# Patient Record
Sex: Female | Born: 1960 | Race: White | Hispanic: No | Smoking: Current every day smoker
Health system: Southern US, Community
[De-identification: ages and names within clinical notes are randomized; demographics above are authoritative.]

## PROBLEM LIST (undated history)

## (undated) DIAGNOSIS — M791 Myalgia, unspecified site: Secondary | ICD-10-CM

## (undated) DIAGNOSIS — S71131A Puncture wound without foreign body, right thigh, initial encounter: Secondary | ICD-10-CM

## (undated) DIAGNOSIS — F17200 Nicotine dependence, unspecified, uncomplicated: Secondary | ICD-10-CM

## (undated) DIAGNOSIS — R319 Hematuria, unspecified: Secondary | ICD-10-CM

## (undated) DIAGNOSIS — B019 Varicella without complication: Secondary | ICD-10-CM

## (undated) DIAGNOSIS — F329 Major depressive disorder, single episode, unspecified: Secondary | ICD-10-CM

## (undated) DIAGNOSIS — J019 Acute sinusitis, unspecified: Secondary | ICD-10-CM

## (undated) DIAGNOSIS — R946 Abnormal results of thyroid function studies: Secondary | ICD-10-CM

## (undated) DIAGNOSIS — M25551 Pain in right hip: Secondary | ICD-10-CM

## (undated) DIAGNOSIS — T7840XA Allergy, unspecified, initial encounter: Secondary | ICD-10-CM

## (undated) DIAGNOSIS — Z0000 Encounter for general adult medical examination without abnormal findings: Secondary | ICD-10-CM

## (undated) DIAGNOSIS — Z86010 Personal history of colonic polyps: Secondary | ICD-10-CM

## (undated) DIAGNOSIS — R51 Headache: Secondary | ICD-10-CM

## (undated) DIAGNOSIS — E785 Hyperlipidemia, unspecified: Secondary | ICD-10-CM

## (undated) DIAGNOSIS — F419 Anxiety disorder, unspecified: Secondary | ICD-10-CM

## (undated) DIAGNOSIS — C801 Malignant (primary) neoplasm, unspecified: Secondary | ICD-10-CM

## (undated) DIAGNOSIS — M542 Cervicalgia: Secondary | ICD-10-CM

## (undated) DIAGNOSIS — T148XXA Other injury of unspecified body region, initial encounter: Secondary | ICD-10-CM

## (undated) DIAGNOSIS — N200 Calculus of kidney: Secondary | ICD-10-CM

## (undated) DIAGNOSIS — I7300 Raynaud's syndrome without gangrene: Secondary | ICD-10-CM

## (undated) DIAGNOSIS — F32A Depression, unspecified: Secondary | ICD-10-CM

## (undated) DIAGNOSIS — L089 Local infection of the skin and subcutaneous tissue, unspecified: Secondary | ICD-10-CM

## (undated) DIAGNOSIS — F418 Other specified anxiety disorders: Secondary | ICD-10-CM

## (undated) HISTORY — DX: Headache: R51

## (undated) HISTORY — DX: Hematuria, unspecified: R31.9

## (undated) HISTORY — DX: Pain in right hip: M25.551

## (undated) HISTORY — DX: Personal history of colonic polyps: Z86.010

## (undated) HISTORY — DX: Depression, unspecified: F32.A

## (undated) HISTORY — DX: Encounter for general adult medical examination without abnormal findings: Z00.00

## (undated) HISTORY — DX: Puncture wound without foreign body, right thigh, initial encounter: S71.131A

## (undated) HISTORY — DX: Calculus of kidney: N20.0

## (undated) HISTORY — DX: Raynaud's syndrome without gangrene: I73.00

## (undated) HISTORY — DX: Major depressive disorder, single episode, unspecified: F32.9

## (undated) HISTORY — DX: Allergy, unspecified, initial encounter: T78.40XA

## (undated) HISTORY — DX: Anxiety disorder, unspecified: F41.9

## (undated) HISTORY — DX: Varicella without complication: B01.9

## (undated) HISTORY — DX: Other specified anxiety disorders: F41.8

## (undated) HISTORY — PX: POLYPECTOMY: SHX149

## (undated) HISTORY — PX: CARPAL TUNNEL RELEASE: SHX101

## (undated) HISTORY — DX: Acute sinusitis, unspecified: J01.90

## (undated) HISTORY — DX: Hyperlipidemia, unspecified: E78.5

## (undated) HISTORY — DX: Malignant (primary) neoplasm, unspecified: C80.1

## (undated) HISTORY — DX: Nicotine dependence, unspecified, uncomplicated: F17.200

## (undated) HISTORY — DX: Myalgia, unspecified site: M79.10

## (undated) HISTORY — PX: LITHOTRIPSY: SUR834

## (undated) HISTORY — DX: Cervicalgia: M54.2

## (undated) HISTORY — DX: Abnormal results of thyroid function studies: R94.6

## (undated) HISTORY — DX: Local infection of the skin and subcutaneous tissue, unspecified: L08.9

## (undated) HISTORY — DX: Other injury of unspecified body region, initial encounter: T14.8XXA

---

## 2000-08-10 ENCOUNTER — Encounter: Payer: Self-pay | Admitting: Urology

## 2000-08-12 ENCOUNTER — Encounter: Payer: Self-pay | Admitting: Urology

## 2000-08-12 ENCOUNTER — Ambulatory Visit (HOSPITAL_COMMUNITY): Admission: RE | Admit: 2000-08-12 | Discharge: 2000-08-12 | Payer: Self-pay | Admitting: Urology

## 2003-02-02 ENCOUNTER — Encounter: Payer: Self-pay | Admitting: Family Medicine

## 2003-02-02 ENCOUNTER — Encounter: Admission: RE | Admit: 2003-02-02 | Discharge: 2003-02-02 | Payer: Self-pay | Admitting: Family Medicine

## 2003-02-19 ENCOUNTER — Encounter: Admission: RE | Admit: 2003-02-19 | Discharge: 2003-02-19 | Payer: Self-pay | Admitting: Family Medicine

## 2003-02-19 ENCOUNTER — Encounter: Payer: Self-pay | Admitting: Family Medicine

## 2004-08-01 ENCOUNTER — Encounter: Admission: RE | Admit: 2004-08-01 | Discharge: 2004-08-01 | Payer: Self-pay | Admitting: Obstetrics and Gynecology

## 2004-08-01 ENCOUNTER — Other Ambulatory Visit: Admission: RE | Admit: 2004-08-01 | Discharge: 2004-08-01 | Payer: Self-pay | Admitting: Obstetrics and Gynecology

## 2004-12-11 ENCOUNTER — Ambulatory Visit (HOSPITAL_COMMUNITY): Admission: RE | Admit: 2004-12-11 | Discharge: 2004-12-11 | Payer: Self-pay | Admitting: Gastroenterology

## 2004-12-11 ENCOUNTER — Encounter (INDEPENDENT_AMBULATORY_CARE_PROVIDER_SITE_OTHER): Payer: Self-pay | Admitting: Specialist

## 2005-01-01 LAB — HM COLONOSCOPY

## 2005-10-02 ENCOUNTER — Encounter: Admission: RE | Admit: 2005-10-02 | Discharge: 2005-10-02 | Payer: Self-pay | Admitting: Obstetrics and Gynecology

## 2005-10-30 ENCOUNTER — Other Ambulatory Visit: Admission: RE | Admit: 2005-10-30 | Discharge: 2005-10-30 | Payer: Self-pay | Admitting: Obstetrics and Gynecology

## 2007-09-07 ENCOUNTER — Encounter: Admission: RE | Admit: 2007-09-07 | Discharge: 2007-09-07 | Payer: Self-pay | Admitting: Obstetrics and Gynecology

## 2007-10-04 ENCOUNTER — Other Ambulatory Visit: Admission: RE | Admit: 2007-10-04 | Discharge: 2007-10-04 | Payer: Self-pay | Admitting: Obstetrics & Gynecology

## 2008-06-06 ENCOUNTER — Encounter: Admission: RE | Admit: 2008-06-06 | Discharge: 2008-06-06 | Payer: Self-pay | Admitting: Otolaryngology

## 2008-10-31 ENCOUNTER — Encounter: Admission: RE | Admit: 2008-10-31 | Discharge: 2008-10-31 | Payer: Self-pay | Admitting: Obstetrics and Gynecology

## 2008-10-31 ENCOUNTER — Other Ambulatory Visit: Admission: RE | Admit: 2008-10-31 | Discharge: 2008-10-31 | Payer: Self-pay | Admitting: Obstetrics and Gynecology

## 2009-12-09 ENCOUNTER — Encounter: Admission: RE | Admit: 2009-12-09 | Discharge: 2009-12-09 | Payer: Self-pay | Admitting: Obstetrics and Gynecology

## 2009-12-26 ENCOUNTER — Ambulatory Visit (HOSPITAL_BASED_OUTPATIENT_CLINIC_OR_DEPARTMENT_OTHER): Admission: RE | Admit: 2009-12-26 | Discharge: 2009-12-26 | Payer: Self-pay | Admitting: Orthopedic Surgery

## 2010-02-18 ENCOUNTER — Encounter (INDEPENDENT_AMBULATORY_CARE_PROVIDER_SITE_OTHER): Payer: Self-pay | Admitting: *Deleted

## 2010-06-19 ENCOUNTER — Ambulatory Visit (HOSPITAL_BASED_OUTPATIENT_CLINIC_OR_DEPARTMENT_OTHER): Admission: RE | Admit: 2010-06-19 | Discharge: 2010-06-19 | Payer: Self-pay | Admitting: Orthopedic Surgery

## 2010-09-02 NOTE — Letter (Signed)
Summary: Previsit letter  [facility]  [address] [place], [place] [address]   Phone: [phone]  Fax: [phone]       02/18/2010 MRN: [id number]  Autumn Myers [address] [place], [place]  [address]  Dear [name],  Welcome to the Gastroenterology Division at [facility].    You are scheduled to see a nurse for your pre-procedure visit on 04/11/2010 at 4:00pm on the 3rd floor at [facility], [address]  We ask that you try to arrive at our office 15 minutes prior to your appointment time to allow for check-in.  Your nurse visit will consist of discussing your medical and surgical history, your immediate family medical history, and your medications.    Please bring a complete list of all your medications or, if you prefer, bring the medication bottles and we will list them.  We will need to be aware of both prescribed and over the counter drugs.  We will need to know exact dosage information as well.  If you are on blood thinners (Coumadin, Plavix, Aggrenox, Ticlid, etc.) please call our office today/prior to your appointment, as we need to consult with your physician about holding your medication.   Please be prepared to read and sign documents such as consent forms, a financial agreement, and acknowledgement forms.  If necessary, and with your consent, a friend or relative is welcome to sit-in on the nurse visit with you.  Please bring your insurance card so that we may make a copy of it.  If your insurance requires a referral to see a specialist, please bring your referral form from your primary care physician.  No co-pay is required for this nurse visit.     If you cannot keep your appointment, please call [phone] to cancel or reschedule prior to your appointment date.  This allows [place] the opportunity to schedule an appointment for another patient in need of care.    Thank you for choosing [facility] for your medical  needs.  We appreciate the opportunity to care for you.  Please visit [place] at our website  to learn more about our practice.                     Sincerely.                                                                                                                   The Gastroenterology Division

## 2010-10-20 LAB — POCT HEMOGLOBIN-HEMACUE: Hemoglobin: 14.2 g/dL (ref 12.0–15.0)

## 2010-12-19 NOTE — Op Note (Signed)
NAME:  Autumn Myers, Autumn Myers NO.:  [id number]   MEDICAL RECORD NO.:  [id number]          PATIENT TYPE:  AMB   LOCATION:  ENDO                         FACILITY:  [facility]   PHYSICIAN:  Graylin Shiver, M.D.   DATE OF BIRTH:  04/12/1961   DATE OF PROCEDURE:  12/11/2004  DATE OF DISCHARGE:                                 OPERATIVE REPORT   PROCEDURE:  Esophagogastroduodenoscopy with biopsy.   ENDOSCOPIST:  Graylin Shiver, M.D.   INDICATIONS:  Epigastric pain.  Multiple tests have been done which have  been unrevealing. Endoscopy is being done to further evaluate.   INFORMED CONSENT:  Obtained after explanation of the risks of bleeding,  infection, and perforation.   PREMEDICATION:  Fentanyl 75 mcg intravenously, Versed 7.5 mg intravenously.   PROCEDURE IN DETAIL:  With the patient in the left lateral decubitus  position, the Olympus gastroscope was inserted into the oropharynx and  passed into the esophagus. It was advanced down the esophagus, then into the  stomach and into the duodenum. The second portion and bulb of the duodenum  looked normal. The stomach showed a diffuse, mild erythematous appearance to  the mucosa compatible with gastritis. No ulcers or erosions were seen.  Biopsies were obtained from the stomach for histology. No lesions were seen  in the fundus or cardia on retroflexion. The esophagus looked normal. The  esophagogastric junction was at 37 cm. She tolerated the procedure well  without complications.   IMPRESSION:  Gastritis.   PLAN:  The biopsies will be checked. If Helicobacter pylori is present, it  will be treated.      SFG/MEDQ  D:  12/11/2004  T:  12/11/2004  Job:  [id number]

## 2010-12-26 ENCOUNTER — Other Ambulatory Visit: Payer: Self-pay | Admitting: Obstetrics and Gynecology

## 2010-12-26 DIAGNOSIS — Z1231 Encounter for screening mammogram for malignant neoplasm of breast: Secondary | ICD-10-CM

## 2011-01-02 LAB — HM MAMMOGRAPHY

## 2011-01-19 ENCOUNTER — Other Ambulatory Visit: Payer: Self-pay | Admitting: Dermatology

## 2011-01-26 ENCOUNTER — Ambulatory Visit
Admission: RE | Admit: 2011-01-26 | Discharge: 2011-01-26 | Disposition: A | Discharge status: Home / Self Care | Payer: BC Managed Care – PPO | Source: Ambulatory Visit | Attending: Obstetrics and Gynecology | Admitting: Obstetrics and Gynecology

## 2011-01-26 DIAGNOSIS — Z1231 Encounter for screening mammogram for malignant neoplasm of breast: Secondary | ICD-10-CM

## 2011-05-15 ENCOUNTER — Inpatient Hospital Stay (INDEPENDENT_AMBULATORY_CARE_PROVIDER_SITE_OTHER)
Admission: RE | Admit: 2011-05-15 | Discharge: 2011-05-15 | Disposition: A | Discharge status: Home / Self Care | Payer: Self-pay | Source: Ambulatory Visit | Attending: Family Medicine | Admitting: Family Medicine

## 2011-05-15 ENCOUNTER — Encounter: Payer: Self-pay | Admitting: Family Medicine

## 2011-05-15 DIAGNOSIS — Z202 Contact with and (suspected) exposure to infections with a predominantly sexual mode of transmission: Secondary | ICD-10-CM

## 2011-05-15 DIAGNOSIS — S60229A Contusion of unspecified hand, initial encounter: Secondary | ICD-10-CM

## 2011-07-06 NOTE — Progress Notes (Signed)
Summary: POSS TRICHOMONAS...WSE (room 4)   Vital Signs:  Patient Profile:   50 Years Old Female CC:      test for Trichomoniasis Height:     61 inches Weight:      106 pounds O2 Sat:      100 % O2 treatment:    Room Air Temp:     98.1 degrees F oral Pulse rate:   78 / minute Resp:     16 per minute BP sitting:   117 / 78  (left arm)  Pt. in pain?   no  Vitals Entered By: Lavell Islam RN (May 15, 2011 5:16 PM)                   Updated Prior Medication List: * CITALOPRAM HYDROBROMIDE 20 MG TABS (CITALOPRAM HYDROBROMIDE)  VIIBRYD 10 MG TABS (VILAZODONE HCL)  CYCLOBENZAPRINE HCL 10 MG TABS (CYCLOBENZAPRINE HCL) 2-3 times per day * GABAPENTIN 100 MG/5ML SOLN (GABAPENTIN) 1-2 3-4 times per day * VITAMIN D daily  Current Allergies: ! PCN ! * SHELLFISH, ! * NEOSPHistory of Present Illness Chief Complaint: test for Trichomoniasis History of Present Illness:  Subjective:  Patient presents with two problems:   1)  Her new sexual partner has just been diagnosed with trichomonas.  Other STD tests were negative.  She has no symptoms whatsoever, but would like to be treated. 2)  She bumped the dorsum of her right hand 2 days ago and has persistent mild swelling.  She has also had mild tingling in her right 4th and 5th fingers.  REVIEW OF SYSTEMS Constitutional Symptoms      Denies fever, chills, night sweats, weight loss, weight gain, and fatigue.  Eyes       Denies change in vision, eye pain, eye discharge, glasses, contact lenses, and eye surgery. Ear/Nose/Throat/Mouth       Denies hearing loss/aids, change in hearing, ear pain, ear discharge, dizziness, frequent runny nose, frequent nose bleeds, sinus problems, sore throat, hoarseness, and tooth pain or bleeding.  Respiratory       Denies dry cough, productive cough, wheezing, shortness of breath, asthma, bronchitis, and emphysema/COPD.  Cardiovascular       Denies murmurs, chest pain, and tires easily with exhertion.     Gastrointestinal       Denies stomach pain, nausea/vomiting, diarrhea, constipation, blood in bowel movements, and indigestion. Genitourniary       Denies painful urination, kidney stones, and loss of urinary control. Neurological       Denies paralysis, seizures, and fainting/blackouts. Musculoskeletal       Denies muscle pain, joint pain, joint stiffness, decreased range of motion, redness, swelling, muscle weakness, and gout.  Skin       Denies bruising, unusual mles/lumps or sores, and hair/skin or nail changes.  Psych       Denies mood changes, temper/anger issues, anxiety/stress, speech problems, depression, and sleep problems. Other Comments: asymptomatic; desires test for Trich   Past History:  Past Medical History: back pain  Past Surgical History: carpal tunnel both wrist  Family History: unremarkable  Social History: Current Smoker Alcohol use-no Drug use-no Smoking Status:  current Drug Use:  no   Objective:  Appearance:  Patient appears healthy, stated age, and in no acute distress  Right wrist and hand:  full range of motion all joints.  The dorsum of hand has mild swelling and resolving ecchymosis.  Minimal tenderness.  No deformity.  All fingers have full range of motion.  Distal neurovascular intact  Assessment New Problems: CONTUSION, RIGHT HAND (ICD-923.20) SEXUALLY TRANSMITTED DISEASE, EXPOSURE TO (ICD-V01.6)   Plan New Medications/Changes: METRONIDAZOLE 500 MG TABS (METRONIDAZOLE) One by mouth bid  #14 x 0, 05/15/2011, Donna Christen MD  New Orders: New Patient Level III [phone] Ace  Bandage < 3in. [J8119] Planning Comments:   Begin Flagyl for one week. Ace wrap applied to right hand and wear for several days until swelling resolved.  Continue applying ice pack several times daily.  Begin hand range of motion exercises when improved.  If pain in hand persists one week, return for x-ray.   The patient and/or caregiver has been counseled  thoroughly with regard to medications prescribed including dosage, schedule, interactions, rationale for use, and possible side effects and they verbalize understanding.  Diagnoses and expected course of recovery discussed and will return if not improved as expected or if the condition worsens. Patient and/or caregiver verbalized understanding.  Prescriptions: METRONIDAZOLE 500 MG TABS (METRONIDAZOLE) One by mouth bid  #14 x 0   Entered and Authorized by:   Donna Christen MD   Signed by:   Donna Christen MD on 05/15/2011   Method used:   Print then Give to Patient   RxID:   [id number]   Orders Added: 1)  New Patient Level III [84696] 2)  Ace  Bandage < 3in. [E9528]

## 2011-10-13 ENCOUNTER — Encounter: Payer: Self-pay | Admitting: Family Medicine

## 2011-10-13 ENCOUNTER — Ambulatory Visit (INDEPENDENT_AMBULATORY_CARE_PROVIDER_SITE_OTHER): Payer: BC Managed Care – PPO | Admitting: Family Medicine

## 2011-10-13 VITALS — BP 98/63 | HR 68 | Temp 98.8°F | Ht 61.0 in | Wt 115.4 lb

## 2011-10-13 DIAGNOSIS — E559 Vitamin D deficiency, unspecified: Secondary | ICD-10-CM | POA: Insufficient documentation

## 2011-10-13 DIAGNOSIS — F418 Other specified anxiety disorders: Secondary | ICD-10-CM

## 2011-10-13 DIAGNOSIS — F341 Dysthymic disorder: Secondary | ICD-10-CM

## 2011-10-13 DIAGNOSIS — Z0000 Encounter for general adult medical examination without abnormal findings: Secondary | ICD-10-CM | POA: Insufficient documentation

## 2011-10-13 DIAGNOSIS — F17200 Nicotine dependence, unspecified, uncomplicated: Secondary | ICD-10-CM | POA: Insufficient documentation

## 2011-10-13 DIAGNOSIS — T7840XA Allergy, unspecified, initial encounter: Secondary | ICD-10-CM

## 2011-10-13 DIAGNOSIS — F419 Anxiety disorder, unspecified: Secondary | ICD-10-CM | POA: Insufficient documentation

## 2011-10-13 HISTORY — DX: Encounter for general adult medical examination without abnormal findings: Z00.00

## 2011-10-13 MED ORDER — LORATADINE 10 MG PO TABS: 10.0000 mg | ORAL_TABLET | Freq: Every day | ORAL | Status: DC

## 2011-10-13 NOTE — Assessment & Plan Note (Signed)
Request old records. Will repeat labs as needed follows with an ob/gyn but can switch care here if she chooses

## 2011-10-13 NOTE — Assessment & Plan Note (Signed)
Encouraged complete cessation, patient willing to start trying, has used ecig in past with some success

## 2011-10-13 NOTE — Patient Instructions (Signed)
Preventive Care for Adults, Female A healthy lifestyle and preventive care can promote health and wellness. Preventive health guidelines for women include the following key practices.  A routine yearly physical is a good way to check with your caregiver about your health and preventive screening. It is a chance to share any concerns and updates on your health, and to receive a thorough exam.   Visit your dentist for a routine exam and preventive care every 6 months. Brush your teeth twice a day and floss once a day. Good oral hygiene prevents tooth decay and gum disease.   The frequency of eye exams is based on your age, health, family medical history, use of contact lenses, and other factors. Follow your caregiver's recommendations for frequency of eye exams.   Eat a healthy diet. Foods like vegetables, fruits, whole grains, low-fat dairy products, and lean protein foods contain the nutrients you need without too many calories. Decrease your intake of foods high in solid fats, added sugars, and salt. Eat the right amount of calories for you.Get information about a proper diet from your caregiver, if necessary.   Regular physical exercise is one of the most important things you can do for your health. Most adults should get at least 150 minutes of moderate-intensity exercise (any activity that increases your heart rate and causes you to sweat) each week. In addition, most adults need muscle-strengthening exercises on 2 or more days a week.   Maintain a healthy weight. The body mass index (BMI) is a screening tool to identify possible weight problems. It provides an estimate of body fat based on height and weight. Your caregiver can help determine your BMI, and can help you achieve or maintain a healthy weight.For adults 20 years and older:   A BMI below 18.5 is considered underweight.   A BMI of 18.5 to 24.9 is normal.   A BMI of 25 to 29.9 is considered overweight.   A BMI of 30 and above is  considered obese.   Maintain normal blood lipids and cholesterol levels by exercising and minimizing your intake of saturated fat. Eat a balanced diet with plenty of fruit and vegetables. Blood tests for lipids and cholesterol should begin at age 20 and be repeated every 5 years. If your lipid or cholesterol levels are high, you are over 50, or you are at high risk for heart disease, you may need your cholesterol levels checked more frequently.Ongoing high lipid and cholesterol levels should be treated with medicines if diet and exercise are not effective.   If you smoke, find out from your caregiver how to quit. If you do not use tobacco, do not start.   If you are pregnant, do not drink alcohol. If you are breastfeeding, be very cautious about drinking alcohol. If you are not pregnant and choose to drink alcohol, do not exceed 1 drink per day. One drink is considered to be 12 ounces (355 mL) of beer, 5 ounces (148 mL) of wine, or 1.5 ounces (44 mL) of liquor.   Avoid use of street drugs. Do not share needles with anyone. Ask for help if you need support or instructions about stopping the use of drugs.   High blood pressure causes heart disease and increases the risk of stroke. Your blood pressure should be checked at least every 1 to 2 years. Ongoing high blood pressure should be treated with medicines if weight loss and exercise are not effective.   If you are 55 to 50   years old, ask your caregiver if you should take aspirin to prevent strokes.   Diabetes screening involves taking a blood sample to check your fasting blood sugar level. This should be done once every 3 years, after age 45, if you are within normal weight and without risk factors for diabetes. Testing should be considered at a younger age or be carried out more frequently if you are overweight and have at least 1 risk factor for diabetes.   Breast cancer screening is essential preventive care for women. You should practice "breast  self-awareness." This means understanding the normal appearance and feel of your breasts and may include breast self-examination. Any changes detected, no matter how small, should be reported to a caregiver. Women in their 20s and 30s should have a clinical breast exam (CBE) by a caregiver as part of a regular health exam every 1 to 3 years. After age 40, women should have a CBE every year. Starting at age 40, women should consider having a mammography (breast X-ray test) every year. Women who have a family history of breast cancer should talk to their caregiver about genetic screening. Women at a high risk of breast cancer should talk to their caregivers about having magnetic resonance imaging (MRI) and a mammography every year.   The Pap test is a screening test for cervical cancer. A Pap test can show cell changes on the cervix that might become cervical cancer if left untreated. A Pap test is a procedure in which cells are obtained and examined from the lower end of the uterus (cervix).   Women should have a Pap test starting at age 21.   Between ages 21 and 29, Pap tests should be repeated every 2 years.   Beginning at age 30, you should have a Pap test every 3 years as long as the past 3 Pap tests have been normal.   Some women have medical problems that increase the chance of getting cervical cancer. Talk to your caregiver about these problems. It is especially important to talk to your caregiver if a new problem develops soon after your last Pap test. In these cases, your caregiver may recommend more frequent screening and Pap tests.   The above recommendations are the same for women who have or have not gotten the vaccine for human papillomavirus (HPV).   If you had a hysterectomy for a problem that was not cancer or a condition that could lead to cancer, then you no longer need Pap tests. Even if you no longer need a Pap test, a regular exam is a good idea to make sure no other problems are  starting.   If you are between ages 65 and 70, and you have had normal Pap tests going back 10 years, you no longer need Pap tests. Even if you no longer need a Pap test, a regular exam is a good idea to make sure no other problems are starting.   If you have had past treatment for cervical cancer or a condition that could lead to cancer, you need Pap tests and screening for cancer for at least 20 years after your treatment.   If Pap tests have been discontinued, risk factors (such as a new sexual partner) need to be reassessed to determine if screening should be resumed.   The HPV test is an additional test that may be used for cervical cancer screening. The HPV test looks for the virus that can cause the cell changes on the cervix.   The cells collected during the Pap test can be tested for HPV. The HPV test could be used to screen women aged 30 years and older, and should be used in women of any age who have unclear Pap test results. After the age of 30, women should have HPV testing at the same frequency as a Pap test.   Colorectal cancer can be detected and often prevented. Most routine colorectal cancer screening begins at the age of 50 and continues through age 75. However, your caregiver may recommend screening at an earlier age if you have risk factors for colon cancer. On a yearly basis, your caregiver may provide home test kits to check for hidden blood in the stool. Use of a small camera at the end of a tube, to directly examine the colon (sigmoidoscopy or colonoscopy), can detect the earliest forms of colorectal cancer. Talk to your caregiver about this at age 50, when routine screening begins. Direct examination of the colon should be repeated every 5 to 10 years through age 75, unless early forms of pre-cancerous polyps or small growths are found.   Hepatitis C blood testing is recommended for all people born from 1945 through 1965 and any individual with known risks for hepatitis C.    Practice safe sex. Use condoms and avoid high-risk sexual practices to reduce the spread of sexually transmitted infections (STIs). STIs include gonorrhea, chlamydia, syphilis, trichomonas, herpes, HPV, and human immunodeficiency virus (HIV). Herpes, HIV, and HPV are viral illnesses that have no cure. They can result in disability, cancer, and death. Sexually active women aged 25 and younger should be checked for chlamydia. Older women with new or multiple partners should also be tested for chlamydia. Testing for other STIs is recommended if you are sexually active and at increased risk.   Osteoporosis is a disease in which the bones lose minerals and strength with aging. This can result in serious bone fractures. The risk of osteoporosis can be identified using a bone density scan. Women ages 65 and over and women at risk for fractures or osteoporosis should discuss screening with their caregivers. Ask your caregiver whether you should take a calcium supplement or vitamin D to reduce the rate of osteoporosis.   Menopause can be associated with physical symptoms and risks. Hormone replacement therapy is available to decrease symptoms and risks. You should talk to your caregiver about whether hormone replacement therapy is right for you.   Use sunscreen with sun protection factor (SPF) of 30 or more. Apply sunscreen liberally and repeatedly throughout the day. You should seek shade when your shadow is shorter than you. Protect yourself by wearing long sleeves, pants, a wide-brimmed hat, and sunglasses year round, whenever you are outdoors.   Once a month, do a whole body skin exam, using a mirror to look at the skin on your back. Notify your caregiver of new moles, moles that have irregular borders, moles that are larger than a pencil eraser, or moles that have changed in shape or color.   Stay current with required immunizations.   Influenza. You need a dose every fall (or winter). The composition of  the flu vaccine changes each year, so being vaccinated once is not enough.   Pneumococcal polysaccharide. You need 1 to 2 doses if you smoke cigarettes or if you have certain chronic medical conditions. You need 1 dose at age 65 (or older) if you have never been vaccinated.   Tetanus, diphtheria, pertussis (Tdap, Td). Get 1 dose of   Tdap vaccine if you are younger than age 65, are over 65 and have contact with an infant, are a healthcare worker, are pregnant, or simply want to be protected from whooping cough. After that, you need a Td booster dose every 10 years. Consult your caregiver if you have not had at least 3 tetanus and diphtheria-containing shots sometime in your life or have a deep or dirty wound.   HPV. You need this vaccine if you are a woman age 26 or younger. The vaccine is given in 3 doses over 6 months.   Measles, mumps, rubella (MMR). You need at least 1 dose of MMR if you were born in 1957 or later. You may also need a second dose.   Meningococcal. If you are age 19 to 21 and a first-year college student living in a residence hall, or have one of several medical conditions, you need to get vaccinated against meningococcal disease. You may also need additional booster doses.   Zoster (shingles). If you are age 60 or older, you should get this vaccine.   Varicella (chickenpox). If you have never had chickenpox or you were vaccinated but received only 1 dose, talk to your caregiver to find out if you need this vaccine.   Hepatitis A. You need this vaccine if you have a specific risk factor for hepatitis A virus infection or you simply wish to be protected from this disease. The vaccine is usually given as 2 doses, 6 to 18 months apart.   Hepatitis B. You need this vaccine if you have a specific risk factor for hepatitis B virus infection or you simply wish to be protected from this disease. The vaccine is given in 3 doses, usually over 6 months.  Preventive Services /  Frequency Ages 19 to 39  Blood pressure check.** / Every 1 to 2 years.   Lipid and cholesterol check.** / Every 5 years beginning at age 20.   Clinical breast exam.** / Every 3 years for women in their 20s and 30s.   Pap test.** / Every 2 years from ages 21 through 29. Every 3 years starting at age 30 through age 65 or 70 with a history of 3 consecutive normal Pap tests.   HPV screening.** / Every 3 years from ages 30 through ages 65 to 70 with a history of 3 consecutive normal Pap tests.   Hepatitis C blood test.** / For any individual with known risks for hepatitis C.   Skin self-exam. / Monthly.   Influenza immunization.** / Every year.   Pneumococcal polysaccharide immunization.** / 1 to 2 doses if you smoke cigarettes or if you have certain chronic medical conditions.   Tetanus, diphtheria, pertussis (Tdap, Td) immunization. / A one-time dose of Tdap vaccine. After that, you need a Td booster dose every 10 years.   HPV immunization. / 3 doses over 6 months, if you are 26 and younger.   Measles, mumps, rubella (MMR) immunization. / You need at least 1 dose of MMR if you were born in 1957 or later. You may also need a second dose.   Meningococcal immunization. / 1 dose if you are age 19 to 21 and a first-year college student living in a residence hall, or have one of several medical conditions, you need to get vaccinated against meningococcal disease. You may also need additional booster doses.   Varicella immunization.** / Consult your caregiver.   Hepatitis A immunization.** / Consult your caregiver. 2 doses, 6 to 18 months   apart.   Hepatitis B immunization.** / Consult your caregiver. 3 doses usually over 6 months.  Ages 40 to 64  Blood pressure check.** / Every 1 to 2 years.   Lipid and cholesterol check.** / Every 5 years beginning at age 20.   Clinical breast exam.** / Every year after age 40.   Mammogram.** / Every year beginning at age 40 and continuing for as  long as you are in good health. Consult with your caregiver.   Pap test.** / Every 3 years starting at age 30 through age 65 or 70 with a history of 3 consecutive normal Pap tests.   HPV screening.** / Every 3 years from ages 30 through ages 65 to 70 with a history of 3 consecutive normal Pap tests.   Fecal occult blood test (FOBT) of stool. / Every year beginning at age 50 and continuing until age 75. You may not need to do this test if you get a colonoscopy every 10 years.   Flexible sigmoidoscopy or colonoscopy.** / Every 5 years for a flexible sigmoidoscopy or every 10 years for a colonoscopy beginning at age 50 and continuing until age 75.   Hepatitis C blood test.** / For all people born from 1945 through 1965 and any individual with known risks for hepatitis C.   Skin self-exam. / Monthly.   Influenza immunization.** / Every year.   Pneumococcal polysaccharide immunization.** / 1 to 2 doses if you smoke cigarettes or if you have certain chronic medical conditions.   Tetanus, diphtheria, pertussis (Tdap, Td) immunization.** / A one-time dose of Tdap vaccine. After that, you need a Td booster dose every 10 years.   Measles, mumps, rubella (MMR) immunization. / You need at least 1 dose of MMR if you were born in 1957 or later. You may also need a second dose.   Varicella immunization.** / Consult your caregiver.   Meningococcal immunization.** / Consult your caregiver.   Hepatitis A immunization.** / Consult your caregiver. 2 doses, 6 to 18 months apart.   Hepatitis B immunization.** / Consult your caregiver. 3 doses, usually over 6 months.  Ages 65 and over  Blood pressure check.** / Every 1 to 2 years.   Lipid and cholesterol check.** / Every 5 years beginning at age 20.   Clinical breast exam.** / Every year after age 40.   Mammogram.** / Every year beginning at age 40 and continuing for as long as you are in good health. Consult with your caregiver.   Pap test.** /  Every 3 years starting at age 30 through age 65 or 70 with a 3 consecutive normal Pap tests. Testing can be stopped between 65 and 70 with 3 consecutive normal Pap tests and no abnormal Pap or HPV tests in the past 10 years.   HPV screening.** / Every 3 years from ages 30 through ages 65 or 70 with a history of 3 consecutive normal Pap tests. Testing can be stopped between 65 and 70 with 3 consecutive normal Pap tests and no abnormal Pap or HPV tests in the past 10 years.   Fecal occult blood test (FOBT) of stool. / Every year beginning at age 50 and continuing until age 75. You may not need to do this test if you get a colonoscopy every 10 years.   Flexible sigmoidoscopy or colonoscopy.** / Every 5 years for a flexible sigmoidoscopy or every 10 years for a colonoscopy beginning at age 50 and continuing until age 75.   Hepatitis   C blood test.** / For all people born from 1945 through 1965 and any individual with known risks for hepatitis C.   Osteoporosis screening.** / A one-time screening for women ages 65 and over and women at risk for fractures or osteoporosis.   Skin self-exam. / Monthly.   Influenza immunization.** / Every year.   Pneumococcal polysaccharide immunization.** / 1 dose at age 65 (or older) if you have never been vaccinated.   Tetanus, diphtheria, pertussis (Tdap, Td) immunization. / A one-time dose of Tdap vaccine if you are over 65 and have contact with an infant, are a healthcare worker, or simply want to be protected from whooping cough. After that, you need a Td booster dose every 10 years.   Varicella immunization.** / Consult your caregiver.   Meningococcal immunization.** / Consult your caregiver.   Hepatitis A immunization.** / Consult your caregiver. 2 doses, 6 to 18 months apart.   Hepatitis B immunization.** / Check with your caregiver. 3 doses, usually over 6 months.  ** Family history and personal history of risk and conditions may change your caregiver's  recommendations. Document Released: 09/15/2001 Document Revised: 07/09/2011 Document Reviewed: 12/15/2010 [employer] Patient Information 2012 [employer]. 

## 2011-10-13 NOTE — Assessment & Plan Note (Signed)
Request old records and continue current dosing of meds for now

## 2011-10-13 NOTE — Assessment & Plan Note (Signed)
Uses Gabapentin prn for anxiety attacks with good effect roughly once daily. Is happy with Viibryd except it causes diarrhea within an hour of taking it, try Imodium and fiber prn and consider changing back to Celexa which she has tolerated in the past if no improvement

## 2011-10-13 NOTE — Progress Notes (Signed)
Patient ID: Autumn Myers, female   DOB: 05-31-1961, 51 y.o.   MRN: [id number] LIDIA CLAVIJO [id number] [birth year] 10/13/2011      Progress Note New Patient  Subjective  Chief Complaint  Chief Complaint  Patient presents with  . Establish Care    new patient    HPI  Is a 51 year old Caucasian female who is in today for new patient appointment. She is febrile here with a divorce, weight loss and a lot of stressors. She has been seeing a psychiatrist Dr. Clovis Riley who has helped her to get her rings and depression under control but now she is leaving her practice and she needs a primary care doctor. Otherwise the patient reports she's generally doing all right. She had a bad root canal in her left jaw and she continues to have pain in her jaw as a result. She had a lot of vertigo and frequent meclizine and Zofran after the procedure but reports that is all improved now.  Past Medical History  Diagnosis Date  . Allergy   . Anxiety   . Depression   . Chicken pox as a child  . Vitamin d deficiency   . Smoker   . Depression with anxiety   . Preventative health care 10/13/2011    Past Surgical History  Procedure Date  . Carpal tunnel release     on right and left  . Cesarean section 1994    Family History  Problem Relation Age of Onset  . Hypertension Mother   . Stroke Father   . Leukemia Father   . Hypertension Father   . Cancer Father     leukemia  . Depression Sister   . Anxiety disorder Sister   . Heart disease Maternal Grandfather     History   Social History  . Marital Status: Legally Separated    Spouse Name: N/A    Number of Children: N/A  . Years of Education: N/A   Occupational History  . Not on file.   Social History Main Topics  . Smoking status: Current Everyday Smoker -- 1.0 packs/day for 15 years    Types: Cigarettes  . Smokeless tobacco: Never Used  . Alcohol Use: No  . Drug Use: No  . Sexually Active: Not Currently   Other Topics  Concern  . Not on file   Social History Narrative  . No narrative on file    No current outpatient prescriptions on file prior to visit.    Allergies  Allergen Reactions  . Neosporin (Triple Antibiotic)     Inflamed, itchy   . Penicillins     Review of Systems  Review of Systems  Constitutional: Negative for fever and malaise/fatigue.  HENT: Negative for congestion.   Eyes: Negative for discharge.  Respiratory: Negative for shortness of breath.   Cardiovascular: Negative for chest pain, palpitations and leg swelling.  Gastrointestinal: Negative for nausea, abdominal pain and diarrhea.  Genitourinary: Negative for dysuria.  Musculoskeletal: Negative for falls.  Skin: Negative for rash.  Neurological: Negative for loss of consciousness and headaches.  Endo/Heme/Allergies: Negative for polydipsia.  Psychiatric/Behavioral: Positive for depression. Negative for suicidal ideas. The patient is nervous/anxious. The patient does not have insomnia.     Objective  BP 98/63  Pulse 68  Temp(Src) 98.8 F (37.1 C) (Temporal)  Ht 5\' 1"  (1.549 m)  Wt 115 lb 6.4 oz (52.345 kg)  BMI 21.80 kg/m2  SpO2 99%  Physical Exam  Physical Exam  Constitutional: She  is oriented to person, place, and time and well-developed, well-nourished, and in no distress. No distress.  HENT:  Head: Normocephalic and atraumatic.  Right Ear: External ear normal.  Left Ear: External ear normal.  Nose: Nose normal.  Mouth/Throat: Oropharynx is clear and moist. No oropharyngeal exudate.  Eyes: Conjunctivae are normal. Pupils are equal, round, and reactive to light. Right eye exhibits no discharge. Left eye exhibits no discharge. No scleral icterus.  Neck: Normal range of motion. Neck supple. No thyromegaly present.  Cardiovascular: Normal rate, regular rhythm, normal heart sounds and intact distal pulses.   No murmur heard. Pulmonary/Chest: Effort normal and breath sounds normal. No respiratory distress.  She has no wheezes. She has no rales.  Abdominal: Soft. Bowel sounds are normal. She exhibits no distension and no mass. There is no tenderness.  Musculoskeletal: Normal range of motion. She exhibits no edema and no tenderness.  Lymphadenopathy:    She has no cervical adenopathy.  Neurological: She is alert and oriented to person, place, and time. She has normal reflexes. No cranial nerve deficit. Coordination normal.  Skin: Skin is warm and dry. No rash noted. She is not diaphoretic.  Psychiatric: Mood, memory and affect normal.       Assessment & Plan  Vitamin d deficiency Request old records and continue current dosing of meds for now  Depression with anxiety Uses Gabapentin prn for anxiety attacks with good effect roughly once daily. Is happy with Viibryd except it causes diarrhea within an hour of taking it, try Imodium and fiber prn and consider changing back to Celexa which she has tolerated in the past if no improvement  Smoker Encouraged complete cessation, patient willing to start trying, has used ecig in past with some success  Preventative health care Request old records. Will repeat labs as needed follows with an ob/gyn but can switch care here if she chooses

## 2011-11-17 ENCOUNTER — Ambulatory Visit: Payer: BC Managed Care – PPO | Admitting: Family Medicine

## 2011-11-20 ENCOUNTER — Encounter: Payer: Self-pay | Admitting: Family Medicine

## 2011-11-20 ENCOUNTER — Ambulatory Visit (INDEPENDENT_AMBULATORY_CARE_PROVIDER_SITE_OTHER): Payer: BC Managed Care – PPO | Admitting: Family Medicine

## 2011-11-20 DIAGNOSIS — F418 Other specified anxiety disorders: Secondary | ICD-10-CM

## 2011-11-20 DIAGNOSIS — M791 Myalgia, unspecified site: Secondary | ICD-10-CM

## 2011-11-20 DIAGNOSIS — E559 Vitamin D deficiency, unspecified: Secondary | ICD-10-CM

## 2011-11-20 DIAGNOSIS — IMO0001 Reserved for inherently not codable concepts without codable children: Secondary | ICD-10-CM

## 2011-11-20 DIAGNOSIS — R51 Headache: Secondary | ICD-10-CM

## 2011-11-20 DIAGNOSIS — T7840XA Allergy, unspecified, initial encounter: Secondary | ICD-10-CM

## 2011-11-20 DIAGNOSIS — F17200 Nicotine dependence, unspecified, uncomplicated: Secondary | ICD-10-CM

## 2011-11-20 DIAGNOSIS — F341 Dysthymic disorder: Secondary | ICD-10-CM

## 2011-11-20 DIAGNOSIS — Z0000 Encounter for general adult medical examination without abnormal findings: Secondary | ICD-10-CM

## 2011-11-20 DIAGNOSIS — Z79899 Other long term (current) drug therapy: Secondary | ICD-10-CM

## 2011-11-20 HISTORY — DX: Allergy, unspecified, initial encounter: T78.40XA

## 2011-11-20 HISTORY — DX: Headache: R51

## 2011-11-20 HISTORY — DX: Myalgia, unspecified site: M79.10

## 2011-11-20 LAB — RENAL FUNCTION PANEL
BUN: 9 mg/dL (ref 6–23)
Calcium: 9.2 mg/dL (ref 8.4–10.5)
Chloride: 104 mEq/L (ref 96–112)
Creatinine, Ser: 0.9 mg/dL (ref 0.4–1.2)
Glucose, Bld: 94 mg/dL (ref 70–99)
Phosphorus: 3.5 mg/dL (ref 2.3–4.6)
Potassium: 3.7 mEq/L (ref 3.5–5.1)
Sodium: 141 mEq/L (ref 135–145)

## 2011-11-20 LAB — HEPATIC FUNCTION PANEL
ALT: 14 U/L (ref 0–35)
Albumin: 4.4 g/dL (ref 3.5–5.2)
Bilirubin, Direct: 0 mg/dL (ref 0.0–0.3)
Total Protein: 6.7 g/dL (ref 6.0–8.3)

## 2011-11-20 LAB — CBC
HCT: 39.8 % (ref 36.0–46.0)
Hemoglobin: 13.2 g/dL (ref 12.0–15.0)
MCHC: 33.2 g/dL (ref 30.0–36.0)
MCV: 93.8 fl (ref 78.0–100.0)
RBC: 4.24 Mil/uL (ref 3.87–5.11)
RDW: 12.9 % (ref 11.5–14.6)

## 2011-11-20 LAB — TSH: TSH: 2.18 u[IU]/mL (ref 0.35–5.50)

## 2011-11-20 LAB — LIPID PANEL
LDL Cholesterol: 92 mg/dL (ref 0–99)
Total CHOL/HDL Ratio: 3

## 2011-11-20 NOTE — Assessment & Plan Note (Signed)
Doing well on Viibryd and is now working full time and she feels that helps tremendously, no changes, call as needed and return in 6 months

## 2011-11-20 NOTE — Assessment & Plan Note (Signed)
Skipped aclaritin one day last weekend and describes a migraine with photophobia, phonophobia, severe pain. As well as nausea. She took the Claritin-D Motrin, went to sleep and she woke up and it was gone. She is educated regarding migraine prophylaxis, needs good sleep good regular diet. Dehydration. She may try Excedrin Migraine as needed. If this becomes more frequent she needs to let us know.

## 2011-11-20 NOTE — Assessment & Plan Note (Signed)
Is now down to 1/2 ppd, is encouraged to attempt complete cessation and is given some tricks for trying to achieve this, she commits to trying

## 2011-11-20 NOTE — Assessment & Plan Note (Signed)
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

## 2011-11-20 NOTE — Assessment & Plan Note (Signed)
Check fasting labs today. 

## 2011-11-20 NOTE — Assessment & Plan Note (Signed)
Taking Claritin D 1 daily recently for congestion, works when she takes it

## 2011-11-20 NOTE — Patient Instructions (Signed)
Nicotine Addiction Nicotine can act as both a stimulant (excites/activates) and a sedative (calms/quiets). Immediately after exposure to nicotine, there is a "kick" caused in part by the drug's stimulation of the adrenal glands and resulting discharge of adrenaline (epinephrine). The rush of adrenaline stimulates the body and causes a sudden release of sugar. This means that smokers are always slightly hyperglycemic. Hyperglycemic means that the blood sugar is high, just like in diabetics. Nicotine also decreases the amount of insulin which helps control sugar levels in the body. There is an increase in blood pressure, breathing, and the rate of heart beats.  In addition, nicotine indirectly causes a release of dopamine in the brain that controls pleasure and motivation. A similar reaction is seen with other drugs of abuse, such as cocaine and heroin. This dopamine release is thought to cause the pleasurable sensations when smoking. In some different cases, nicotine can also create a calming effect, depending on sensitivity of the smoker's nervous system and the dose of nicotine taken. WHAT HAPPENS WHEN NICOTINE IS TAKEN FOR LONG PERIODS OF TIME?  Long-term use of nicotine results in addiction. It is difficult to stop.   Repeated use of nicotine creates tolerance. Higher doses of nicotine are needed to get the "kick."  When nicotine use is stopped, withdrawal may last a month or more. Withdrawal may begin within a few hours after the last cigarette. Symptoms peak within the first few days and may lessen within a few weeks. For some people, however, symptoms may last for months or longer. Withdrawal symptoms include:   Irritability.   Craving.   Learning and attention deficits.   Sleep disturbances.   Increased appetite.  Craving for tobacco may last for 6 months or longer. Many behaviors done while using nicotine can also play a part in the severity of withdrawal symptoms. For some people, the  feel, smell, and sight of a cigarette and the ritual of obtaining, handling, lighting, and smoking the cigarette are closely linked with the pleasure of smoking. When stopped, they also miss the related behaviors which make the withdrawal or craving worse. While nicotine gum and patches may lessen the drug aspects of withdrawal, cravings often persist. WHAT ARE THE MEDICAL CONSEQUENCES OF NICOTINE USE?  Nicotine addiction accounts for one-third of all cancers. The top cancer caused by tobacco is lung cancer. Lung cancer is the number one cancer killer of both men and women.   Smoking is also associated with cancers of the:   Mouth.   Pharynx.   Larynx.   Esophagus.   Stomach.   Pancreas.   Cervix.   Kidney.   Ureter.   Bladder.   Smoking also causes lung diseases such as lasting (chronic) bronchitis and emphysema.   It worsens asthma in adults and children.   Smoking increases the risk of heart disease, including:   Stroke.   Heart attack.   Vascular disease.   Aneurysm.   Passive or secondary smoke can also increase medical risks including:   Asthma in children.   Sudden Infant Death Syndrome (SIDS).   Additionally, dropped cigarettes are the leading cause of residential fire fatalities.   Nicotine poisoning has been reported from accidental ingestion of tobacco products by children and pets. Death usually results in a few minutes from respiratory failure (when a person stops breathing) caused by paralysis.  TREATMENT   Medication. Nicotine replacement medicines such as nicotine gum and the patch are used to stop smoking. These medicines gradually lower the dosage   of nicotine in the body. These medicines do not contain the carbon monoxide and other toxins found in tobacco smoke.   Hypnotherapy.   Relaxation therapy.   Nicotine Anonymous (a 12-step support program). Find times and locations in your local yellow pages.  Document Released: 03/25/2004 Document  Revised: 07/09/2011 Document Reviewed: 08/17/2007 [facility] Patient Information 2012 [place], [place].   Try MegaRed krill oil caps 1 daily  Try Aleve twice a day

## 2011-11-20 NOTE — Progress Notes (Signed)
Patient ID: Autumn Myers, female   DOB: [birth year], 51 y.o.   MRN: [id number] Autumn Myers [id number] 10-May-1961 11/20/2011      Progress Note-Follow Up  Subjective  Chief Complaint  Chief Complaint  Patient presents with  . Follow-up    1 month    HPI  This is a 51 year old Caucasian female who is in today for followup. Overall she feels much better than she did her first visit. She reports her job, but they've offered her a full-time position. She enjoys her work at [employer] and does feel that occurred over the grief of her failed marriage. She feels she is much more positive outlook and is happy with her progress. She's cut back to about half-pack per day of cigarettes. Allergies have flared recently she is having more congestion. She takes Claritin-D daily and I generally manages it but last week she skipped a day and had a really bad headache with photophobia, phonophobia, nausea. She denies having had migraines before. She took a Claritin-D and Motrin went to bed and when it was gone when she woke up. No other recent illness, fevers, chills, chest pain, palpitations or other complaints noted. She does complain of some aching legs usually after a long work shift. She takes vitamin D occasionally she thinks he gets particularly bad and she doesn't believe that helps somewhat.  Past Medical History  Diagnosis Date  . Allergy   . Anxiety   . Depression   . Chicken pox as a child  . Vitamin d deficiency   . Smoker   . Depression with anxiety   . Preventative health care 10/13/2011  . Allergic state 11/20/2011  . Headache 11/20/2011  . Myalgia 11/20/2011    Past Surgical History  Procedure Date  . Carpal tunnel release     on right and left  . Cesarean section 1994    Family History  Problem Relation Age of Onset  . Hypertension Mother   . Stroke Father   . Leukemia Father   . Hypertension Father   . Cancer Father     leukemia  . Depression Sister   .  Anxiety disorder Sister   . Heart disease Maternal Grandfather     History   Social History  . Marital Status: Legally Separated    Spouse Name: N/A    Number of Children: N/A  . Years of Education: N/A   Occupational History  . Not on file.   Social History Main Topics  . Smoking status: Current Everyday Smoker -- 1.0 packs/day for 15 years    Types: Cigarettes  . Smokeless tobacco: Never Used  . Alcohol Use: No  . Drug Use: No  . Sexually Active: Not Currently   Other Topics Concern  . Not on file   Social History Narrative  . No narrative on file    Current Outpatient Prescriptions on File Prior to Visit  Medication Sig Dispense Refill  . ergocalciferol (VITAMIN D2) 50000 UNITS capsule Take 50,000 Units by mouth once a week.      . folic acid (FOLVITE) 1 MG tablet Take 2 mg by mouth daily.      Marland Kitchen gabapentin (NEURONTIN) 100 MG capsule Take 100 mg by mouth. 1-2 capsules po 3-4 times a day for anxiety      . loratadine (CLARITIN) 10 MG tablet Take 1 tablet (10 mg total) by mouth daily.  30 tablet  5  . minocycline (MINOCIN,DYNACIN) 100 MG capsule  Take 100 mg by mouth daily.      . Vilazodone HCl (VIIBRYD) 40 MG TABS Take 40 mg by mouth daily.        Allergies  Allergen Reactions  . Codeine   . Neosporin (Triple Antibiotic)     Inflamed, itchy   . Penicillins     Review of Systems  Review of Systems  Constitutional: Negative for fever and malaise/fatigue.  HENT: Positive for congestion.   Eyes: Negative for discharge.  Respiratory: Negative for shortness of breath.   Cardiovascular: Negative for chest pain, palpitations and leg swelling.  Gastrointestinal: Negative for nausea, abdominal pain and diarrhea.  Genitourinary: Negative for dysuria.  Musculoskeletal: Positive for myalgias. Negative for falls.  Skin: Negative for rash.  Neurological: Positive for headaches. Negative for loss of consciousness.  Endo/Heme/Allergies: Negative for polydipsia.    Psychiatric/Behavioral: Negative for depression and suicidal ideas. The patient is not nervous/anxious and does not have insomnia.     Objective  BP 104/69  Pulse 83  Temp(Src) 98.5 F (36.9 C) (Temporal)  Ht 5\' 1"  (1.549 m)  Wt 111 lb 1.9 oz (50.404 kg)  BMI 21.00 kg/m2  SpO2 100%  Physical Exam  Physical Exam  Constitutional: She is oriented to person, place, and time and well-developed, well-nourished, and in no distress. No distress.  HENT:  Head: Normocephalic and atraumatic.  Eyes: Conjunctivae are normal.  Neck: Neck supple. No thyromegaly present.  Cardiovascular: Normal rate, regular rhythm and normal heart sounds.   No murmur heard. Pulmonary/Chest: Effort normal and breath sounds normal. She has no wheezes.  Abdominal: She exhibits no distension and no mass.  Musculoskeletal: She exhibits no edema.  Lymphadenopathy:    She has no cervical adenopathy.  Neurological: She is alert and oriented to person, place, and time.  Skin: Skin is warm and dry. No rash noted. She is not diaphoretic.  Psychiatric: Memory, affect and judgment normal.    No results found for this basename: TSH   Lab Results  Component Value Date   HGB 13.6 06/19/2010     Assessment & Plan  Depression with anxiety Doing well on Viibryd and is now working full time and she feels that helps tremendously, no changes, call as needed and return in 6 months  Vitamin d deficiency Patient presently 1 50000IU Vitamin D tab monthly, she tends to do this when her aches and pain are worse and she thinks it helps, will check levels today  Smoker Is now down to 1/2 ppd, is encouraged to attempt complete cessation and is given some tricks for trying to achieve this, she commits to trying  Preventative health care Check fasting labs today  Allergic state Taking Claritin D 1 daily recently for congestion, works when she takes it  Headache Skipped aclaritin one day last weekend and describes a  migraine with photophobia, phonophobia, severe pain. As well as nausea. She took the Claritin-D Motrin, went to sleep and she woke up and it was gone. She is educated regarding migraine prophylaxis, needs good sleep good regular diet. Dehydration. She may try Excedrin Migraine as needed. If this becomes more frequent she needs to let us know.  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

## 2011-11-20 NOTE — Assessment & Plan Note (Signed)
Patient presently 1 50000IU Vitamin D tab monthly, she tends to do this when her aches and pain are worse and she thinks it helps, will check levels today

## 2011-11-23 LAB — VITAMIN D 1,25 DIHYDROXY
Vitamin D2 1, 25 (OH)2: 43 pg/mL
Vitamin D3 1, 25 (OH)2: 10 pg/mL

## 2011-11-27 ENCOUNTER — Other Ambulatory Visit: Payer: Self-pay

## 2011-11-27 MED ORDER — MELOXICAM 15 MG PO TABS: 15.0000 mg | ORAL_TABLET | Freq: Every day | ORAL | Status: DC

## 2011-11-27 MED ORDER — VILAZODONE HCL 40 MG PO TABS: 40.0000 mg | ORAL_TABLET | Freq: Every day | ORAL | Status: DC

## 2011-11-27 NOTE — Telephone Encounter (Signed)
Pt states the Aleve samples that you gave her for her hip and back pain are not helping? Patient would like to know if you could give her any other advise?

## 2011-11-27 NOTE — Telephone Encounter (Signed)
Try Meloxicam 15 mg 1 tab po qd with food prn pain, disp # 30 1 rf

## 2012-01-29 ENCOUNTER — Encounter: Payer: Self-pay | Admitting: Family Medicine

## 2012-01-29 ENCOUNTER — Ambulatory Visit (INDEPENDENT_AMBULATORY_CARE_PROVIDER_SITE_OTHER): Payer: BC Managed Care – PPO | Admitting: Family Medicine

## 2012-01-29 VITALS — BP 125/80 | HR 61 | Temp 96.8°F | Ht 61.0 in | Wt 113.8 lb

## 2012-01-29 DIAGNOSIS — M25551 Pain in right hip: Secondary | ICD-10-CM

## 2012-01-29 DIAGNOSIS — T7840XA Allergy, unspecified, initial encounter: Secondary | ICD-10-CM

## 2012-01-29 DIAGNOSIS — M25559 Pain in unspecified hip: Secondary | ICD-10-CM

## 2012-01-29 DIAGNOSIS — N200 Calculus of kidney: Secondary | ICD-10-CM

## 2012-01-29 DIAGNOSIS — M5410 Radiculopathy, site unspecified: Secondary | ICD-10-CM

## 2012-01-29 DIAGNOSIS — J019 Acute sinusitis, unspecified: Secondary | ICD-10-CM

## 2012-01-29 DIAGNOSIS — R319 Hematuria, unspecified: Secondary | ICD-10-CM

## 2012-01-29 DIAGNOSIS — J329 Chronic sinusitis, unspecified: Secondary | ICD-10-CM

## 2012-01-29 DIAGNOSIS — R350 Frequency of micturition: Secondary | ICD-10-CM

## 2012-01-29 DIAGNOSIS — IMO0002 Reserved for concepts with insufficient information to code with codable children: Secondary | ICD-10-CM

## 2012-01-29 HISTORY — DX: Hematuria, unspecified: R31.9

## 2012-01-29 HISTORY — DX: Pain in right hip: M25.551

## 2012-01-29 HISTORY — DX: Calculus of kidney: N20.0

## 2012-01-29 HISTORY — DX: Acute sinusitis, unspecified: J01.90

## 2012-01-29 LAB — POCT URINALYSIS DIPSTICK
Bilirubin, UA: NEGATIVE
Glucose, UA: NEGATIVE
Leukocytes, UA: NEGATIVE
Protein, UA: NEGATIVE
Spec Grav, UA: <=1.005
Urobilinogen, UA: 0.2
pH, UA: 6

## 2012-01-29 MED ORDER — CIPROFLOXACIN HCL 500 MG PO TABS: 500.0000 mg | ORAL_TABLET | Freq: Two times a day (BID) | ORAL | Status: AC

## 2012-01-29 MED ORDER — GUAIFENESIN ER 600 MG PO TB12: 600.0000 mg | ORAL_TABLET | Freq: Two times a day (BID) | ORAL | Status: DC

## 2012-01-29 MED ORDER — FLUTICASONE PROPIONATE 50 MCG/ACT NA SUSP: 2.0000 | Freq: Every day | NASAL | Status: DC

## 2012-01-29 NOTE — Progress Notes (Signed)
Patient ID: Autumn Myers, female   DOB: [birth year], 51 y.o.   MRN: [id number] Autumn Myers [id number] 1960-11-06 01/29/2012      Progress Note-Follow Up  Subjective  Chief Complaint  Chief Complaint  Patient presents with  . Dizziness    X 2 days    HPI  Is a 51 year old Caucasian female who is in today complaining of a 2 to three-day history of worsening fatigue, chills, congestion, malaise and lightheadedness. She reports stopping Claritin-D about a week and a half ago secondary to a rash on her face. The rash resolved but now taking plain Claritin she is increased congestion and postnasal drip. Over the last 2 days she's been sleeping excessively dealing with fatigue and malaise. Feels lightheaded when she rises quickly. Had one episode of loose stool this morning but has not had recurrent diarrhea. No nausea vomiting or anorexia. No obvious fevers. His nasal congestion but no significant rhinorrhea. Has a mild cough but no shortness of breath, chest pain, palpitations. She also complaining of persistent right hip pain. She's been taking them a lot and we gave her at her last visit and says it helped slightly but she has persistent crepitus and pain in that knee. 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 This is slowly progressing over a long period of time.  Past Medical History  Diagnosis Date  . Allergy   . Anxiety   . Depression   . Chicken pox as a child  . Vitamin d deficiency   . Smoker   . Depression with anxiety   . Preventative health care 10/13/2011  . Allergic state 11/20/2011  . Headache 11/20/2011  . Myalgia 11/20/2011  . Sinusitis acute 01/29/2012  . Hematuria 01/29/2012  . Renal lithiasis 01/29/2012    Past Surgical History  Procedure Date  . Carpal tunnel release     on right and left  . Cesarean section 1994     Family History  Problem Relation Age of Onset  . Hypertension Mother   . Stroke Father   . Leukemia Father   . Hypertension Father   . Cancer Father     leukemia  . Depression Sister   . Anxiety disorder Sister   . Heart disease Maternal Grandfather     History   Social History  . Marital Status: Legally Separated    Spouse Name: N/A    Number of Children: N/A  . Years of Education: N/A   Occupational History  . Not on file.   Social History Main Topics  . Smoking status: Current Everyday Smoker -- 1.0 packs/day for 15 years    Types: Cigarettes  . Smokeless tobacco: Never Used  . Alcohol Use: No  . Drug Use: No  . Sexually Active: Not Currently   Other Topics Concern  . Not on file   Social History Narrative  . No narrative on file    Current Outpatient Prescriptions on File Prior to Visit  Medication Sig Dispense Refill  . ergocalciferol (VITAMIN D2) 50000 UNITS capsule Take 50,000 Units by mouth once a week.      . folic acid (FOLVITE) 1 MG tablet Take 2 mg by mouth daily.      Marland Kitchen gabapentin (NEURONTIN) 100 MG capsule Take 100 mg by mouth. 1-2 capsules po 3-4 times a day for anxiety      .  loratadine (CLARITIN) 10 MG tablet Take 1 tablet (10 mg total) by mouth daily.  30 tablet  5  . meloxicam (MOBIC) 15 MG tablet Take 1 tablet (15 mg total) by mouth daily. With food  30 tablet  1  . minocycline (MINOCIN,DYNACIN) 100 MG capsule Take 100 mg by mouth daily.      . Vilazodone HCl (VIIBRYD) 40 MG TABS Take 1 tablet (40 mg total) by mouth daily.  30 tablet  2  . fluticasone (FLONASE) 50 MCG/ACT nasal spray Place 2 sprays into the nose daily.  16 g  6    Allergies  Allergen Reactions  . Codeine   . Neosporin (Neomycin-Bacitracin Zn-Polymyx)     Inflamed, itchy   . Penicillins     Review of Systems  Review of Systems  Constitutional: Positive for chills and malaise/fatigue. Negative for fever.  HENT: Positive for congestion.        PND with throat  irritation  Eyes: Negative for discharge.  Respiratory: Positive for sputum production. Negative for shortness of breath.   Cardiovascular: Negative for chest pain, palpitations and leg swelling.  Gastrointestinal: Negative for nausea, abdominal pain and diarrhea.  Genitourinary: Negative for dysuria.  Musculoskeletal: Negative for falls.  Skin: Negative for rash.  Neurological: Negative for loss of consciousness and headaches.  Endo/Heme/Allergies: Negative for polydipsia.  Psychiatric/Behavioral: Negative for depression and suicidal ideas. The patient is not nervous/anxious and does not have insomnia.     Objective  BP 125/80  Pulse 61  Temp 96.8 F (36 C) (Temporal)  Ht 5\' 1"  (1.549 m)  Wt 113 lb 12.8 oz (51.619 kg)  BMI 21.50 kg/m2  SpO2 100%  Physical Exam  Physical Exam  Constitutional: She is oriented to person, place, and time and well-developed, well-nourished, and in no distress. No distress.  HENT:  Head: Normocephalic and atraumatic.       Nasal mucosa boggy and erythematous, TM dull with air bubbles behind on right, clear on left.  Eyes: Conjunctivae are normal.  Neck: Neck supple. No thyromegaly present.  Cardiovascular: Normal rate, regular rhythm and normal heart sounds.   No murmur heard. Pulmonary/Chest: Effort normal and breath sounds normal. She has no wheezes.  Abdominal: She exhibits no distension and no mass.  Musculoskeletal: She exhibits no edema.  Lymphadenopathy:    She has no cervical adenopathy.  Neurological: She is alert and oriented to person, place, and time.  Skin: Skin is warm and dry. No rash noted. She is not diaphoretic.  Psychiatric: Memory, affect and judgment normal.    Lab Results  Component Value Date   TSH 2.18 11/20/2011   Lab Results  Component Value Date   WBC 7.8 11/20/2011   HGB 13.2 11/20/2011   HCT 39.8 11/20/2011   MCV 93.8 11/20/2011   PLT 250.0 11/20/2011   Lab Results  Component Value Date   CREATININE 0.9  11/20/2011   BUN 9 11/20/2011   NA 141 11/20/2011   K 3.7 11/20/2011   CL 104 11/20/2011   CO2 27 11/20/2011   Lab Results  Component Value Date   ALT 14 11/20/2011   AST 23 11/20/2011   ALKPHOS 77 11/20/2011   BILITOT 0.4 11/20/2011   Lab Results  Component Value Date   CHOL 158 11/20/2011   Lab Results  Component Value Date   HDL 55.50 11/20/2011   Lab Results  Component Value Date   LDLCALC 92 11/20/2011   Lab Results  Component Value Date  TRIG 55.0 11/20/2011   Lab Results  Component Value Date   CHOLHDL 3 11/20/2011     Assessment & Plan  Hematuria Trace, will send for culture and reevaluate at next visit. Increase hydration  Sinusitis acute Ciprofloxacin and Mucinex bid, increase hydration. Increase rest  Renal lithiasis Patient reports distant history requiring lithotripsy in the past. Encouraged increased hydration and report any concerning symptoms  Allergic state Was taking claritin d and she stopped it due to a facial rash, she is now only taking plain Claritin and her her rash is better but her allergies are worse. Add qhs Benadryl and Flonase reasess at next visit.  Hip pain, right Continue right knee and refer to physiatry for further evaluation and PT as indicated, continue Meloxicam and try topical pain patches while driving

## 2012-01-29 NOTE — Addendum Note (Signed)
Addended by: Baldemar Lenis R on: 01/29/2012 01:56 PM   Modules accepted: Orders

## 2012-01-29 NOTE — Assessment & Plan Note (Signed)
Was taking claritin d and she stopped it due to a facial rash, she is now only taking plain Claritin and her her rash is better but her allergies are worse. Add qhs Benadryl and Flonase reasess at next visit.

## 2012-01-29 NOTE — Patient Instructions (Addendum)

## 2012-01-29 NOTE — Assessment & Plan Note (Signed)
Patient reports distant history requiring lithotripsy in the past. Encouraged increased hydration and report any concerning symptoms

## 2012-01-29 NOTE — Assessment & Plan Note (Signed)
Trace, will send for culture and reevaluate at next visit. Increase hydration

## 2012-01-29 NOTE — Assessment & Plan Note (Signed)
Continue right knee and refer to physiatry for further evaluation and PT as indicated, continue Meloxicam and try topical pain patches while driving

## 2012-01-29 NOTE — Assessment & Plan Note (Signed)
Ciprofloxacin and Mucinex bid, increase hydration. Increase rest

## 2012-02-03 ENCOUNTER — Encounter: Payer: Self-pay | Admitting: Family Medicine

## 2012-02-03 ENCOUNTER — Ambulatory Visit (INDEPENDENT_AMBULATORY_CARE_PROVIDER_SITE_OTHER): Payer: BC Managed Care – PPO | Admitting: Family Medicine

## 2012-02-03 VITALS — BP 119/74 | HR 76 | Temp 97.0°F | Ht 61.0 in | Wt 111.0 lb

## 2012-02-03 DIAGNOSIS — S71009A Unspecified open wound, unspecified hip, initial encounter: Secondary | ICD-10-CM

## 2012-02-03 DIAGNOSIS — F419 Anxiety disorder, unspecified: Secondary | ICD-10-CM

## 2012-02-03 DIAGNOSIS — S71131A Puncture wound without foreign body, right thigh, initial encounter: Secondary | ICD-10-CM

## 2012-02-03 DIAGNOSIS — F341 Dysthymic disorder: Secondary | ICD-10-CM

## 2012-02-03 DIAGNOSIS — J019 Acute sinusitis, unspecified: Secondary | ICD-10-CM

## 2012-02-03 DIAGNOSIS — F418 Other specified anxiety disorders: Secondary | ICD-10-CM

## 2012-02-03 DIAGNOSIS — R52 Pain, unspecified: Secondary | ICD-10-CM

## 2012-02-03 HISTORY — DX: Puncture wound without foreign body, right thigh, initial encounter: S71.131A

## 2012-02-03 MED ORDER — GABAPENTIN 300 MG PO CAPS: 300.0000 mg | ORAL_CAPSULE | Freq: Two times a day (BID) | ORAL | Status: DC

## 2012-02-03 NOTE — Progress Notes (Signed)
Patient ID: Autumn Myers, female   DOB: [birth year], 51 y.o.   MRN: [id number] CHRISTABEL CAMIRE [id number] 1961/02/14 02/03/2012      Progress Note-Follow Up  Subjective  Chief Complaint  Chief Complaint  Patient presents with  . Advice Only    wants to talk to MD    HPI  This 51 year old Caucasian female who is in today for worsening depression. Has long history of depression anxiety and has been working with a psychiatrist for the last couple of years secondary to dissolution of her 49 year marriage. She is working full-time as a Advertising copywriter in her physical health continues to decline with many aches and pains including back pain hand pain and myalgias. She was in here last week with the sinusitis and started on ciprofloxacin. She reports that is improving but unfortunately this past Monday her fracture showed up. He dropped the keys to her storage facility and told her that he bought a house. She says that since her into a downward spiral secondary to her dream always according to home. She says she's been crying for days has been unable to work. She's been unable to think clearly. She reports that it. On Monday she does not remember well. She technologist she stepped off in her right leg a couple times and remembers doing it secondary to her steroid. She's never done this before says reports she does not feel inclined to do it again. Her neighbor came over and cleaned the wound and she's had no fevers, chills, malaise, discharge or pain. Her psychiatrist is left town and she tried to call the office but no one has called her back so she is here today to discuss her symptoms. She increased her gabapentin to 300 mg and does feel that helps her anxiety somewhat  Past Medical History  Diagnosis Date  . Allergy   . Anxiety   . Depression   . Chicken pox as a child  . Vitamin d deficiency   . Smoker   . Depression with anxiety   . Preventative health care 10/13/2011  . Allergic state 11/20/2011    . Headache 11/20/2011  . Myalgia 11/20/2011  . Sinusitis acute 01/29/2012  . Hematuria 01/29/2012  . Renal lithiasis 01/29/2012  . Hip pain, right 01/29/2012  . Puncture wound of thigh, right 02/03/2012    Past Surgical History  Procedure Date  . Carpal tunnel release     on right and left  . Cesarean section 1994    Family History  Problem Relation Age of Onset  . Hypertension Mother   . Stroke Father   . Leukemia Father   . Hypertension Father   . Cancer Father     leukemia  . Depression Sister   . Anxiety disorder Sister   . Heart disease Maternal Grandfather     History   Social History  . Marital Status: Legally Separated    Spouse Name: N/A    Number of Children: N/A  . Years of Education: N/A   Occupational History  . Not on file.   Social History Main Topics  . Smoking status: Current Everyday Smoker -- 1.0 packs/day for 15 years    Types: Cigarettes  . Smokeless tobacco: Never Used  . Alcohol Use: No  . Drug Use: No  . Sexually Active: Not Currently   Other Topics Concern  . Not on file   Social History Narrative  . No narrative on file    Current Outpatient Prescriptions  on File Prior to Visit  Medication Sig Dispense Refill  . ciprofloxacin (CIPRO) 500 MG tablet Take 1 tablet (500 mg total) by mouth 2 (two) times daily.  20 tablet  0  . ergocalciferol (VITAMIN D2) 50000 UNITS capsule Take 50,000 Units by mouth once a week.      . fluticasone (FLONASE) 50 MCG/ACT nasal spray Place 2 sprays into the nose daily.  16 g  6  . folic acid (FOLVITE) 1 MG tablet Take 2 mg by mouth daily.      Marland Kitchen guaiFENesin (MUCINEX) 600 MG 12 hr tablet Take 1 tablet (600 mg total) by mouth 2 (two) times daily. X 10 days  20 tablet  0  . loratadine (CLARITIN) 10 MG tablet Take 1 tablet (10 mg total) by mouth daily.  30 tablet  5  . meloxicam (MOBIC) 15 MG tablet Take 1 tablet (15 mg total) by mouth daily. With food  30 tablet  1  . Vilazodone HCl (VIIBRYD) 40 MG TABS Take 1  tablet (40 mg total) by mouth daily.  30 tablet  2  . DISCONTD: gabapentin (NEURONTIN) 100 MG capsule Take 100 mg by mouth. 1-2 capsules po 3-4 times a day for anxiety      . minocycline (MINOCIN,DYNACIN) 100 MG capsule Take 100 mg by mouth daily.        Allergies  Allergen Reactions  . Codeine   . Neosporin (Neomycin-Bacitracin Zn-Polymyx)     Inflamed, itchy   . Penicillins     Review of Systems  Review of Systems  Constitutional: Negative for fever and malaise/fatigue.  HENT: Negative for congestion.   Eyes: Negative for discharge.  Respiratory: Negative for shortness of breath.   Cardiovascular: Negative for chest pain, palpitations and leg swelling.  Gastrointestinal: Negative for nausea, abdominal pain and diarrhea.  Genitourinary: Negative for dysuria.  Musculoskeletal: Positive for myalgias, back pain and joint pain. Negative for falls.       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 C/o hand pain, back pain, myalgias etc today  Skin: Negative for rash.  Neurological: Negative for loss of consciousness and headaches.  Endo/Heme/Allergies: Negative for polydipsia.  Psychiatric/Behavioral: Positive for depression. Negative for suicidal ideas. The patient is nervous/anxious. The patient does not have insomnia.     Objective  BP 119/74  Pulse 76  Temp 97 F (36.1 C) (Temporal)  Ht 5\' 1"  (1.549 m)  Wt 111 lb (50.349 kg)  BMI 20.97 kg/m2  SpO2 100%  Physical Exam  Physical Exam  Constitutional: She is oriented to person, place, and time and well-developed, well-nourished, and in no distress. No distress.  HENT:  Head: Normocephalic and atraumatic.  Eyes: Conjunctivae are normal.  Neck: Neck supple. No thyromegaly present.  Cardiovascular: Normal rate, regular rhythm and normal heart sounds.   No murmur heard. Pulmonary/Chest: Effort normal and breath sounds normal. She has no wheezes.  Abdominal: She exhibits no distension and no mass.    Musculoskeletal: She exhibits no edema.       Right leg, 2 scabs < 1 cm in diameter healing well, without fluctuance. 1cm scabbed lesion covered with steristrips and dried blood. No surrounding fluctuance erythema or discharge  Lymphadenopathy:    She has no cervical adenopathy.  Neurological: She is alert and oriented to person, place, and time.  Skin: Skin is warm and dry. No rash noted. She is not diaphoretic.  Psychiatric: Memory and judgment normal.       Labile,  cries frequently    Lab Results  Component Value Date   TSH 2.18 11/20/2011   Lab Results  Component Value Date   WBC 7.8 11/20/2011   HGB 13.2 11/20/2011   HCT 39.8 11/20/2011   MCV 93.8 11/20/2011   PLT 250.0 11/20/2011   Lab Results  Component Value Date   CREATININE 0.9 11/20/2011   BUN 9 11/20/2011   NA 141 11/20/2011   K 3.7 11/20/2011   CL 104 11/20/2011   CO2 27 11/20/2011   Lab Results  Component Value Date   ALT 14 11/20/2011   AST 23 11/20/2011   ALKPHOS 77 11/20/2011   BILITOT 0.4 11/20/2011   Lab Results  Component Value Date   CHOL 158 11/20/2011   Lab Results  Component Value Date   HDL 55.50 11/20/2011   Lab Results  Component Value Date   LDLCALC 92 11/20/2011   Lab Results  Component Value Date   TRIG 55.0 11/20/2011   Lab Results  Component Value Date   CHOLHDL 3 11/20/2011     Assessment & Plan  Depression with anxiety Patient in today struggling more with her depression again. She had a bad set back this week when her ex showed up with her storage key and told her he had just bought a house. She says she has been crying for days and unable to work. He felt she was doing well. Unfortunately her psychiatrist has left the area and she tried to call the office and got no response. She is in today because during that day she says she was so depressed that she actually did some self-harm behaviors were she hurt her right leg. She's not hurt herself since and she says she never has before and  promises never again. Her neighbor came over and cleaned the wound and put some Steri-Strips on it. She's had no fevers or concerns since. She agrees to seek care if she gets bad again. Acknowledges she's doing better and is not at risk to harm herself. She is referred to our behavioral health for further counseling and her gabapentin is increased from 300 mg once a day to twice a day if that does seem to help her anxiety. We'll continue Viibryd at 40 mg.  Sinusitis acute Improving slowly on ciprofloxacin but has been out of work since last week. Given work note.  Puncture wound of thigh, right 2 small superficial wounds next to a 3rd wound covered with steristrips, none are swollen, inflamed or discharging. She is encouraged to clean it daily with warm soap and water and allow the steristrips to fall off on their own. Call for antibiotics if any fevers, dishcarge or pain develops

## 2012-02-03 NOTE — Assessment & Plan Note (Signed)
2 small superficial wounds next to a 3rd wound covered with steristrips, none are swollen, inflamed or discharging. She is encouraged to clean it daily with warm soap and water and allow the steristrips to fall off on their own. Call for antibiotics if any fevers, dishcarge or pain develops

## 2012-02-03 NOTE — Patient Instructions (Addendum)

## 2012-02-03 NOTE — Assessment & Plan Note (Addendum)
Improving slowly on ciprofloxacin but has been out of work since last week. Given work note.

## 2012-02-03 NOTE — Assessment & Plan Note (Signed)
Patient in today struggling more with her depression again. She had a bad set back this week when her ex showed up with her storage key and told her he had just bought a house. She says she has been crying for days and unable to work. He felt she was doing well. Unfortunately her psychiatrist has left the area and she tried to call the office and got no response. She is in today because during that day she says she was so depressed that she actually did some self-harm behaviors were she hurt her right leg. She's not hurt herself since and she says she never has before and promises never again. Her neighbor came over and cleaned the wound and put some Steri-Strips on it. She's had no fevers or concerns since. She agrees to seek care if she gets bad again. Acknowledges she's doing better and is not at risk to harm herself. She is referred to our behavioral health for further counseling and her gabapentin is increased from 300 mg once a day to twice a day if that does seem to help her anxiety. We'll continue Viibryd at 40 mg.

## 2012-02-18 ENCOUNTER — Encounter: Payer: Self-pay | Admitting: Family Medicine

## 2012-02-18 ENCOUNTER — Ambulatory Visit (INDEPENDENT_AMBULATORY_CARE_PROVIDER_SITE_OTHER): Payer: BC Managed Care – PPO | Admitting: Family Medicine

## 2012-02-18 VITALS — BP 111/72 | HR 79 | Temp 96.9°F | Ht 61.0 in | Wt 112.0 lb

## 2012-02-18 DIAGNOSIS — R51 Headache: Secondary | ICD-10-CM

## 2012-02-18 DIAGNOSIS — J069 Acute upper respiratory infection, unspecified: Secondary | ICD-10-CM

## 2012-02-18 MED ORDER — BUDESONIDE 32 MCG/ACT NA SUSP: NASAL | Status: DC

## 2012-02-18 MED ORDER — FOLIC ACID 1 MG PO TABS: 2.0000 mg | ORAL_TABLET | Freq: Every day | ORAL | Status: DC

## 2012-02-18 MED ORDER — CETIRIZINE HCL 10 MG PO TABS: 10.0000 mg | ORAL_TABLET | Freq: Every day | ORAL | Status: DC

## 2012-02-18 NOTE — Progress Notes (Signed)
OFFICE VISIT  02/20/2012   CC:  Chief Complaint  Patient presents with  . Sinus Problem    cough, congestion, ear ache     HPI:    Patient is a 51 y.o. Caucasian female who presents for "sinus" symptoms. She has been treated relatively recently with cipro for acute sinusitis sx's. Pt presents complaining of respiratory symptoms for 3-4 days.  Primary symptoms are: facial pressure, ear pressure, some mucous from nose today, coughing some up as well  Cough is not prominent.  Worst symptoms seems to be the headache and pressure in upper facial area and forehead.  No nausea.  Lately the symptoms seemed to be worsening but feels a bit better today.  No temp checked but had chills once. Pertinent negatives: No fevers, no signif wheezing, and no SOB.  ST mild at most.   Symptoms made worse by nothing.  Symptoms improved by nothing.  Says flonase has been making her nose burn lately so she has d/c'd this med.  Sounds like she takes zyrtec only occasionally. Smoker? yes Recent sick contact? 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 Yes, she says this is chronic. Stress level has been very high, currently fighting through/being treated for major depression. Additional ROS: no n/v/d or abdominal pain.  No rash.  No neck stiffness.   +Mild fatigue.  +Mild appetite loss.   Past Medical History  Diagnosis Date  . Allergy   . Anxiety   . Depression   . Chicken pox as a child  . Vitamin d deficiency   . Smoker   . Depression with anxiety   . Preventative health care 10/13/2011  . Allergic state 11/20/2011  . Headache 11/20/2011  . Myalgia 11/20/2011  . Sinusitis acute 01/29/2012  . Hematuria 01/29/2012  . Renal lithiasis 01/29/2012  . Hip pain, right 01/29/2012  . Puncture wound of thigh, right 02/03/2012    Past Surgical History  Procedure Date  . Carpal tunnel release     on right and left  . Cesarean section 1994    Outpatient Prescriptions Prior to Visit    Medication Sig Dispense Refill  . ergocalciferol (VITAMIN D2) 50000 UNITS capsule Take 50,000 Units by mouth once a week.      . gabapentin (NEURONTIN) 300 MG capsule Take 1 capsule (300 mg total) by mouth 2 (two) times daily.  60 capsule  2  . guaiFENesin (MUCINEX) 600 MG 12 hr tablet Take 1 tablet (600 mg total) by mouth 2 (two) times daily. X 10 days  20 tablet  0  . loratadine (CLARITIN) 10 MG tablet Take 1 tablet (10 mg total) by mouth daily.  30 tablet  5  . meloxicam (MOBIC) 15 MG tablet Take 1 tablet (15 mg total) by mouth daily. With food  30 tablet  1  . minocycline (MINOCIN,DYNACIN) 100 MG capsule Take 100 mg by mouth daily.      . Vilazodone HCl (VIIBRYD) 40 MG TABS Take 1 tablet (40 mg total) by mouth daily.  30 tablet  2  . fluticasone (FLONASE) 50 MCG/ACT nasal spray Place 2 sprays into the nose daily.  16 g  6  . folic acid (FOLVITE) 1 MG tablet Take 2 mg by mouth daily.        Allergies  Allergen Reactions  . Neosporin (Neomycin-Bacitracin Zn-Polymyx)     Inflamed, itchy   . Penicillins   . Codeine     GI upset    ROS As per  HPI  PE: Blood pressure 111/72, pulse 79, temperature 96.9 F (36.1 C), temperature source Temporal, height 5\' 1"  (1.549 m), weight 112 lb (50.803 kg). VS: noted--normal. Gen: alert, NAD, NONTOXIC APPEARING. HEENT: eyes without injection, drainage, or swelling.  Ears: EACs clear, TMs with normal light reflex and landmarks.  Nose: Clear rhinorrhea, with some dried, crusty exudate adherent to mildly injected mucosa.  No purulent d/c.  No paranasal sinus TTP.  No facial swelling.  Throat and mouth without focal lesion.  No pharyngial swelling, erythema, or exudate.   Neck: supple, no LAD.   LUNGS: CTA bilat, nonlabored resps.   CV: RRR, no m/r/g. EXT: no c/c/e SKIN: no rash  LABS:  None today  IMPRESSION AND PLAN:  Recurrent URI (upper respiratory infection) Suspect viral uri/rhinitis. Allergic rhinitis less likely.  I have low suspicion  of acute bacterial sinusitis. With recurrence of sx's lately and the inherent ambiguity involved in clinical dx of sinusitis, will check plain film of sinuses and I do not think antibiotic is indicated at this time.   Will change flonase to rhinocort to see if burning effect assoc with flonase goes away.  Recommended saline nasal spray bid. Change claritin to zyrtec 10mg  qd.    FOLLOW UP: Return if symptoms worsen or fail to improve.

## 2012-02-19 ENCOUNTER — Ambulatory Visit (HOSPITAL_BASED_OUTPATIENT_CLINIC_OR_DEPARTMENT_OTHER)
Admission: RE | Admit: 2012-02-19 | Discharge: 2012-02-19 | Disposition: A | Discharge status: Home / Self Care | Payer: BC Managed Care – PPO | Source: Ambulatory Visit | Attending: Family Medicine | Admitting: Family Medicine

## 2012-02-19 DIAGNOSIS — J3489 Other specified disorders of nose and nasal sinuses: Secondary | ICD-10-CM | POA: Insufficient documentation

## 2012-02-19 DIAGNOSIS — R519 Headache, unspecified: Secondary | ICD-10-CM

## 2012-02-19 DIAGNOSIS — R51 Headache: Secondary | ICD-10-CM

## 2012-02-19 DIAGNOSIS — J069 Acute upper respiratory infection, unspecified: Secondary | ICD-10-CM

## 2012-02-20 DIAGNOSIS — J069 Acute upper respiratory infection, unspecified: Secondary | ICD-10-CM | POA: Insufficient documentation

## 2012-02-20 NOTE — Assessment & Plan Note (Signed)
Suspect viral uri/rhinitis. Allergic rhinitis less likely.  I have low suspicion of acute bacterial sinusitis. With recurrence of sx's lately and the inherent ambiguity involved in clinical dx of sinusitis, will check plain film of sinuses and I do not think antibiotic is indicated at this time.   Will change flonase to rhinocort to see if burning effect assoc with flonase goes away.  Recommended saline nasal spray bid. Change claritin to zyrtec 10mg  qd.

## 2012-02-26 ENCOUNTER — Ambulatory Visit: Payer: BC Managed Care – PPO | Admitting: Family Medicine

## 2012-03-16 ENCOUNTER — Other Ambulatory Visit: Payer: Self-pay

## 2012-03-16 MED ORDER — VILAZODONE HCL 40 MG PO TABS: 40.0000 mg | ORAL_TABLET | Freq: Every day | ORAL | Status: DC

## 2012-03-24 ENCOUNTER — Ambulatory Visit (INDEPENDENT_AMBULATORY_CARE_PROVIDER_SITE_OTHER): Payer: BC Managed Care – PPO | Admitting: Family Medicine

## 2012-03-24 ENCOUNTER — Encounter: Payer: Self-pay | Admitting: Family Medicine

## 2012-03-24 VITALS — BP 105/69 | HR 71 | Temp 97.9°F | Ht 61.0 in | Wt 115.8 lb

## 2012-03-24 DIAGNOSIS — F341 Dysthymic disorder: Secondary | ICD-10-CM

## 2012-03-24 DIAGNOSIS — M25559 Pain in unspecified hip: Secondary | ICD-10-CM

## 2012-03-24 DIAGNOSIS — M25551 Pain in right hip: Secondary | ICD-10-CM

## 2012-03-24 DIAGNOSIS — M545 Low back pain: Secondary | ICD-10-CM

## 2012-03-24 DIAGNOSIS — IMO0002 Reserved for concepts with insufficient information to code with codable children: Secondary | ICD-10-CM

## 2012-03-24 DIAGNOSIS — M5410 Radiculopathy, site unspecified: Secondary | ICD-10-CM

## 2012-03-24 DIAGNOSIS — F418 Other specified anxiety disorders: Secondary | ICD-10-CM

## 2012-03-24 MED ORDER — CYCLOBENZAPRINE HCL 10 MG PO TABS: 10.0000 mg | ORAL_TABLET | Freq: Two times a day (BID) | ORAL | Status: DC | PRN

## 2012-03-24 NOTE — Patient Instructions (Addendum)
Back Pain, Adult Low back pain is very common. About 1 in 5 people have back pain.The cause of low back pain is rarely dangerous. The pain often gets better over time.About half of people with a sudden onset of back pain feel better in just 2 weeks. About 8 in 10 people feel better by 6 weeks.  CAUSES Some common causes of back pain include:  Strain of the muscles or ligaments supporting the spine.   Wear and tear (degeneration) of the spinal discs.   Arthritis.   Direct injury to the back.  DIAGNOSIS Most of the time, the direct cause of low back pain is not known.However, back pain can be treated effectively even when the exact cause of the pain is unknown.Answering your caregiver's questions about your overall health and symptoms is one of the most accurate ways to make sure the cause of your pain is not dangerous. If your caregiver needs more information, he or she may order lab work or imaging tests (X-rays or MRIs).However, even if imaging tests show changes in your back, this usually does not require surgery. HOME CARE INSTRUCTIONS For many people, back pain returns.Since low back pain is rarely dangerous, it is often a condition that people can learn to manageon their own.   Remain active. It is stressful on the back to sit or stand in one place. Do not sit, drive, or stand in one place for more than 30 minutes at a time. Take short walks on level surfaces as soon as pain allows.Try to increase the length of time you walk each day.   Do not stay in bed.Resting more than 1 or 2 days can delay your recovery.   Do not avoid exercise or work.Your body is made to move.It is not dangerous to be active, even though your back may hurt.Your back will likely heal faster if you return to being active before your pain is gone.   Pay attention to your body when you bend and lift. Many people have less discomfortwhen lifting if they bend their knees, keep the load close to their  bodies,and avoid twisting. Often, the most comfortable positions are those that put less stress on your recovering back.   Find a comfortable position to sleep. Use a firm mattress and lie on your side with your knees slightly bent. If you lie on your back, put a pillow under your knees.   Only take over-the-counter or prescription medicines as directed by your caregiver. Over-the-counter medicines to reduce pain and inflammation are often the most helpful.Your caregiver may prescribe muscle relaxant drugs.These medicines help dull your pain so you can more quickly return to your normal activities and healthy exercise.   Put ice on the injured area.   Put ice in a plastic bag.   Place a towel between your skin and the bag.   Leave the ice on for 15 to 20 minutes, 3 to 4 times a day for the first 2 to 3 days. After that, ice and heat may be alternated to reduce pain and spasms.   Ask your caregiver about trying back exercises and gentle massage. This may be of some benefit.   Avoid feeling anxious or stressed.Stress increases muscle tension and can worsen back pain.It is important to recognize when you are anxious or stressed and learn ways to manage it.Exercise is a great option.  SEEK MEDICAL CARE IF:  You have pain that is not relieved with rest or medicine.   You have   pain that does not improve in 1 week.   You have new symptoms.   You are generally not feeling well.  SEEK IMMEDIATE MEDICAL CARE IF:   You have pain that radiates from your back into your legs.   You develop new bowel or bladder control problems.   You have unusual weakness or numbness in your arms or legs.   You develop nausea or vomiting.   You develop abdominal pain.   You feel faint.  Document Released: 07/20/2005 Document Revised: 07/09/2011 Document Reviewed: 12/08/2010 [employer] Patient Information 2012 [employer] 

## 2012-03-25 ENCOUNTER — Ambulatory Visit: Payer: BC Managed Care – PPO | Admitting: Family Medicine

## 2012-03-27 NOTE — Assessment & Plan Note (Addendum)
Pain debilitating, is ready for referral to orthopaedics

## 2012-03-27 NOTE — Progress Notes (Signed)
Patient ID: Autumn Myers, female   DOB: March 22, 1961, 51 y.o.   MRN: [id number] Autumn Myers [id number] Dec 08, 1960 03/27/2012      Progress Note-Follow Up  Subjective  Chief Complaint  Chief Complaint  Patient presents with  . Follow-up    hip popping- did physical therapy- didn't help    HPI   patient is a 51 year old Caucasian female who continues to struggle with significant right hip pain. She tried some physical therapy and was. Her pain is constant but worse with use. She is constant clicking and popping in the hip. Has trouble sleeping at night secondary to the discomfort. No recent falls, injury, fevers, chest pain, palpitations, shortness of breath, GI or GU complaints otherwise.  Past Medical History  Diagnosis Date  . Allergy   . Anxiety   . Depression   . Chicken pox as a child  . Vitamin d deficiency   . Smoker   . Depression with anxiety   . Preventative health care 10/13/2011  . Allergic state 11/20/2011  . Headache 11/20/2011  . Myalgia 11/20/2011  . Sinusitis acute 01/29/2012  . Hematuria 01/29/2012  . Renal lithiasis 01/29/2012  . Hip pain, right 01/29/2012  . Puncture wound of thigh, right 02/03/2012    Past Surgical History  Procedure Date  . Carpal tunnel release     on right and left  . Cesarean section 1994    Family History  Problem Relation Age of Onset  . Hypertension Mother   . Stroke Father   . Leukemia Father   . Hypertension Father   . Cancer Father     leukemia  . Depression Sister   . Anxiety disorder Sister   . Heart disease Maternal Grandfather     History   Social History  . Marital Status: Legally Separated    Spouse Name: N/A    Number of Children: N/A  . Years of Education: N/A   Occupational History  . Not on file.   Social History Main Topics  . Smoking status: Current Everyday Smoker -- 1.0 packs/day for 15 years    Types: Cigarettes  . Smokeless tobacco: Never Used  . Alcohol Use: No  . Drug Use: No  . Sexually  Active: Not Currently   Other Topics Concern  . Not on file   Social History Narrative  . No narrative on file    Current Outpatient Prescriptions on File Prior to Visit  Medication Sig Dispense Refill  . ergocalciferol (VITAMIN D2) 50000 UNITS capsule Take 50,000 Units by mouth once a week.      . folic acid (FOLVITE) 1 MG tablet Take 2 tablets (2 mg total) by mouth daily.  60 tablet  6  . gabapentin (NEURONTIN) 300 MG capsule Take 1 capsule (300 mg total) by mouth 2 (two) times daily.  60 capsule  2  . loratadine (CLARITIN) 10 MG tablet Take 1 tablet (10 mg total) by mouth daily.  30 tablet  5  . minocycline (MINOCIN,DYNACIN) 100 MG capsule Take 100 mg by mouth daily.      . Vilazodone HCl (VIIBRYD) 40 MG TABS Take 1 tablet (40 mg total) by mouth daily.  30 tablet  2  . budesonide (RHINOCORT AQUA) 32 MCG/ACT nasal spray 2 sprays each nostril once every morning  1 Bottle  3    Allergies  Allergen Reactions  . Neosporin (Neomycin-Bacitracin Zn-Polymyx)     Inflamed, itchy   . Penicillins   . Codeine  GI upset    Review of Systems  Review of Systems  Constitutional: Negative for fever and malaise/fatigue.  HENT: Negative for congestion.   Eyes: Negative for discharge.  Respiratory: Negative for shortness of breath.   Cardiovascular: Negative for chest pain, palpitations and leg swelling.  Gastrointestinal: Negative for nausea, abdominal pain and diarrhea.  Genitourinary: Negative for dysuria.  Musculoskeletal: Positive for joint pain. Negative for falls.       Right hip pain persistent  Skin: Negative for rash.  Neurological: Negative for loss of consciousness and headaches.  Endo/Heme/Allergies: Negative for polydipsia.  Psychiatric/Behavioral: Negative for depression and suicidal ideas. The patient is not nervous/anxious and does not have insomnia.     Objective  BP 105/69  Pulse 71  Temp 97.9 F (36.6 C) (Temporal)  Ht 5\' 1"  (1.549 m)  Wt 115 lb 12.8 oz  (52.527 kg)  BMI 21.88 kg/m2  SpO2 99%  LMP 11/16/2008  Physical Exam  Physical Exam  Constitutional: She is oriented to person, place, and time and well-developed, well-nourished, and in no distress. No distress.  HENT:  Head: Normocephalic and atraumatic.  Eyes: Conjunctivae are normal.  Neck: Neck supple. No thyromegaly present.  Cardiovascular: Normal rate, regular rhythm and normal heart sounds.   No murmur heard. Pulmonary/Chest: Effort normal and breath sounds normal. She has no wheezes.  Abdominal: She exhibits no distension and no mass.  Musculoskeletal: She exhibits no edema.  Lymphadenopathy:    She has no cervical adenopathy.  Neurological: She is alert and oriented to person, place, and time.  Skin: Skin is warm and dry. No rash noted. She is not diaphoretic.  Psychiatric: Memory, affect and judgment normal.    Lab Results  Component Value Date   TSH 2.18 11/20/2011   Lab Results  Component Value Date   WBC 7.8 11/20/2011   HGB 13.2 11/20/2011   HCT 39.8 11/20/2011   MCV 93.8 11/20/2011   PLT 250.0 11/20/2011   Lab Results  Component Value Date   CREATININE 0.9 11/20/2011   BUN 9 11/20/2011   NA 141 11/20/2011   K 3.7 11/20/2011   CL 104 11/20/2011   CO2 27 11/20/2011   Lab Results  Component Value Date   ALT 14 11/20/2011   AST 23 11/20/2011   ALKPHOS 77 11/20/2011   BILITOT 0.4 11/20/2011   Lab Results  Component Value Date   CHOL 158 11/20/2011   Lab Results  Component Value Date   HDL 55.50 11/20/2011   Lab Results  Component Value Date   LDLCALC 92 11/20/2011   Lab Results  Component Value Date   TRIG 55.0 11/20/2011   Lab Results  Component Value Date   CHOLHDL 3 11/20/2011     Assessment & Plan  Hip pain, right Pain debilitating, is ready for referral to orthopaedics   Depression with anxiety Notes only partial response to Viibryd.may need further medicaitons in the future

## 2012-03-27 NOTE — Assessment & Plan Note (Signed)
Notes only partial response to Viibryd.may need further medicaitons in the future

## 2012-04-25 ENCOUNTER — Other Ambulatory Visit: Payer: Self-pay | Admitting: Orthopedic Surgery

## 2012-04-25 ENCOUNTER — Ambulatory Visit (INDEPENDENT_AMBULATORY_CARE_PROVIDER_SITE_OTHER): Payer: BC Managed Care – PPO

## 2012-04-25 DIAGNOSIS — M545 Low back pain: Secondary | ICD-10-CM

## 2012-05-26 ENCOUNTER — Other Ambulatory Visit: Payer: Self-pay | Admitting: Obstetrics and Gynecology

## 2012-05-26 DIAGNOSIS — Z1231 Encounter for screening mammogram for malignant neoplasm of breast: Secondary | ICD-10-CM

## 2012-06-09 ENCOUNTER — Telehealth: Payer: Self-pay

## 2012-06-09 DIAGNOSIS — F419 Anxiety disorder, unspecified: Secondary | ICD-10-CM

## 2012-06-09 DIAGNOSIS — M545 Low back pain: Secondary | ICD-10-CM

## 2012-06-09 DIAGNOSIS — R52 Pain, unspecified: Secondary | ICD-10-CM

## 2012-06-09 MED ORDER — VILAZODONE HCL 40 MG PO TABS: 40.0000 mg | ORAL_TABLET | Freq: Every day | ORAL | Status: DC

## 2012-06-09 MED ORDER — CYCLOBENZAPRINE HCL 10 MG PO TABS: 10.0000 mg | ORAL_TABLET | Freq: Two times a day (BID) | ORAL | Status: DC | PRN

## 2012-06-09 MED ORDER — GABAPENTIN 300 MG PO CAPS: 300.0000 mg | ORAL_CAPSULE | Freq: Two times a day (BID) | ORAL | Status: DC

## 2012-06-09 MED ORDER — FOLIC ACID 1 MG PO TABS: 2.0000 mg | ORAL_TABLET | Freq: Every day | ORAL | Status: DC

## 2012-06-09 NOTE — Telephone Encounter (Signed)
We got a request from [facility] requesting 90 day supply for Viibryd and Cyclobenzaprine? Please advise refill.  Fax number [phone]

## 2012-06-09 NOTE — Telephone Encounter (Signed)
OK to give 90 day supply with 1 rf on viibryd and 90 with no refill on Cyclobenzaprine

## 2012-06-10 ENCOUNTER — Other Ambulatory Visit: Payer: Self-pay

## 2012-06-20 ENCOUNTER — Encounter: Payer: Self-pay | Admitting: Family Medicine

## 2012-06-20 ENCOUNTER — Ambulatory Visit (INDEPENDENT_AMBULATORY_CARE_PROVIDER_SITE_OTHER): Payer: BC Managed Care – PPO | Admitting: Family Medicine

## 2012-06-20 VITALS — BP 113/80 | HR 75 | Temp 99.1°F | Ht 61.0 in | Wt 115.4 lb

## 2012-06-20 DIAGNOSIS — M25551 Pain in right hip: Secondary | ICD-10-CM

## 2012-06-20 DIAGNOSIS — Z23 Encounter for immunization: Secondary | ICD-10-CM

## 2012-06-20 DIAGNOSIS — E559 Vitamin D deficiency, unspecified: Secondary | ICD-10-CM

## 2012-06-20 DIAGNOSIS — F17200 Nicotine dependence, unspecified, uncomplicated: Secondary | ICD-10-CM

## 2012-06-20 DIAGNOSIS — L089 Local infection of the skin and subcutaneous tissue, unspecified: Secondary | ICD-10-CM

## 2012-06-20 DIAGNOSIS — T148XXA Other injury of unspecified body region, initial encounter: Secondary | ICD-10-CM

## 2012-06-20 DIAGNOSIS — M25559 Pain in unspecified hip: Secondary | ICD-10-CM

## 2012-06-20 HISTORY — DX: Local infection of the skin and subcutaneous tissue, unspecified: L08.9

## 2012-06-20 MED ORDER — DICLOFENAC SODIUM 3 % TD GEL: TRANSDERMAL | Status: DC

## 2012-06-20 NOTE — Progress Notes (Signed)
Patient ID: Autumn Myers, female   DOB: 1960/10/08, 51 y.o.   MRN: [id number] MEIKO IVES [id number] 18-Nov-1960 06/20/2012      Progress Note-Follow Up  Subjective  Chief Complaint  Chief Complaint  Patient presents with  . Follow-up    3 month    HPI  Patient is a 51 year old Caucasian female who is in today for followup. She continues to struggle with daily right hip pain but MRI did not show any significant pathology only confirmed degenerative disc disease. She was offered a referral to pain management and declined. Is able to continue working but does struggle with daily pain. Did recently have a biopsy of the skin over her right posterior hip. The results were benign but unfortunately didn't get infected. She is now on mupirocin ointment a couple times a day and it is healing well. She denies fevers chills. Denies headaches, chest pain palpitations, shortness of breath, GI or GU complaints. Continues to struggle with some low-grade anxiety depression but it is slowly improving on current medications. Her right hip pain does radiate down her right leg as far as the knee at times.  Past Medical History  Diagnosis Date  . Allergy   . Anxiety   . Depression   . Chicken pox as a child  . Vitamin D deficiency   . Smoker   . Depression with anxiety   . Preventative health care 10/13/2011  . Allergic state 11/20/2011  . Headache 11/20/2011  . Myalgia 11/20/2011  . Sinusitis acute 01/29/2012  . Hematuria 01/29/2012  . Renal lithiasis 01/29/2012  . Hip pain, right 01/29/2012  . Puncture wound of thigh, right 02/03/2012  . Wound infection 06/20/2012    Past Surgical History  Procedure Date  . Carpal tunnel release     on right and left  . Cesarean section 1994    Family History  Problem Relation Age of Onset  . Hypertension Mother   . Stroke Father   . Leukemia Father   . Hypertension Father   . Cancer Father     leukemia  . Depression Sister   . Anxiety disorder Sister     . Heart disease Maternal Grandfather     History   Social History  . Marital Status: Legally Separated    Spouse Name: N/A    Number of Children: N/A  . Years of Education: N/A   Occupational History  . Not on file.   Social History Main Topics  . Smoking status: Current Every Day Smoker -- 1.0 packs/day for 15 years    Types: Cigarettes  . Smokeless tobacco: Never Used  . Alcohol Use: No  . Drug Use: No  . Sexually Active: Not Currently   Other Topics Concern  . Not on file   Social History Narrative  . No narrative on file    Current Outpatient Prescriptions on File Prior to Visit  Medication Sig Dispense Refill  . cyclobenzaprine (FLEXERIL) 10 MG tablet Take 1 tablet (10 mg total) by mouth 2 (two) times daily as needed for muscle spasms.  90 tablet  0  . ergocalciferol (VITAMIN D2) 50000 UNITS capsule Take 50,000 Units by mouth once a week.      . folic acid (FOLVITE) 1 MG tablet Take 2 tablets (2 mg total) by mouth daily.  180 tablet  1  . gabapentin (NEURONTIN) 300 MG capsule Take 1 capsule (300 mg total) by mouth 2 (two) times daily.  180 capsule  1  .  loratadine (CLARITIN) 10 MG tablet Take 1 tablet (10 mg total) by mouth daily.  30 tablet  5  . minocycline (MINOCIN,DYNACIN) 100 MG capsule Take 100 mg by mouth daily.      . Vilazodone HCl (VIIBRYD) 40 MG TABS Take 1 tablet (40 mg total) by mouth daily.  90 tablet  1    Allergies  Allergen Reactions  . Neosporin (Neomycin-Bacitracin Zn-Polymyx)     Inflamed, itchy   . Penicillins   . Codeine     GI upset    Review of Systems  Review of Systems  Constitutional: Negative for fever and malaise/fatigue.  HENT: Negative for congestion.   Eyes: Negative for discharge.  Respiratory: Negative for shortness of breath.   Cardiovascular: Negative for chest pain, palpitations and leg swelling.  Gastrointestinal: Negative for nausea, abdominal pain and diarrhea.  Genitourinary: Negative for dysuria.   Musculoskeletal: Positive for joint pain. Negative for falls.       Right hip pain with radicular symptoms down leg  Skin: Negative for rash.  Neurological: Negative for loss of consciousness and headaches.  Endo/Heme/Allergies: Negative for polydipsia.  Psychiatric/Behavioral: Positive for depression. Negative for suicidal ideas. The patient is not nervous/anxious and does not have insomnia.     Objective  BP 113/80  Pulse 75  Temp 99.1 F (37.3 C) (Temporal)  Ht 5\' 1"  (1.549 m)  Wt 115 lb 6.4 oz (52.345 kg)  BMI 21.80 kg/m2  SpO2 99%  LMP 11/16/2008  Physical Exam  Physical Exam  Constitutional: She is oriented to person, place, and time and well-developed, well-nourished, and in no distress. No distress.  HENT:  Head: Normocephalic and atraumatic.  Eyes: Conjunctivae normal are normal.  Neck: Neck supple. No thyromegaly present.  Cardiovascular: Normal rate, regular rhythm and normal heart sounds.   No murmur heard. Pulmonary/Chest: Effort normal and breath sounds normal. She has no wheezes.  Abdominal: She exhibits no distension and no mass.  Musculoskeletal: She exhibits no edema.  Lymphadenopathy:    She has no cervical adenopathy.  Neurological: She is alert and oriented to person, place, and time.  Skin: Skin is warm and dry. No rash noted. She is not diaphoretic.  Psychiatric: Memory, affect and judgment normal.    Lab Results  Component Value Date   TSH 2.18 11/20/2011   Lab Results  Component Value Date   WBC 7.8 11/20/2011   HGB 13.2 11/20/2011   HCT 39.8 11/20/2011   MCV 93.8 11/20/2011   PLT 250.0 11/20/2011   Lab Results  Component Value Date   CREATININE 0.9 11/20/2011   BUN 9 11/20/2011   NA 141 11/20/2011   K 3.7 11/20/2011   CL 104 11/20/2011   CO2 27 11/20/2011   Lab Results  Component Value Date   ALT 14 11/20/2011   AST 23 11/20/2011   ALKPHOS 77 11/20/2011   BILITOT 0.4 11/20/2011   Lab Results  Component Value Date   CHOL 158 11/20/2011    Lab Results  Component Value Date   HDL 55.50 11/20/2011   Lab Results  Component Value Date   LDLCALC 92 11/20/2011   Lab Results  Component Value Date   TRIG 55.0 11/20/2011   Lab Results  Component Value Date   CHOLHDL 3 11/20/2011     Assessment & Plan  Hip pain, right Had MRI of hipwith ortho which showed DDD. She continues to struggle with daily pain, was offered pain management but declines, may still consider in future. She  is given a prescription for Transdermal therapeutics: Diclofenac, baclofen, cyclobenzaprine, gabapentin, bupivacaine combination to apply up to 4 times a day and see if that's helpful.  Smoker Encouraged complete cessation once again. She has been slowly cutting back and does feels she's doing somewhat better  Wound infection Status post shave biopsy of right posterior hip. It became inflamed and irritated. She is now on mupirocin ointment several times a day and that seems to be helping. The lesion appears to be healing well today  Vitamin D deficiency Normal levels when checked in April. Patient given a copy of labs today. We'll repeat with annual labs in 5-6 months.

## 2012-06-20 NOTE — Assessment & Plan Note (Signed)
Normal levels when checked in April. Patient given a copy of labs today. We'll repeat with annual labs in 5-6 months.

## 2012-06-20 NOTE — Patient Instructions (Addendum)
Nicotine Addiction Nicotine can act as both a stimulant (excites/activates) and a sedative (calms/quiets). Immediately after exposure to nicotine, there is a "kick" caused in part by the drug's stimulation of the adrenal glands and resulting discharge of adrenaline (epinephrine). The rush of adrenaline stimulates the body and causes a sudden release of sugar. This means that smokers are always slightly hyperglycemic. Hyperglycemic means that the blood sugar is high, just like in diabetics. Nicotine also decreases the amount of insulin which helps control sugar levels in the body. There is an increase in blood pressure, breathing, and the rate of heart beats.  In addition, nicotine indirectly causes a release of dopamine in the brain that controls pleasure and motivation. A similar reaction is seen with other drugs of abuse, such as cocaine and heroin. This dopamine release is thought to cause the pleasurable sensations when smoking. In some different cases, nicotine can also create a calming effect, depending on sensitivity of the smoker's nervous system and the dose of nicotine taken. WHAT HAPPENS WHEN NICOTINE IS TAKEN FOR LONG PERIODS OF TIME?  Long-term use of nicotine results in addiction. It is difficult to stop.  Repeated use of nicotine creates tolerance. Higher doses of nicotine are needed to get the "kick." When nicotine use is stopped, withdrawal may last a month or more. Withdrawal may begin within a few hours after the last cigarette. Symptoms peak within the first few days and may lessen within a few weeks. For some people, however, symptoms may last for months or longer. Withdrawal symptoms include:   Irritability.  Craving.  Learning and attention deficits.  Sleep disturbances.  Increased appetite. Craving for tobacco may last for 6 months or longer. Many behaviors done while using nicotine can also play a part in the severity of withdrawal symptoms. For some people, the feel,  smell, and sight of a cigarette and the ritual of obtaining, handling, lighting, and smoking the cigarette are closely linked with the pleasure of smoking. When stopped, they also miss the related behaviors which make the withdrawal or craving worse. While nicotine gum and patches may lessen the drug aspects of withdrawal, cravings often persist. WHAT ARE THE MEDICAL CONSEQUENCES OF NICOTINE USE?  Nicotine addiction accounts for one-third of all cancers. The top cancer caused by tobacco is lung cancer. Lung cancer is the number one cancer killer of both men and women.  Smoking is also associated with cancers of the:  Mouth.  Pharynx.  Larynx.  Esophagus.  Stomach.  Pancreas.  Cervix.  Kidney.  Ureter.  Bladder.  Smoking also causes lung diseases such as lasting (chronic) bronchitis and emphysema.  It worsens asthma in adults and children.  Smoking increases the risk of heart disease, including:  Stroke.  Heart attack.  Vascular disease.  Aneurysm.  Passive or secondary smoke can also increase medical risks including:  Asthma in children.  Sudden Infant Death Syndrome (SIDS).  Additionally, dropped cigarettes are the leading cause of residential fire fatalities.  Nicotine poisoning has been reported from accidental ingestion of tobacco products by children and pets. Death usually results in a few minutes from respiratory failure (when a person stops breathing) caused by paralysis. TREATMENT   Medication. Nicotine replacement medicines such as nicotine gum and the patch are used to stop smoking. These medicines gradually lower the dosage of nicotine in the body. These medicines do not contain the carbon monoxide and other toxins found in tobacco smoke.  Hypnotherapy.  Relaxation therapy.  Nicotine Anonymous (a 12-step support   program). Find times and locations in your local yellow pages. Document Released: 03/25/2004 Document Revised: 10/12/2011 Document  Reviewed: 08/17/2007 [employer] Patient Information 2013 [employer].  

## 2012-06-20 NOTE — Assessment & Plan Note (Signed)
Encouraged complete cessation once again. She has been slowly cutting back and does feels she's doing somewhat better

## 2012-06-20 NOTE — Assessment & Plan Note (Signed)
Status post shave biopsy of right posterior hip. It became inflamed and irritated. She is now on mupirocin ointment several times a day and that seems to be helping. The lesion appears to be healing well today

## 2012-06-20 NOTE — Assessment & Plan Note (Signed)
Had MRI of hipwith ortho which showed DDD. She continues to struggle with daily pain, was offered pain management but declines, may still consider in future. She is given a prescription for Transdermal therapeutics: Diclofenac, baclofen, cyclobenzaprine, gabapentin, bupivacaine combination to apply up to 4 times a day and see if that's helpful.

## 2012-06-24 ENCOUNTER — Ambulatory Visit: Payer: BC Managed Care – PPO | Admitting: Family Medicine

## 2012-07-05 ENCOUNTER — Ambulatory Visit
Admission: RE | Admit: 2012-07-05 | Discharge: 2012-07-05 | Disposition: A | Discharge status: Home / Self Care | Payer: BC Managed Care – PPO | Source: Ambulatory Visit | Attending: Obstetrics and Gynecology | Admitting: Obstetrics and Gynecology

## 2012-07-05 DIAGNOSIS — Z1231 Encounter for screening mammogram for malignant neoplasm of breast: Secondary | ICD-10-CM

## 2012-08-03 HISTORY — PX: COLONOSCOPY W/ POLYPECTOMY: SHX1380

## 2012-08-16 ENCOUNTER — Ambulatory Visit (INDEPENDENT_AMBULATORY_CARE_PROVIDER_SITE_OTHER): Payer: BC Managed Care – PPO | Admitting: Family Medicine

## 2012-08-16 ENCOUNTER — Encounter: Payer: Self-pay | Admitting: Family Medicine

## 2012-08-16 VITALS — BP 113/76 | HR 72 | Temp 97.8°F | Ht 61.0 in | Wt 113.8 lb

## 2012-08-16 DIAGNOSIS — F17200 Nicotine dependence, unspecified, uncomplicated: Secondary | ICD-10-CM

## 2012-08-16 DIAGNOSIS — J329 Chronic sinusitis, unspecified: Secondary | ICD-10-CM

## 2012-08-16 DIAGNOSIS — T7840XA Allergy, unspecified, initial encounter: Secondary | ICD-10-CM

## 2012-08-16 DIAGNOSIS — F341 Dysthymic disorder: Secondary | ICD-10-CM

## 2012-08-16 DIAGNOSIS — F418 Other specified anxiety disorders: Secondary | ICD-10-CM

## 2012-08-16 DIAGNOSIS — J019 Acute sinusitis, unspecified: Secondary | ICD-10-CM

## 2012-08-16 MED ORDER — GUAIFENESIN ER 600 MG PO TB12: 600.0000 mg | ORAL_TABLET | Freq: Two times a day (BID) | ORAL | Status: DC

## 2012-08-16 MED ORDER — SULFAMETHOXAZOLE-TRIMETHOPRIM 800-160 MG PO TABS: 1.0000 | ORAL_TABLET | Freq: Two times a day (BID) | ORAL | Status: DC

## 2012-08-16 MED ORDER — GABAPENTIN 100 MG PO CAPS: 100.0000 mg | ORAL_CAPSULE | Freq: Four times a day (QID) | ORAL | Status: DC | PRN

## 2012-08-16 MED ORDER — HYDROCODONE-HOMATROPINE 5-1.5 MG/5ML PO SYRP: 5.0000 mL | ORAL_SOLUTION | Freq: Three times a day (TID) | ORAL | Status: DC | PRN

## 2012-08-16 NOTE — Patient Instructions (Addendum)

## 2012-08-17 NOTE — Assessment & Plan Note (Signed)
Encouraged loratadine and nasal saline daily

## 2012-08-17 NOTE — Progress Notes (Signed)
Patient ID: Autumn Myers, female   DOB: 1961/06/03, 52 y.o.   MRN: [id number] RAHMAH MCCAMY [id number] 1961/04/11 08/17/2012      Progress Note-Follow Up  Subjective  Chief Complaint  Chief Complaint  Patient presents with  . Cough  . Sinusitis    HPI  Patient is a 52 year old Caucasian female who today with a two-week history of worsening respiratory symptoms. She reports she's been struggling with a cough about 2 weeks. At this point is productive of yellow phlegm. He has ongoing shortness of breath and malaise. She reports a cough which is keeping her up at night. She is also noted. His mild headache as well as yellow nasal discharge. She denies fevers or chills. She denies chest pain or palpitations. No GI or GU complaints otherwise noted. She does work in health care and has been close to implant an oral virus in the last month. She tried ibuprofen and that does get some mild relief from the discomfort but symptoms return quickly  Past Medical History  Diagnosis Date  . Allergy   . Anxiety   . Depression   . Chicken pox as a child  . Vitamin D deficiency   . Smoker   . Depression with anxiety   . Preventative health care 10/13/2011  . Allergic state 11/20/2011  . Headache 11/20/2011  . Myalgia 11/20/2011  . Sinusitis acute 01/29/2012  . Hematuria 01/29/2012  . Renal lithiasis 01/29/2012  . Hip pain, right 01/29/2012  . Puncture wound of thigh, right 02/03/2012  . Wound infection 06/20/2012    Past Surgical History  Procedure Date  . Carpal tunnel release     on right and left  . Cesarean section 1994    Family History  Problem Relation Age of Onset  . Hypertension Mother   . Stroke Father   . Leukemia Father   . Hypertension Father   . Cancer Father     leukemia  . Depression Sister   . Anxiety disorder Sister   . Heart disease Maternal Grandfather     History   Social History  . Marital Status: Legally Separated    Spouse Name: N/A    Number of Children:  N/A  . Years of Education: N/A   Occupational History  . Not on file.   Social History Main Topics  . Smoking status: Current Every Day Smoker -- 1.0 packs/day for 15 years    Types: Cigarettes  . Smokeless tobacco: Never Used  . Alcohol Use: No  . Drug Use: No  . Sexually Active: Not Currently   Other Topics Concern  . Not on file   Social History Narrative  . No narrative on file    Current Outpatient Prescriptions on File Prior to Visit  Medication Sig Dispense Refill  . cyclobenzaprine (FLEXERIL) 10 MG tablet Take 1 tablet (10 mg total) by mouth 2 (two) times daily as needed for muscle spasms.  90 tablet  0  . Diclofenac Sodium 3 % GEL Transdermal therapeutics: bursitis advanced formula with neuropathic component. Diclofenac 3%, baclofen 2%, cyclobenzaprine percent, gabapentin 6%, bupivacaine 2%. Apply 1-2 g 4 times a day when necessary pain to affected area      . ergocalciferol (VITAMIN D2) 50000 UNITS capsule Take 50,000 Units by mouth once a week.      . folic acid (FOLVITE) 1 MG tablet Take 2 tablets (2 mg total) by mouth daily.  180 tablet  1  . loratadine (CLARITIN) 10 MG tablet  Take 1 tablet (10 mg total) by mouth daily.  30 tablet  5  . minocycline (MINOCIN,DYNACIN) 100 MG capsule Take 100 mg by mouth daily.      . Vilazodone HCl (VIIBRYD) 40 MG TABS Take 1 tablet (40 mg total) by mouth daily.  90 tablet  1    Allergies  Allergen Reactions  . Neosporin (Neomycin-Bacitracin Zn-Polymyx)     Inflamed, itchy   . Penicillins   . Codeine     GI upset    Review of Systems  Review of Systems  Constitutional: Positive for malaise/fatigue. Negative for fever and chills.  HENT: Positive for congestion and neck pain. Negative for hearing loss and nosebleeds.   Eyes: Negative for discharge.  Respiratory: Positive for cough, sputum production and wheezing. Negative for shortness of breath.   Cardiovascular: Negative for chest pain, palpitations and leg swelling.    Gastrointestinal: Positive for nausea. Negative for heartburn, vomiting, abdominal pain, diarrhea, constipation and blood in stool.  Genitourinary: Negative for dysuria, urgency, frequency and hematuria.  Musculoskeletal: Negative for myalgias, back pain and falls.  Skin: Negative for rash.  Neurological: Positive for headaches. Negative for dizziness, tremors, sensory change, focal weakness, loss of consciousness and weakness.  Endo/Heme/Allergies: Negative for polydipsia. Does not bruise/bleed easily.  Psychiatric/Behavioral: Negative for depression and suicidal ideas. The patient is not nervous/anxious and does not have insomnia.     Objective  BP 113/76  Pulse 72  Temp 97.8 F (36.6 C) (Temporal)  Ht 5\' 1"  (1.549 m)  Wt 113 lb 12.8 oz (51.619 kg)  BMI 21.50 kg/m2  SpO2 98%  LMP 11/16/2008  Physical Exam  Physical Exam  Constitutional: She is oriented to person, place, and time and well-developed, well-nourished, and in no distress. No distress.  HENT:  Head: Normocephalic and atraumatic.       TMs dull with air fluid levels noted, mildly erythematous  Eyes: Conjunctivae normal are normal.  Neck: Neck supple. No thyromegaly present.  Cardiovascular: Normal rate, regular rhythm and normal heart sounds.   No murmur heard. Pulmonary/Chest: Effort normal and breath sounds normal. She has no wheezes.       Rhonchi b/l bases, scattered expiratory wheezing  Abdominal: Soft. Bowel sounds are normal. She exhibits no distension and no mass.  Musculoskeletal: She exhibits no edema.  Lymphadenopathy:    She has cervical adenopathy.  Neurological: She is alert and oriented to person, place, and time.  Skin: Skin is warm and dry. No rash noted. She is not diaphoretic.  Psychiatric: Memory, affect and judgment normal.    Lab Results  Component Value Date   TSH 2.18 11/20/2011   Lab Results  Component Value Date   WBC 7.8 11/20/2011   HGB 13.2 11/20/2011   HCT 39.8 11/20/2011    MCV 93.8 11/20/2011   PLT 250.0 11/20/2011   Lab Results  Component Value Date   CREATININE 0.9 11/20/2011   BUN 9 11/20/2011   NA 141 11/20/2011   K 3.7 11/20/2011   CL 104 11/20/2011   CO2 27 11/20/2011   Lab Results  Component Value Date   ALT 14 11/20/2011   AST 23 11/20/2011   ALKPHOS 77 11/20/2011   BILITOT 0.4 11/20/2011   Lab Results  Component Value Date   CHOL 158 11/20/2011   Lab Results  Component Value Date   HDL 55.50 11/20/2011   Lab Results  Component Value Date   LDLCALC 92 11/20/2011   Lab Results  Component Value Date  TRIG 55.0 11/20/2011   Lab Results  Component Value Date   CHOLHDL 3 11/20/2011     Assessment & Plan  Smoker Smoking less than half pack per day but is encouraged to try complete cessation once again. May try ecigarette or other nicotine replacement product as indicated  Sinusitis, acute Recurrent. Started on antibiotics and Mucinex today. Increase rest and hydration. Given some Hycodan and asked to notify us if symptoms do not improve  Depression with anxiety She reports Gabapentin was given to her for anxiety. She feels 100-200 mg helps her better than 300 mg and she would like to go back to the lower dose.  Allergic state Encouraged loratadine and nasal saline daily

## 2012-08-17 NOTE — Assessment & Plan Note (Signed)
She reports Gabapentin was given to her for anxiety. She feels 100-200 mg helps her better than 300 mg and she would like to go back to the lower dose.

## 2012-08-17 NOTE — Assessment & Plan Note (Signed)
Recurrent. Started on antibiotics and Mucinex today. Increase rest and hydration. Given some Hycodan and asked to notify us if symptoms do not improve

## 2012-08-17 NOTE — Assessment & Plan Note (Signed)
Smoking less than half pack per day but is encouraged to try complete cessation once again. May try ecigarette or other nicotine replacement product as indicated

## 2012-08-23 ENCOUNTER — Other Ambulatory Visit: Payer: Self-pay

## 2012-08-23 DIAGNOSIS — F418 Other specified anxiety disorders: Secondary | ICD-10-CM

## 2012-11-15 ENCOUNTER — Other Ambulatory Visit: Payer: Self-pay | Admitting: Family Medicine

## 2012-11-15 DIAGNOSIS — M545 Low back pain, unspecified: Secondary | ICD-10-CM

## 2012-11-15 DIAGNOSIS — F418 Other specified anxiety disorders: Secondary | ICD-10-CM

## 2012-11-15 NOTE — Telephone Encounter (Signed)
Ok to refill Flexeril and gabapentin at same sig, strength, number and refills

## 2012-11-15 NOTE — Telephone Encounter (Signed)
Please advise flexeril refill? Last RX was wrote on 06-09-12 quantity 90 with 0 refills. Also Gabapentin refill last RX 08-16-12 quantity 360 with 1 refill.

## 2012-11-16 MED ORDER — GABAPENTIN 100 MG PO CAPS: 100.0000 mg | ORAL_CAPSULE | Freq: Four times a day (QID) | ORAL | Status: DC | PRN

## 2012-11-16 MED ORDER — CYCLOBENZAPRINE HCL 10 MG PO TABS: 10.0000 mg | ORAL_TABLET | Freq: Two times a day (BID) | ORAL | Status: DC | PRN

## 2012-12-14 ENCOUNTER — Encounter: Payer: BC Managed Care – PPO | Admitting: Family Medicine

## 2013-01-03 ENCOUNTER — Encounter: Payer: BC Managed Care – PPO | Admitting: Family Medicine

## 2013-01-24 ENCOUNTER — Other Ambulatory Visit: Payer: Self-pay | Admitting: Family Medicine

## 2013-01-24 ENCOUNTER — Ambulatory Visit (INDEPENDENT_AMBULATORY_CARE_PROVIDER_SITE_OTHER): Payer: BC Managed Care – PPO | Admitting: Family Medicine

## 2013-01-24 ENCOUNTER — Encounter: Payer: Self-pay | Admitting: Family Medicine

## 2013-01-24 VITALS — BP 120/72 | HR 70 | Temp 97.9°F | Ht 61.0 in | Wt 110.0 lb

## 2013-01-24 DIAGNOSIS — J019 Acute sinusitis, unspecified: Secondary | ICD-10-CM

## 2013-01-24 DIAGNOSIS — M25551 Pain in right hip: Secondary | ICD-10-CM

## 2013-01-24 DIAGNOSIS — M545 Low back pain: Secondary | ICD-10-CM

## 2013-01-24 DIAGNOSIS — R946 Abnormal results of thyroid function studies: Secondary | ICD-10-CM

## 2013-01-24 DIAGNOSIS — M25559 Pain in unspecified hip: Secondary | ICD-10-CM

## 2013-01-24 DIAGNOSIS — R52 Pain, unspecified: Secondary | ICD-10-CM

## 2013-01-24 DIAGNOSIS — R5381 Other malaise: Secondary | ICD-10-CM

## 2013-01-24 DIAGNOSIS — F418 Other specified anxiety disorders: Secondary | ICD-10-CM

## 2013-01-24 DIAGNOSIS — Z0000 Encounter for general adult medical examination without abnormal findings: Secondary | ICD-10-CM

## 2013-01-24 DIAGNOSIS — R5383 Other fatigue: Secondary | ICD-10-CM

## 2013-01-24 DIAGNOSIS — F341 Dysthymic disorder: Secondary | ICD-10-CM

## 2013-01-24 LAB — HEPATIC FUNCTION PANEL
AST: 19 U/L (ref 0–37)
Albumin: 4.6 g/dL (ref 3.5–5.2)
Alkaline Phosphatase: 79 U/L (ref 39–117)
Total Protein: 7.2 g/dL (ref 6.0–8.3)

## 2013-01-24 LAB — CBC
HCT: 38.3 % (ref 36.0–46.0)
MCV: 86.7 fL (ref 78.0–100.0)
RBC: 4.42 MIL/uL (ref 3.87–5.11)
WBC: 7.4 10*3/uL (ref 4.0–10.5)

## 2013-01-24 LAB — RENAL FUNCTION PANEL
BUN: 10 mg/dL (ref 6–23)
CO2: 27 mEq/L (ref 19–32)
Chloride: 104 mEq/L (ref 96–112)
Creat: 0.96 mg/dL (ref 0.50–1.10)

## 2013-01-24 MED ORDER — GABAPENTIN 100 MG PO CAPS: 100.0000 mg | ORAL_CAPSULE | Freq: Four times a day (QID) | ORAL | Status: DC | PRN

## 2013-01-24 MED ORDER — VILAZODONE HCL 40 MG PO TABS: 40.0000 mg | ORAL_TABLET | Freq: Every day | ORAL | Status: DC

## 2013-01-24 MED ORDER — CYCLOBENZAPRINE HCL 10 MG PO TABS: 10.0000 mg | ORAL_TABLET | Freq: Two times a day (BID) | ORAL | Status: DC | PRN

## 2013-01-24 MED ORDER — ALPRAZOLAM 0.25 MG PO TABS: 0.2500 mg | ORAL_TABLET | Freq: Two times a day (BID) | ORAL | Status: DC | PRN

## 2013-01-24 NOTE — Patient Instructions (Addendum)
Moist heat and then apply salon pas cream and patch  Next visit annual exam Increase Gabapentin to twice a day if pain persists  Back Pain, Adult Low back pain is very common. About 1 in 5 people have back pain.The cause of low back pain is rarely dangerous. The pain often gets better over time.About half of people with a sudden onset of back pain feel better in just 2 weeks. About 8 in 10 people feel better by 6 weeks.  CAUSES Some common causes of back pain include:  Strain of the muscles or ligaments supporting the spine.  Wear and tear (degeneration) of the spinal discs.  Arthritis.  Direct injury to the back. DIAGNOSIS Most of the time, the direct cause of low back pain is not known.However, back pain can be treated effectively even when the exact cause of the pain is unknown.Answering your caregiver's questions about your overall health and symptoms is one of the most accurate ways to make sure the cause of your pain is not dangerous. If your caregiver needs more information, he or she may order lab work or imaging tests (X-rays or MRIs).However, even if imaging tests show changes in your back, this usually does not require surgery. HOME CARE INSTRUCTIONS For many people, back pain returns.Since low back pain is rarely dangerous, it is often a condition that people can learn to [facility] their own.   Remain active. It is stressful on the back to sit or stand in one place. Do not sit, drive, or stand in one place for more than 30 minutes at a time. Take short walks on level surfaces as soon as pain allows.Try to increase the length of time you walk each day.  Do not stay in bed.Resting more than 1 or 2 days can delay your recovery.  Do not avoid exercise or work.Your body is made to move.It is not dangerous to be active, even though your back may hurt.Your back will likely heal faster if you return to being active before your pain is gone.  Pay attention to your body when  you bend and lift. Many people have less discomfortwhen lifting if they bend their knees, keep the load close to their bodies,and avoid twisting. Often, the most comfortable positions are those that put less stress on your recovering back.  Find a comfortable position to sleep. Use a firm mattress and lie on your side with your knees slightly bent. If you lie on your back, put a pillow under your knees.  Only take over-the-counter or prescription medicines as directed by your caregiver. Over-the-counter medicines to reduce pain and inflammation are often the most helpful.Your caregiver may prescribe muscle relaxant drugs.These medicines help dull your pain so you can more quickly return to your normal activities and healthy exercise.  Put ice on the injured area.  Put ice in a plastic bag.  Place a towel between your skin and the bag.  Leave the ice on for 15-20 minutes, 3-4 times a day for the first 2 to 3 days. After that, ice and heat may be alternated to reduce pain and spasms.  Ask your caregiver about trying back exercises and gentle massage. This may be of some benefit.  Avoid feeling anxious or stressed.Stress increases muscle tension and can worsen back pain.It is important to recognize when you are anxious or stressed and learn ways to manage it.Exercise is a great option. SEEK MEDICAL CARE IF:  You have pain that is not relieved with rest or  medicine.  You have pain that does not improve in 1 week.  You have new symptoms.  You are generally not feeling well. SEEK IMMEDIATE MEDICAL CARE IF:   You have pain that radiates from your back into your legs.  You develop new bowel or bladder control problems.  You have unusual weakness or numbness in your arms or legs.  You develop nausea or vomiting.  You develop abdominal pain.  You feel faint. Document Released: 07/20/2005 Document Revised: 01/19/2012 Document Reviewed: 12/08/2010 [facility] Patient Information  2014 [place], [place].

## 2013-01-28 ENCOUNTER — Encounter: Payer: Self-pay | Admitting: Family Medicine

## 2013-01-28 DIAGNOSIS — R946 Abnormal results of thyroid function studies: Secondary | ICD-10-CM | POA: Insufficient documentation

## 2013-01-28 HISTORY — DX: Abnormal results of thyroid function studies: R94.6

## 2013-01-28 NOTE — Assessment & Plan Note (Signed)
Resolved with treatment 

## 2013-01-28 NOTE — Assessment & Plan Note (Signed)
Doing well on current meds no changes 

## 2013-01-28 NOTE — Assessment & Plan Note (Signed)
Continues to c/o right shoulder and hip pain and low back pain, try topical treatments such as salon pas. Stay active. Follow up with ortho

## 2013-01-28 NOTE — Assessment & Plan Note (Signed)
Elevated tsh but normal T4 will repeat studies in roughly 3 months

## 2013-01-28 NOTE — Progress Notes (Signed)
Patient ID: Autumn Myers, female   DOB: [birth year], 52 y.o.   MRN: [id number] Autumn Myers [id number] 14-Jul-1961 01/28/2013      Progress Note-Follow Up  Subjective  Chief Complaint  Chief Complaint  Patient presents with  . Follow-up    on medications    HPI  Patient is 52 year old female who is in today for followup. Generally doing well. Is noting an improvement in her depression and anxiety. No suicidal ideation. No recent fevers, chills, chest pain no palpitations, shortness of, GI or GU complaints. Taking medications as prescribed. Is having some shoulder and back and hip pain. No intravenous. Notes mostly stiffness and mild discomfort  Past Medical History  Diagnosis Date  . Allergy   . Anxiety   . Depression   . Chicken pox as a child  . Vitamin D deficiency   . Smoker   . Depression with anxiety   . Preventative health care 10/13/2011  . Allergic state 11/20/2011  . Headache(784.0) 11/20/2011  . Myalgia 11/20/2011  . Sinusitis acute 01/29/2012  . Hematuria 01/29/2012  . Renal lithiasis 01/29/2012  . Hip pain, right 01/29/2012  . Puncture wound of thigh, right 02/03/2012  . Wound infection 06/20/2012  . Abnormal results of thyroid function studies 01/28/2013    Past Surgical History  Procedure Laterality Date  . Carpal tunnel release      on right and left  . Cesarean section  1994    Family History  Problem Relation Age of Onset  . Hypertension Mother   . Stroke Father   . Leukemia Father   . Hypertension Father   . Cancer Father     leukemia  . Depression Sister   . Anxiety disorder Sister   . Heart disease Maternal Grandfather     History   Social History  . Marital Status: Legally Separated    Spouse Name: N/A    Number of Children: N/A  . Years of Education: N/A   Occupational History  . Not on file.   Social History Main Topics  . Smoking status: Current Every Day Smoker -- 1.00 packs/day for 15 years    Types: Cigarettes  . Smokeless  tobacco: Never Used  . Alcohol Use: No  . Drug Use: No  . Sexually Active: Not Currently   Other Topics Concern  . Not on file   Social History Narrative  . No narrative on file    Current Outpatient Prescriptions on File Prior to Visit  Medication Sig Dispense Refill  . ergocalciferol (VITAMIN D2) 50000 UNITS capsule Take 50,000 Units by mouth once a week.      . folic acid (FOLVITE) 1 MG tablet Take 2 tablets (2 mg total) by mouth daily.  180 tablet  1  . minocycline (MINOCIN,DYNACIN) 100 MG capsule Take 100 mg by mouth daily.       No current facility-administered medications on file prior to visit.    Allergies  Allergen Reactions  . Neosporin (Neomycin-Bacitracin Zn-Polymyx)     Inflamed, itchy   . Penicillins   . Codeine     GI upset    Review of Systems  Review of Systems  Constitutional: Negative for fever and malaise/fatigue.  HENT: Negative for congestion.   Eyes: Negative for pain and discharge.  Respiratory: Negative for shortness of breath.   Cardiovascular: Negative for chest pain, palpitations and leg swelling.  Gastrointestinal: Negative for nausea, abdominal pain and diarrhea.  Genitourinary: Negative for dysuria.  Musculoskeletal: Positive  for myalgias, back pain and joint pain. Negative for falls.  Skin: Negative for rash.  Neurological: Negative for loss of consciousness and headaches.  Endo/Heme/Allergies: Negative for polydipsia.  Psychiatric/Behavioral: Negative for depression and suicidal ideas. The patient is not nervous/anxious and does not have insomnia.     Objective  BP 120/72  Pulse 70  Temp(Src) 97.9 F (36.6 C) (Oral)  Ht 5\' 1"  (1.549 m)  Wt 110 lb 0.6 oz (49.914 kg)  BMI 20.8 kg/m2  SpO2 98%  LMP 11/16/2008  Physical Exam  Physical Exam  Constitutional: She is oriented to person, place, and time and well-developed, well-nourished, and in no distress. No distress.  HENT:  Head: Normocephalic and atraumatic.  Eyes:  Conjunctivae are normal.  Neck: Neck supple. No thyromegaly present.  Cardiovascular: Normal rate, regular rhythm and normal heart sounds.   No murmur heard. Pulmonary/Chest: Effort normal and breath sounds normal. She has no wheezes.  Abdominal: She exhibits no distension and no mass.  Musculoskeletal: She exhibits no edema.  Lymphadenopathy:    She has no cervical adenopathy.  Neurological: She is alert and oriented to person, place, and time.  Skin: Skin is warm and dry. No rash noted. She is not diaphoretic.  Psychiatric: Memory, affect and judgment normal.    Lab Results  Component Value Date   TSH 6.887* 01/24/2013   Lab Results  Component Value Date   WBC 7.4 01/24/2013   HGB 13.8 01/24/2013   HCT 38.3 01/24/2013   MCV 86.7 01/24/2013   PLT 333 01/24/2013   Lab Results  Component Value Date   CREATININE 0.96 01/24/2013   BUN 10 01/24/2013   NA 141 01/24/2013   K 4.1 01/24/2013   CL 104 01/24/2013   CO2 27 01/24/2013   Lab Results  Component Value Date   ALT 13 01/24/2013   AST 19 01/24/2013   ALKPHOS 79 01/24/2013   BILITOT 0.4 01/24/2013   Lab Results  Component Value Date   CHOL 158 11/20/2011   Lab Results  Component Value Date   HDL 55.50 11/20/2011   Lab Results  Component Value Date   LDLCALC 92 11/20/2011   Lab Results  Component Value Date   TRIG 55.0 11/20/2011   Lab Results  Component Value Date   CHOLHDL 3 11/20/2011     Assessment & Plan  Abnormal results of thyroid function studies Elevated tsh but normal T4 will repeat studies in roughly 3 months  Sinusitis, acute Resolved with treatment  Hip pain, right Continues to c/o right shoulder and hip pain and low back pain, try topical treatments such as salon pas. Stay active. Follow up with ortho  Depression with anxiety Doing well on current meds no changes.

## 2013-03-15 ENCOUNTER — Other Ambulatory Visit: Payer: Self-pay

## 2013-04-15 ENCOUNTER — Other Ambulatory Visit: Payer: Self-pay | Admitting: Family Medicine

## 2013-04-17 NOTE — Telephone Encounter (Signed)
Refill request for gabapentin  Last filled by MD on - 01/24/13 #360 x1  Refill request for cyclobenzoprine Last filled by MD on 01/24/13 #60 x2  Last Seen- 01/24/13 Next Appt: 05/08/13 Please advise refill?

## 2013-05-08 ENCOUNTER — Ambulatory Visit (INDEPENDENT_AMBULATORY_CARE_PROVIDER_SITE_OTHER): Payer: BC Managed Care – PPO | Admitting: Family Medicine

## 2013-05-08 ENCOUNTER — Encounter: Payer: Self-pay | Admitting: Family Medicine

## 2013-05-08 VITALS — BP 100/70 | HR 98 | Temp 98.3°F | Ht 61.0 in | Wt 112.0 lb

## 2013-05-08 DIAGNOSIS — Z23 Encounter for immunization: Secondary | ICD-10-CM

## 2013-05-08 DIAGNOSIS — E559 Vitamin D deficiency, unspecified: Secondary | ICD-10-CM

## 2013-05-08 DIAGNOSIS — Z8371 Family history of colonic polyps: Secondary | ICD-10-CM

## 2013-05-08 DIAGNOSIS — M542 Cervicalgia: Secondary | ICD-10-CM

## 2013-05-08 DIAGNOSIS — E785 Hyperlipidemia, unspecified: Secondary | ICD-10-CM

## 2013-05-08 DIAGNOSIS — F17200 Nicotine dependence, unspecified, uncomplicated: Secondary | ICD-10-CM

## 2013-05-08 DIAGNOSIS — F341 Dysthymic disorder: Secondary | ICD-10-CM

## 2013-05-08 DIAGNOSIS — M62838 Other muscle spasm: Secondary | ICD-10-CM

## 2013-05-08 DIAGNOSIS — Z0000 Encounter for general adult medical examination without abnormal findings: Secondary | ICD-10-CM

## 2013-05-08 DIAGNOSIS — F418 Other specified anxiety disorders: Secondary | ICD-10-CM

## 2013-05-08 LAB — CBC
MCH: 31.7 pg (ref 26.0–34.0)
MCV: 88.5 fL (ref 78.0–100.0)
Platelets: 311 10*3/uL (ref 150–400)
RBC: 4.77 MIL/uL (ref 3.87–5.11)
RDW: 12.9 % (ref 11.5–15.5)

## 2013-05-08 LAB — HEPATIC FUNCTION PANEL
ALT: 13 U/L (ref 0–35)
Alkaline Phosphatase: 87 U/L (ref 39–117)
Indirect Bilirubin: 0.2 mg/dL (ref 0.0–0.9)
Total Protein: 7 g/dL (ref 6.0–8.3)

## 2013-05-08 LAB — RENAL FUNCTION PANEL
BUN: 6 mg/dL (ref 6–23)
CO2: 32 mEq/L (ref 19–32)
Chloride: 106 mEq/L (ref 96–112)
Glucose, Bld: 53 mg/dL — ABNORMAL LOW (ref 70–99)
Potassium: 3.9 mEq/L (ref 3.5–5.3)

## 2013-05-08 LAB — LIPID PANEL: Total CHOL/HDL Ratio: 3.3 Ratio

## 2013-05-08 MED ORDER — METHOCARBAMOL 500 MG PO TABS: 500.0000 mg | ORAL_TABLET | Freq: Two times a day (BID) | ORAL | Status: DC | PRN

## 2013-05-08 MED ORDER — CYCLOBENZAPRINE HCL 10 MG PO TABS: 10.0000 mg | ORAL_TABLET | Freq: Every evening | ORAL | Status: DC | PRN

## 2013-05-08 MED ORDER — ALPRAZOLAM 0.25 MG PO TABS: 0.2500 mg | ORAL_TABLET | Freq: Two times a day (BID) | ORAL | Status: DC | PRN

## 2013-05-08 NOTE — Progress Notes (Signed)
Patient ID: Autumn Myers, female   DOB: 09/23/1960, 52 y.o.   MRN: [id number] Autumn Myers [id number] 29-Mar-1961 05/08/2013      Progress Note-Follow Up  Subjective  Chief Complaint  No chief complaint on file.   HPI  Is a 52 year old Caucasian female who is in today for annual exam. She is generally doing well he is complaining of some persistent neck and shoulder pain. It fluctuates in intensity but is present frequently. No falls or injury. No radicular symptoms. She otherwise has not had any acute concerns. The fibroid is helping her maintain her mood. Denies headaches, fevers, chills, chest pain, palpitations, shortness of breath, GI or GU concerns. Is taking medications as prescribed.  Past Medical History  Diagnosis Date  . Allergy   . Anxiety   . Depression   . Chicken pox as a child  . Vitamin D deficiency   . Smoker   . Depression with anxiety   . Preventative health care 10/13/2011  . Allergic state 11/20/2011  . Headache(784.0) 11/20/2011  . Myalgia 11/20/2011  . Sinusitis acute 01/29/2012  . Hematuria 01/29/2012  . Renal lithiasis 01/29/2012  . Hip pain, right 01/29/2012  . Puncture wound of thigh, right 02/03/2012  . Wound infection 06/20/2012  . Abnormal results of thyroid function studies 01/28/2013    Past Surgical History  Procedure Laterality Date  . Carpal tunnel release      on right and left  . Cesarean section  1994    Family History  Problem Relation Age of Onset  . Hypertension Mother   . Stroke Father   . Leukemia Father   . Hypertension Father   . Cancer Father     leukemia  . Colon polyps Father   . Depression Sister   . Anxiety disorder Sister   . Heart disease Maternal Grandfather   . Colon polyps Brother     History   Social History  . Marital Status: Legally Separated    Spouse Name: N/A    Number of Children: N/A  . Years of Education: N/A   Occupational History  . Not on file.   Social History Main Topics  . Smoking  status: Current Every Day Smoker -- 1.00 packs/day for 15 years    Types: Cigarettes  . Smokeless tobacco: Never Used  . Alcohol Use: No  . Drug Use: No  . Sexual Activity: Not Currently   Other Topics Concern  . Not on file   Social History Narrative  . No narrative on file    Current Outpatient Prescriptions on File Prior to Visit  Medication Sig Dispense Refill  . ergocalciferol (VITAMIN D2) 50000 UNITS capsule Take 50,000 Units by mouth once a week.      . folic acid (FOLVITE) 1 MG tablet Take 2 tablets (2 mg total) by mouth daily.  180 tablet  1  . gabapentin (NEURONTIN) 100 MG capsule TAKE 1 TO 2 CAPSULES 3 TO 4 TIMES A DAY AS NEEDED FOR ANXIETY  360 capsule  1  . minocycline (MINOCIN,DYNACIN) 100 MG capsule Take 100 mg by mouth daily.      . Vilazodone HCl (VIIBRYD) 40 MG TABS Take 1 tablet (40 mg total) by mouth daily.  30 tablet  5   No current facility-administered medications on file prior to visit.    Allergies  Allergen Reactions  . Neosporin [Neomycin-Bacitracin Zn-Polymyx]     Inflamed, itchy   . Penicillins   . Codeine  GI upset    Review of Systems  Review of Systems  Constitutional: Negative for fever and malaise/fatigue.  HENT: Negative for congestion.   Eyes: Negative for discharge.  Respiratory: Negative for shortness of breath.   Cardiovascular: Negative for chest pain, palpitations and leg swelling.  Gastrointestinal: Negative for nausea, abdominal pain and diarrhea.  Genitourinary: Negative for dysuria.  Musculoskeletal: Positive for joint pain. Negative for falls.  Skin: Negative for rash.  Neurological: Negative for loss of consciousness and headaches.  Endo/Heme/Allergies: Negative for polydipsia.  Psychiatric/Behavioral: Negative for depression and suicidal ideas. The patient is nervous/anxious. The patient does not have insomnia.     Objective  BP 100/70  Pulse 98  Temp(Src) 98.3 F (36.8 C) (Oral)  Ht 5\' 1"  (1.549 m)  Wt 112  lb (50.803 kg)  BMI 21.17 kg/m2  SpO2 98%  LMP 11/16/2008  Physical Exam  Physical Exam  Constitutional: She is oriented to person, place, and time and well-developed, well-nourished, and in no distress. No distress.  HENT:  Head: Normocephalic and atraumatic.  Eyes: Conjunctivae and EOM are normal. Pupils are equal, round, and reactive to light. Right eye exhibits no discharge. Left eye exhibits no discharge.  Neck: Normal range of motion. Neck supple. No thyromegaly present.  Cardiovascular: Normal rate, regular rhythm and normal heart sounds.   No murmur heard. Pulmonary/Chest: Effort normal and breath sounds normal. She has no wheezes.  Abdominal: Soft. Bowel sounds are normal. She exhibits no distension and no mass.  Musculoskeletal: Normal range of motion. She exhibits no edema and no tenderness.  Lymphadenopathy:    She has no cervical adenopathy.  Neurological: She is alert and oriented to person, place, and time.  Skin: Skin is warm and dry. No rash noted. She is not diaphoretic.  Psychiatric: Memory, affect and judgment normal.    Lab Results  Component Value Date   TSH 6.887* 01/24/2013   Lab Results  Component Value Date   WBC 7.4 01/24/2013   HGB 13.8 01/24/2013   HCT 38.3 01/24/2013   MCV 86.7 01/24/2013   PLT 333 01/24/2013   Lab Results  Component Value Date   CREATININE 0.96 01/24/2013   BUN 10 01/24/2013   NA 141 01/24/2013   K 4.1 01/24/2013   CL 104 01/24/2013   CO2 27 01/24/2013   Lab Results  Component Value Date   ALT 13 01/24/2013   AST 19 01/24/2013   ALKPHOS 79 01/24/2013   BILITOT 0.4 01/24/2013   Lab Results  Component Value Date   CHOL 158 11/20/2011   Lab Results  Component Value Date   HDL 55.50 11/20/2011   Lab Results  Component Value Date   LDLCALC 92 11/20/2011   Lab Results  Component Value Date   TRIG 55.0 11/20/2011   Lab Results  Component Value Date   CHOLHDL 3 11/20/2011     Assessment & Plan  Smoker Unfortunately  continues to smoke encouraged complete cessation.   Depression with anxiety Doing better on the Viibryd will stay on this for now.   Neck pain Encouraged moist heat, gentle stretching, salon pas, muscle relaxers and return if no improvement.   Vitamin D deficiency Recheck at next visit.   Preventative health care Encouraged heawrt healthy diet, given flu shot. Encouraged smoking cessation. Referred to consideration of colonoscopy, encouraged 3 D MGM with next   Other and unspecified hyperlipidemia Triglycerides up, avoid trans fats and simple carbs, add a krill oil cap and monitor

## 2013-05-08 NOTE — Patient Instructions (Addendum)
Salon Pas cream and patches call if neck/shoulder pain worsens for referral   Colon Polyps Polyps are lumps of extra tissue growing inside the body. Polyps can grow in the large intestine (colon). Most colon polyps are noncancerous (benign). However, some colon polyps can become cancerous over time. Polyps that are larger than a pea may be harmful. To be safe, caregivers remove and test all polyps. CAUSES  Polyps form when mutations in the genes cause your cells to grow and divide even though no more tissue is needed. RISK FACTORS There are a number of risk factors that can increase your chances of getting colon polyps. They include:  Being older than 50 years.  Family history of colon polyps or colon cancer.  Long-term colon diseases, such as colitis or Crohn disease.  Being overweight.  Smoking.  Being inactive.  Drinking too much alcohol. SYMPTOMS  Most small polyps do not cause symptoms. If symptoms are present, they may include:  Blood in the stool. The stool may look dark red or black.  Constipation or diarrhea that lasts longer than 1 week. DIAGNOSIS People often do not know they have polyps until their caregiver finds them during a regular checkup. Your caregiver can use 4 tests to check for polyps:  Digital rectal exam. The caregiver wears gloves and feels inside the rectum. This test would find polyps only in the rectum.  Barium enema. The caregiver puts a liquid called barium into your rectum before taking X-rays of your colon. Barium makes your colon look white. Polyps are dark, so they are easy to see in the X-ray pictures.  Sigmoidoscopy. A thin, flexible tube (sigmoidoscope) is placed into your rectum. The sigmoidoscope has a light and tiny camera in it. The caregiver uses the sigmoidoscope to look at the last third of your colon.  Colonoscopy. This test is like sigmoidoscopy, but the caregiver looks at the entire colon. This is the most common method for finding  and removing polyps. TREATMENT  Any polyps will be removed during a sigmoidoscopy or colonoscopy. The polyps are then tested for cancer. PREVENTION  To help lower your risk of getting more colon polyps:  Eat plenty of fruits and vegetables. Avoid eating fatty foods.  Do not smoke.  Avoid drinking alcohol.  Exercise every day.  Lose weight if recommended by your caregiver.  Eat plenty of calcium and folate. Foods that are rich in calcium include milk, cheese, and broccoli. Foods that are rich in folate include chickpeas, kidney beans, and spinach. HOME CARE INSTRUCTIONS Keep all follow-up appointments as directed by your caregiver. You may need periodic exams to check for polyps. SEEK MEDICAL CARE IF: You notice bleeding during a bowel movement. Document Released: 04/15/2004 Document Revised: 10/12/2011 Document Reviewed: 09/29/2011 [facility] Patient Information 2014 [place], [place].

## 2013-05-13 ENCOUNTER — Encounter: Payer: Self-pay | Admitting: Family Medicine

## 2013-05-13 DIAGNOSIS — M542 Cervicalgia: Secondary | ICD-10-CM

## 2013-05-13 DIAGNOSIS — E785 Hyperlipidemia, unspecified: Secondary | ICD-10-CM

## 2013-05-13 HISTORY — DX: Hyperlipidemia, unspecified: E78.5

## 2013-05-13 HISTORY — DX: Cervicalgia: M54.2

## 2013-05-13 NOTE — Assessment & Plan Note (Signed)
-   Recheck at next visit

## 2013-05-13 NOTE — Assessment & Plan Note (Signed)
Unfortunately continues to smoke encouraged complete cessation.

## 2013-05-13 NOTE — Assessment & Plan Note (Signed)
Triglycerides up, avoid trans fats and simple carbs, add a krill oil cap and monitor

## 2013-05-13 NOTE — Assessment & Plan Note (Signed)
Doing better on the Viibryd will stay on this for now.

## 2013-05-13 NOTE — Assessment & Plan Note (Signed)
Encouraged heawrt healthy diet, given flu shot. Encouraged smoking cessation. Referred to consideration of colonoscopy, encouraged 3 D MGM with next

## 2013-05-13 NOTE — Assessment & Plan Note (Signed)
Encouraged moist heat, gentle stretching, salon pas, muscle relaxers and return if no improvement.

## 2013-05-15 ENCOUNTER — Other Ambulatory Visit: Payer: Self-pay | Admitting: Family Medicine

## 2013-05-15 ENCOUNTER — Encounter: Payer: Self-pay | Admitting: Internal Medicine

## 2013-05-15 ENCOUNTER — Telehealth: Payer: Self-pay

## 2013-05-15 DIAGNOSIS — R634 Abnormal weight loss: Secondary | ICD-10-CM

## 2013-05-15 NOTE — Telephone Encounter (Signed)
Message copied by Court Joy on Mon May 15, 2013  3:58 PM ------      Message from: Autumn Myers      Created: Sat May 13, 2013 10:55 AM       Please make sure she knows her TR are up and theat she needs to minimize simple carbs and add Myers krill oil caps daily ------

## 2013-05-15 NOTE — Telephone Encounter (Signed)
Patient informed and voiced understanding.   Pt requested a lab result copy mailed to her.  Results put in the mail

## 2013-05-15 NOTE — Telephone Encounter (Signed)
Patient states that she would like to be set up with a nutrionalist? Patient states she just started gaining weight and if she stops eating she is going to be back to 100 pounds.  Please advise?

## 2013-05-23 ENCOUNTER — Other Ambulatory Visit: Payer: Self-pay

## 2013-05-23 DIAGNOSIS — Z1231 Encounter for screening mammogram for malignant neoplasm of breast: Secondary | ICD-10-CM

## 2013-06-19 ENCOUNTER — Ambulatory Visit (AMBULATORY_SURGERY_CENTER): Payer: Managed Care, Other (non HMO)

## 2013-06-19 VITALS — Ht 61.0 in | Wt 108.0 lb

## 2013-06-19 DIAGNOSIS — Z8371 Family history of colonic polyps: Secondary | ICD-10-CM

## 2013-06-19 DIAGNOSIS — Z83719 Family history of colon polyps, unspecified: Secondary | ICD-10-CM

## 2013-06-19 MED ORDER — SUPREP BOWEL PREP KIT 17.5-3.13-1.6 GM/177ML PO SOLN: 1.0000 | Freq: Once | ORAL | Status: DC

## 2013-06-20 ENCOUNTER — Encounter: Payer: Self-pay | Admitting: Internal Medicine

## 2013-06-21 ENCOUNTER — Ambulatory Visit: Payer: BC Managed Care – PPO | Admitting: Dietician

## 2013-06-23 ENCOUNTER — Ambulatory Visit (AMBULATORY_SURGERY_CENTER): Payer: BC Managed Care – PPO | Admitting: Internal Medicine

## 2013-06-23 ENCOUNTER — Encounter: Payer: Self-pay | Admitting: Internal Medicine

## 2013-06-23 VITALS — BP 122/70 | HR 56 | Temp 98.0°F | Resp 20 | Ht 61.0 in | Wt 108.0 lb

## 2013-06-23 DIAGNOSIS — Z860100 Personal history of colon polyps, unspecified: Secondary | ICD-10-CM | POA: Insufficient documentation

## 2013-06-23 DIAGNOSIS — D126 Benign neoplasm of colon, unspecified: Secondary | ICD-10-CM

## 2013-06-23 DIAGNOSIS — Z1211 Encounter for screening for malignant neoplasm of colon: Secondary | ICD-10-CM

## 2013-06-23 HISTORY — DX: Personal history of colonic polyps: Z86.010

## 2013-06-23 HISTORY — DX: Personal history of colon polyps, unspecified: Z86.0100

## 2013-06-23 MED ORDER — SODIUM CHLORIDE 0.9 % IV SOLN: 500.0000 mL | INTRAVENOUS | Status: DC

## 2013-06-23 NOTE — Op Note (Signed)
[facility] [address] [place], [address]   COLONOSCOPY PROCEDURE REPORT  PATIENT: Autumn Myers, Autumn Myers  MR#: [id number] BIRTHDATE: [birth year] , 52  yrs. old GENDER: Female ENDOSCOPIST: Iva Boop, MD, [facility] REFERRED MV:HQION Abner Greenspan, M.D. PROCEDURE DATE:  06/23/2013 PROCEDURE:   Colonoscopy with biopsy and snare polypectomy First Screening Colonoscopy - Avg.  risk and is 50 yrs.  old or older Yes.  Prior Negative Screening - Now for repeat screening. N/A  History of Adenoma - Now for follow-up colonoscopy & has been > or = to 3 yrs.  N/A  Polyps Removed Today? Yes. ASA CLASS:   Class II INDICATIONS:average risk screening. MEDICATIONS: propofol (Diprivan) 300mg  IV, MAC sedation, administered by CRNA, and These medications were titrated to patient response per physician's verbal order  DESCRIPTION OF PROCEDURE:   After the risks benefits and alternatives of the procedure were thoroughly explained, informed consent was obtained.  A digital rectal exam revealed no abnormalities of the rectum.   The LB [id number]  endoscope was introduced through the anus and advanced to the cecum, which was identified by both the appendix and ileocecal valve. No adverse events experienced.   The quality of the prep was excellent using Suprep  The instrument was then slowly withdrawn as the colon was fully examined.   COLON FINDINGS: Two diminutive sessile polyps were found at the splenic flexure and in the sigmoid colon.  A polypectomy was performed with a cold snare (splenic flexure)  and with cold forceps (sigmoid).  The resection was complete and the polyp tissue was completely retrieved.   The colon mucosa was otherwise normal. A right colon retroflexion was performed.  Retroflexed views revealed no abnormalities. The time to cecum=1 minutes 40 seconds. Withdrawal time=11 minutes 31 seconds.  The scope was withdrawn and the procedure completed. COMPLICATIONS: There  were no complications.  ENDOSCOPIC IMPRESSION: 1.   Two diminutive sessile polyps were found at the splenic flexure and in the sigmoid colon; polypectomy was performed with a cold snare and with cold forceps 2.   The colon mucosa was otherwise normal - ecellent prep  RECOMMENDATIONS: Timing of repeat colonoscopy will be determined by pathology findings.   eSigned:  Iva Boop, MD, [facility] 06/23/2013 2:53 PM   cc: Reuel Derby, MD and The Patient

## 2013-06-23 NOTE — Patient Instructions (Addendum)
I found and removed 2 polyps - tiny and benign. Not to worry. Everything else was ok.  I will let you know pathology results and when to have another routine colonoscopy by mail.  I appreciate the opportunity to care for you. Iva Boop, MD, [facility]   Discharge instructions given with verbal understanding. Handout on polyps. Resume previous medications. YOU HAD AN ENDOSCOPIC PROCEDURE TODAY AT THE [facility]: Refer to the procedure report that was given to you for any specific questions about what was found during the examination.  If the procedure report does not answer your questions, please call your gastroenterologist to clarify.  If you requested that your care partner not be given the details of your procedure findings, then the procedure report has been included in a sealed envelope for you to review at your convenience later.  YOU SHOULD EXPECT: Some feelings of bloating in the abdomen. Passage of more gas than usual.  Walking can help get rid of the air that was put into your GI tract during the procedure and reduce the bloating. If you had a lower endoscopy (such as a colonoscopy or flexible sigmoidoscopy) you may notice spotting of blood in your stool or on the toilet paper. If you underwent a bowel prep for your procedure, then you may not have a normal bowel movement for a few days.  DIET: Your first meal following the procedure should be a light meal and then it is ok to progress to your normal diet.  A half-sandwich or bowl of soup is an example of a good first meal.  Heavy or fried foods are harder to digest and may make you feel nauseous or bloated.  Likewise meals heavy in dairy and vegetables can cause extra gas to form and this can also increase the bloating.  Drink plenty of fluids but you should avoid alcoholic beverages for 24 hours.  ACTIVITY: Your care partner should take you home directly after the procedure.  You should plan to take it easy, moving slowly  for the rest of the day.  You can resume normal activity the day after the procedure however you should NOT DRIVE or use heavy machinery for 24 hours (because of the sedation medicines used during the test).    SYMPTOMS TO REPORT IMMEDIATELY: A gastroenterologist can be reached at any hour.  During normal business hours, 8:30 AM to 5:00 PM Monday through Friday, call [phone].  After hours and on weekends, please call the GI answering service at [phone] who will take a message and have the physician on call contact you.   Following lower endoscopy (colonoscopy or flexible sigmoidoscopy):  Excessive amounts of blood in the stool  Significant tenderness or worsening of abdominal pains  Swelling of the abdomen that is new, acute  Fever of 100F or higher  FOLLOW UP: If any biopsies were taken you will be contacted by phone or by letter within the next 1-3 weeks.  Call your gastroenterologist if you have not heard about the biopsies in 3 weeks.  Our staff will call the home number listed on your records the next business day following your procedure to check on you and address any questions or concerns that you may have at that time regarding the information given to you following your procedure. This is a courtesy call and so if there is no answer at the home number and we have not heard from you through the emergency physician on call, we will  assume that you have returned to your regular daily activities without incident.  SIGNATURES/CONFIDENTIALITY: You and/or your care partner have signed paperwork which will be entered into your electronic medical record.  These signatures attest to the fact that that the information above on your After Visit Summary has been reviewed and is understood.  Full responsibility of the confidentiality of this discharge information lies with you and/or your care-partner.

## 2013-06-23 NOTE — Progress Notes (Signed)
Called to room to assist during endoscopic procedure.  Patient ID and intended procedure confirmed with present staff. Received instructions for my participation in the procedure from the performing physician.  

## 2013-06-23 NOTE — Progress Notes (Signed)
Patient did not experience any of the following events: a burn prior to discharge; a fall within the facility; wrong site/side/patient/procedure/implant event; or a hospital transfer or hospital admission upon discharge from the facility. (G8907) Patient did not have preoperative order for IV antibiotic SSI prophylaxis. ([id number])  

## 2013-06-26 ENCOUNTER — Telehealth: Payer: Self-pay | Admitting: *Deleted

## 2013-06-26 NOTE — Telephone Encounter (Signed)
  Follow up Call-  Call back number 06/23/2013  Post procedure Call Back phone  # [phone]  Permission to leave phone message Yes     No answer,left message.

## 2013-06-28 ENCOUNTER — Encounter: Payer: Self-pay | Admitting: Internal Medicine

## 2013-06-28 NOTE — Progress Notes (Signed)
Quick Note:  Diminutive adenoma and benign mucosal polyp ______

## 2013-06-28 NOTE — Progress Notes (Signed)
Quick Note:  Repeat colon 2019 ______

## 2013-07-06 ENCOUNTER — Encounter: Payer: Self-pay | Admitting: Nurse Practitioner

## 2013-07-07 ENCOUNTER — Telehealth: Payer: Self-pay | Admitting: Nurse Practitioner

## 2013-07-07 ENCOUNTER — Encounter: Payer: Self-pay | Admitting: Nurse Practitioner

## 2013-07-07 ENCOUNTER — Ambulatory Visit: Payer: Self-pay | Admitting: Nurse Practitioner

## 2013-07-07 ENCOUNTER — Ambulatory Visit (INDEPENDENT_AMBULATORY_CARE_PROVIDER_SITE_OTHER): Payer: BC Managed Care – PPO | Admitting: Certified Nurse Midwife

## 2013-07-07 ENCOUNTER — Ambulatory Visit
Admission: RE | Admit: 2013-07-07 | Discharge: 2013-07-07 | Disposition: A | Discharge status: Home / Self Care | Payer: Managed Care, Other (non HMO) | Source: Ambulatory Visit

## 2013-07-07 ENCOUNTER — Encounter: Payer: Self-pay | Admitting: Certified Nurse Midwife

## 2013-07-07 VITALS — BP 104/64 | HR 64 | Resp 16 | Ht 61.75 in | Wt 109.0 lb

## 2013-07-07 DIAGNOSIS — Z1231 Encounter for screening mammogram for malignant neoplasm of breast: Secondary | ICD-10-CM

## 2013-07-07 DIAGNOSIS — E559 Vitamin D deficiency, unspecified: Secondary | ICD-10-CM

## 2013-07-07 DIAGNOSIS — Z01419 Encounter for gynecological examination (general) (routine) without abnormal findings: Secondary | ICD-10-CM

## 2013-07-07 DIAGNOSIS — Z0000 Encounter for general adult medical examination without abnormal findings: Secondary | ICD-10-CM

## 2013-07-07 NOTE — Telephone Encounter (Signed)
Work in appt this am on Debbi's schedule. She will jump in shower and come on in. Routing to provider for final review. Patient agreeable to disposition. Will close encounter

## 2013-07-07 NOTE — Telephone Encounter (Signed)
Patient was on Patty Grubb's schedule today but was cancelled as her provider is sick. Patient calling in to see if she can see anyone else today as she took the day off of work. She has a mammogram today at 12:30.

## 2013-07-07 NOTE — Progress Notes (Signed)
52 y.o. [id number] Legally Separated Caucasian Fe here for annual exam.  Menopausal no HRT Hot flashes and night sweats have decreased. No insomnia issues. Denies vaginal bleeding or dryness.Sees PCP for aex, labs and medication management. Recent colonoscopy with polyp present and needs repeat 5 years. No health issues today.  Patient's last menstrual period was 11/06/2008.          Sexually active: no  The current method of family planning is none.    Exercising: yes  a lot at work Smoker:  yes  Health Maintenance: Pap:  07-05-12 neg MMG:  2013 Colonoscopy:  06/25/13 polyp benign repeat 5 years BMD:   none TDaP:  Done by pcp  Labs: Poct urine-neg Self breast exam: done occ   reports that she has been smoking Cigarettes.  She has been smoking about 0.00 packs per day for the past 15 years. She has never used smokeless tobacco. She reports that she does not drink alcohol or use illicit drugs.  Past Medical History  Diagnosis Date  . Allergy   . Anxiety   . Depression   . Chicken pox as a child  . Vitamin D deficiency   . Smoker   . Depression with anxiety   . Preventative health care 10/13/2011  . Allergic state 11/20/2011  . Headache(784.0) 11/20/2011  . Myalgia 11/20/2011  . Sinusitis acute 01/29/2012  . Hematuria 01/29/2012  . Renal lithiasis 01/29/2012  . Hip pain, right 01/29/2012  . Puncture wound of thigh, right 02/03/2012  . Wound infection 06/20/2012  . Abnormal results of thyroid function studies 01/28/2013  . Neck pain 05/13/2013  . Other and unspecified hyperlipidemia 05/13/2013  . Sinusitis, acute 01/29/2012  . Personal history of colonic adenoma 06/23/2013    06/23/2013 2 dminutive polyps    . Cancer     basal cell    Past Surgical History  Procedure Laterality Date  . Carpal tunnel release      on right and left  . Cesarean section  1994  . Lithotripsy    . Colon polyps      1 was precancerous    Current Outpatient Prescriptions  Medication Sig Dispense  Refill  . ALPRAZolam (XANAX) 0.25 MG tablet Take 1 tablet (0.25 mg total) by mouth 2 (two) times daily as needed for anxiety.  10 tablet  0  . cyclobenzaprine (FLEXERIL) 10 MG tablet Take 1 tablet (10 mg total) by mouth at bedtime as needed for muscle spasms.  30 tablet  0  . folic acid (FOLVITE) 1 MG tablet Take 2 tablets (2 mg total) by mouth daily.  180 tablet  1  . gabapentin (NEURONTIN) 100 MG capsule TAKE 1 TO 2 CAPSULES 3 TO 4 TIMES A DAY AS NEEDED FOR ANXIETY  360 capsule  1  . loratadine (CLARITIN) 10 MG tablet Take 10 mg by mouth daily.      . methocarbamol (ROBAXIN) 500 MG tablet Take 1 tablet (500 mg total) by mouth 2 (two) times daily as needed. During the day  60 tablet  2  . Vilazodone HCl (VIIBRYD) 40 MG TABS Take 1 tablet (40 mg total) by mouth daily.  30 tablet  5   No current facility-administered medications for this visit.    Family History  Problem Relation Age of Onset  . Hypertension Mother   . Stroke Father   . Hypertension Father   . Cancer Father     leukemia  . Colon polyps Father   . Depression  Sister   . Anxiety disorder Sister   . Colon polyps Sister   . Heart disease Maternal Grandfather   . Colon polyps Brother   . Colon cancer Neg Hx   . Stomach cancer Neg Hx     ROS:  Pertinent items are noted in HPI.  Otherwise, a comprehensive ROS was negative.  Exam:   BP 104/64  Pulse 64  Resp 16  Ht 5' 1.75" (1.568 m)  Wt 109 lb (49.442 kg)  BMI 20.11 kg/m2  LMP 11/06/2008 Height: 5' 1.75" (156.8 cm)  Ht Readings from Last 3 Encounters:  07/07/13 5' 1.75" (1.568 m)  06/23/13 5\' 1"  (1.549 m)  06/19/13 5\' 1"  (1.549 m)    General appearance: alert, cooperative and appears stated age Head: Normocephalic, without obvious abnormality, atraumatic Neck: no adenopathy, supple, symmetrical, trachea midline and thyroid normal to inspection and palpation and non-palpable Lungs: clear to auscultation bilaterally Breasts: normal appearance, no masses or  tenderness, No nipple retraction or dimpling, No nipple discharge or bleeding, No axillary or supraclavicular adenopathy Heart: regular rate and rhythm Abdomen: soft, non-tender; no masses,  no organomegaly Extremities: extremities normal, atraumatic, no cyanosis or edema Skin: Skin color, texture, turgor normal. No rashes or lesions Lymph nodes: Cervical, supraclavicular, and axillary nodes normal. No abnormal inguinal nodes palpated Neurologic: Grossly normal   Pelvic: External genitalia:  no lesions              Urethra:  normal appearing urethra with no masses, tenderness or lesions              Bartholin's and Skene's: normal                 Vagina: normal appearing vagina with normal color and discharge, no lesions              Cervix: non tender, normal              Pap taken: yes Bimanual Exam:  Uterus:  normal size, contour, position, consistency, mobility, non-tender and anteverted              Adnexa: normal adnexa and no mass, fullness, tenderness               Rectovaginal: Confirms               Anus:  Deferred due to recent colonscopy  A:  Well Woman with normal exam  Menopausal no HRT  Smoker planning cessation with vapor cigarette use   History of Vitamin D deficiency  P:   Reviewed health and wellness pertinent to exam  Aware of need to evaluate if vaginal bleeding occurs  Encouraged to continue with plan offered support if needed, declines  Lab Vitamin D  Pap smear as per guidelines   Mammogram yearly pap smear taken today with HPVHR  counseled on breast self exam, mammography screening, adequate intake of calcium and vitamin D, diet and exercise  return annually or prn  An After Visit Summary was printed and given to the patient.

## 2013-07-07 NOTE — Patient Instructions (Signed)

## 2013-07-10 ENCOUNTER — Ambulatory Visit: Payer: BC Managed Care – PPO | Admitting: Dietician

## 2013-07-10 LAB — POCT URINALYSIS DIPSTICK
Blood, UA: NEGATIVE
Ketones, UA: NEGATIVE
Protein, UA: NEGATIVE

## 2013-07-10 NOTE — Progress Notes (Signed)
Note reviewed, agree with plan.  Quyen Cutsforth, MD  

## 2013-07-11 ENCOUNTER — Other Ambulatory Visit: Payer: Self-pay | Admitting: Family Medicine

## 2013-07-12 NOTE — Telephone Encounter (Signed)
Refill request for Viibyrd Last filled by MD on - 01/24/13 #30 x5 Last Appt: 05/08/13 Next Appt: 08/01/13 Please advise refill?

## 2013-07-20 ENCOUNTER — Ambulatory Visit: Payer: Self-pay | Admitting: Nurse Practitioner

## 2013-08-01 ENCOUNTER — Encounter: Payer: Self-pay | Admitting: Family Medicine

## 2013-08-01 ENCOUNTER — Ambulatory Visit (INDEPENDENT_AMBULATORY_CARE_PROVIDER_SITE_OTHER): Payer: BC Managed Care – PPO | Admitting: Family Medicine

## 2013-08-01 VITALS — BP 100/74 | HR 64 | Temp 97.8°F | Ht 61.0 in | Wt 111.1 lb

## 2013-08-01 DIAGNOSIS — F418 Other specified anxiety disorders: Secondary | ICD-10-CM

## 2013-08-01 DIAGNOSIS — F17200 Nicotine dependence, unspecified, uncomplicated: Secondary | ICD-10-CM

## 2013-08-01 DIAGNOSIS — E559 Vitamin D deficiency, unspecified: Secondary | ICD-10-CM

## 2013-08-01 DIAGNOSIS — E785 Hyperlipidemia, unspecified: Secondary | ICD-10-CM

## 2013-08-01 DIAGNOSIS — F341 Dysthymic disorder: Secondary | ICD-10-CM

## 2013-08-01 MED ORDER — ESCITALOPRAM OXALATE 10 MG PO TABS: 10.0000 mg | ORAL_TABLET | Freq: Every day | ORAL | Status: DC

## 2013-08-01 NOTE — Progress Notes (Signed)
Pre visit review using our clinic review tool, if applicable. No additional management support is needed unless otherwise documented below in the visit note. 

## 2013-08-01 NOTE — Patient Instructions (Signed)

## 2013-08-07 NOTE — Assessment & Plan Note (Signed)
Avoid trans fats, increase exercise, add krill oil caps

## 2013-08-07 NOTE — Assessment & Plan Note (Signed)
Encouraged complete cessation, counseled for greater than 3 minutes on strategies

## 2013-08-07 NOTE — Assessment & Plan Note (Signed)
Start Lexapro 10 mg po daily, Alprazolam prn

## 2013-08-07 NOTE — Assessment & Plan Note (Signed)
Vit d 22 encouraged supplements.

## 2013-08-07 NOTE — Progress Notes (Signed)
Patient ID: Autumn Myers, female   DOB: [birth year], 53 y.o.   MRN: [id number] Autumn Myers [id number] 02/23/1961 08/07/2013      Progress Note-Follow Up  Subjective  Chief Complaint  Chief Complaint  Patient presents with  . Follow-up    3 month    HPI  And is struggling with a great deal of stress at the moment and believe she needs to try medications to help her manage her anxiety and depression. No recent illness although unfortunately she continues to smoke. No headache, fevers, chest pain, palpitations or shortness of breath. No GI or GU concerns.  Past Medical History  Diagnosis Date  . Allergy   . Anxiety   . Depression   . Chicken pox as a child  . Vitamin D deficiency   . Smoker   . Depression with anxiety   . Preventative health care 10/13/2011  . Allergic state 11/20/2011  . Headache(784.0) 11/20/2011  . Myalgia 11/20/2011  . Sinusitis acute 01/29/2012  . Hematuria 01/29/2012  . Renal lithiasis 01/29/2012  . Hip pain, right 01/29/2012  . Puncture wound of thigh, right 02/03/2012  . Wound infection 06/20/2012  . Abnormal results of thyroid function studies 01/28/2013  . Neck pain 05/13/2013  . Other and unspecified hyperlipidemia 05/13/2013  . Sinusitis, acute 01/29/2012  . Personal history of colonic adenoma 06/23/2013    06/23/2013 2 dminutive polyps    . Cancer     basal cell    Past Surgical History  Procedure Laterality Date  . Carpal tunnel release      on right and left  . Cesarean section  1994  . Lithotripsy    . Colon polyps      1 was precancerous    Family History  Problem Relation Age of Onset  . Hypertension Mother   . Stroke Father   . Hypertension Father   . Cancer Father     leukemia  . Colon polyps Father   . Depression Sister   . Anxiety disorder Sister   . Colon polyps Sister   . Heart disease Maternal Grandfather   . Colon polyps Brother   . Colon cancer Neg Hx   . Stomach cancer Neg Hx     History   Social History  .  Marital Status: Legally Separated    Spouse Name: N/A    Number of Children: N/A  . Years of Education: N/A   Occupational History  . Not on file.   Social History Main Topics  . Smoking status: Current Every Day Smoker -- 15 years    Types: Cigarettes  . Smokeless tobacco: Never Used     Comment: less than a pack a day  . Alcohol Use: No  . Drug Use: No  . Sexual Activity: Not Currently    Partners: Male    Birth Control/ Protection: None   Other Topics Concern  . Not on file   Social History Narrative  . No narrative on file    Current Outpatient Prescriptions on File Prior to Visit  Medication Sig Dispense Refill  . ALPRAZolam (XANAX) 0.25 MG tablet Take 1 tablet (0.25 mg total) by mouth 2 (two) times daily as needed for anxiety.  10 tablet  0  . cyclobenzaprine (FLEXERIL) 10 MG tablet Take 1 tablet (10 mg total) by mouth at bedtime as needed for muscle spasms.  30 tablet  0  . folic acid (FOLVITE) 1 MG tablet Take 2 tablets (2 mg total)  by mouth daily.  180 tablet  1  . gabapentin (NEURONTIN) 100 MG capsule TAKE 1 TO 2 CAPSULES 3 TO 4 TIMES A DAY AS NEEDED FOR ANXIETY  360 capsule  1  . loratadine (CLARITIN) 10 MG tablet Take 10 mg by mouth daily.      . methocarbamol (ROBAXIN) 500 MG tablet Take 1 tablet (500 mg total) by mouth 2 (two) times daily as needed. During the day  60 tablet  2   No current facility-administered medications on file prior to visit.    Allergies  Allergen Reactions  . Eggs Or Egg-Derived Products Nausea And Vomiting    cramping  . Neosporin [Neomycin-Bacitracin Zn-Polymyx] Itching    Inflamed, itchy   . Penicillins Hives  . Codeine     GI upset    Review of Systems  Review of Systems  Constitutional: Negative for fever and malaise/fatigue.  HENT: Negative for congestion.   Eyes: Negative for discharge.  Respiratory: Negative for shortness of breath.   Cardiovascular: Negative for chest pain, palpitations and leg swelling.   Gastrointestinal: Negative for nausea, abdominal pain and diarrhea.  Genitourinary: Negative for dysuria.  Musculoskeletal: Negative for falls.  Skin: Negative for rash.  Neurological: Negative for loss of consciousness and headaches.  Endo/Heme/Allergies: Negative for polydipsia.  Psychiatric/Behavioral: Positive for depression. Negative for suicidal ideas. The patient is nervous/anxious. The patient does not have insomnia.     Objective  BP 100/74  Pulse 64  Temp(Src) 97.8 F (36.6 C) (Oral)  Ht 5\' 1"  (1.549 m)  Wt 111 lb 1.9 oz (50.404 kg)  BMI 21.01 kg/m2  SpO2 99%  LMP 11/06/2008  Physical Exam  Physical Exam  Constitutional: She is oriented to person, place, and time and well-developed, well-nourished, and in no distress. No distress.  HENT:  Head: Normocephalic and atraumatic.  Eyes: Conjunctivae are normal.  Neck: Neck supple. No thyromegaly present.  Cardiovascular: Normal rate, regular rhythm and normal heart sounds.   No murmur heard. Pulmonary/Chest: Effort normal and breath sounds normal. She has no wheezes.  Abdominal: She exhibits no distension and no mass.  Musculoskeletal: She exhibits no edema.  Lymphadenopathy:    She has no cervical adenopathy.  Neurological: She is alert and oriented to person, place, and time.  Skin: Skin is warm and dry. No rash noted. She is not diaphoretic.  Psychiatric: Memory, affect and judgment normal.    Lab Results  Component Value Date   TSH 2.025 05/08/2013   Lab Results  Component Value Date   WBC 7.5 05/08/2013   HGB 15.1* 05/08/2013   HCT 42.2 05/08/2013   MCV 88.5 05/08/2013   PLT 311 05/08/2013   Lab Results  Component Value Date   CREATININE 0.92 05/08/2013   BUN 6 05/08/2013   NA 141 05/08/2013   K 3.9 05/08/2013   CL 106 05/08/2013   CO2 32 05/08/2013   Lab Results  Component Value Date   ALT 13 05/08/2013   AST 17 05/08/2013   ALKPHOS 87 05/08/2013   BILITOT 0.3 05/08/2013   Lab Results  Component Value  Date   CHOL 197 05/08/2013   Lab Results  Component Value Date   HDL 59 05/08/2013   Lab Results  Component Value Date   LDLCALC 86 05/08/2013   Lab Results  Component Value Date   TRIG 258* 05/08/2013   Lab Results  Component Value Date   CHOLHDL 3.3 05/08/2013     Assessment & Plan  Smoker Encouraged  complete cessation, counseled for greater than 3 minutes on strategies  Other and unspecified hyperlipidemia Avoid trans fats, increase exercise, add krill oil caps  Vitamin D deficiency Vit d 22 encouraged supplements.  Depression with anxiety Start Lexapro 10 mg po daily, Alprazolam prn

## 2013-08-28 ENCOUNTER — Other Ambulatory Visit: Payer: Self-pay | Admitting: Family Medicine

## 2013-08-30 ENCOUNTER — Telehealth: Payer: Self-pay | Admitting: *Deleted

## 2013-08-30 NOTE — Telephone Encounter (Signed)
Notified pt. She states that she will not be able to follow up in 2 weeks due to upcoming dental procedure and costs. Advise pt I make provider aware.

## 2013-08-30 NOTE — Telephone Encounter (Signed)
Pt left message on voicemail that she thinks lexapro dose needs to be increased as she is having increased anger.  Please advise.

## 2013-08-30 NOTE — Telephone Encounter (Signed)
If we are going to use these meds we need at least one follow up. i cannot prescribe them without making sure she is doing OK it can be any time in next 4-5 weeks as long as she is OK

## 2013-08-30 NOTE — Telephone Encounter (Signed)
As long as she does not feel the med is making her worse, she can double her Lexapro to 20 mg daily but then she should come in in next 2 weeks to assess response.

## 2013-08-31 MED ORDER — ESCITALOPRAM OXALATE 20 MG PO TABS: 20.0000 mg | ORAL_TABLET | Freq: Every day | ORAL | Status: DC

## 2013-08-31 NOTE — Telephone Encounter (Signed)
RX sent for a 1 month supply.  Please call and inform pt what MD stated below and schedule appt.

## 2013-08-31 NOTE — Telephone Encounter (Signed)
Informed patient of medication refill and she scheduled appointment for 10/03/13

## 2013-09-27 ENCOUNTER — Telehealth: Payer: Self-pay | Admitting: Family Medicine

## 2013-09-27 MED ORDER — ESCITALOPRAM OXALATE 20 MG PO TABS: 20.0000 mg | ORAL_TABLET | Freq: Every day | ORAL | Status: DC

## 2013-09-27 NOTE — Telephone Encounter (Signed)
Refill-excitalopram

## 2013-10-03 ENCOUNTER — Telehealth: Payer: Self-pay | Admitting: Family Medicine

## 2013-10-03 ENCOUNTER — Encounter: Payer: Self-pay | Admitting: Family Medicine

## 2013-10-03 ENCOUNTER — Ambulatory Visit (INDEPENDENT_AMBULATORY_CARE_PROVIDER_SITE_OTHER): Payer: Managed Care, Other (non HMO) | Admitting: Family Medicine

## 2013-10-03 VITALS — BP 100/64 | HR 71 | Temp 98.2°F | Ht 61.0 in | Wt 110.1 lb

## 2013-10-03 DIAGNOSIS — E559 Vitamin D deficiency, unspecified: Secondary | ICD-10-CM

## 2013-10-03 DIAGNOSIS — F3289 Other specified depressive episodes: Secondary | ICD-10-CM

## 2013-10-03 DIAGNOSIS — F32A Depression, unspecified: Secondary | ICD-10-CM

## 2013-10-03 DIAGNOSIS — F418 Other specified anxiety disorders: Secondary | ICD-10-CM

## 2013-10-03 DIAGNOSIS — F329 Major depressive disorder, single episode, unspecified: Secondary | ICD-10-CM

## 2013-10-03 DIAGNOSIS — F341 Dysthymic disorder: Secondary | ICD-10-CM

## 2013-10-03 MED ORDER — ALPRAZOLAM 0.25 MG PO TABS: 0.2500 mg | ORAL_TABLET | Freq: Two times a day (BID) | ORAL | Status: DC | PRN

## 2013-10-03 MED ORDER — ESCITALOPRAM OXALATE 20 MG PO TABS: 20.0000 mg | ORAL_TABLET | Freq: Every day | ORAL | Status: DC

## 2013-10-03 NOTE — Telephone Encounter (Signed)
Relevant patient education mailed to patient.  

## 2013-10-03 NOTE — Progress Notes (Signed)
Pre visit review using our clinic review tool, if applicable. No additional management support is needed unless otherwise documented below in the visit note. 

## 2013-10-08 NOTE — Assessment & Plan Note (Signed)
Patient just increased her Lexapro last week. Has not had any difficulty but is not sure she is noting any improvement. Will continue this dose and is allowed Alprazolam to use prn. Return in 6 months or as needed

## 2013-10-08 NOTE — Progress Notes (Signed)
Patient ID: Autumn Myers, female   DOB: 07/03/1961, 53 y.o.   MRN: [id number] Autumn Myers [id number] 24-Jan-1961 10/08/2013      Progress Note-Follow Up  Subjective  Chief Complaint  Chief Complaint  Patient presents with  . Medication Refill    pt is here for f/u on new change on med.    HPI  Patient is a 53 year old Caucasian female who is very agitated today. She continues to struggle with depression, anxiety and insomnia but denies suicidal ideation. She is frustrated that she had to come in today to discuss medication changes. She was on multiple medical weeks when she decided she needed to increase. She became very agitated and required her to come in to follow this up. She denies any worsening agitation or side effects but is unsure if the Lexapro at 20 mg has been helpful. No GI or GU complaints. No headaches chest pain, palpitations or shortness of breath. No GI or GU concerns. Declines lab work for further evaluation due to lack of health insurance  Past Medical History  Diagnosis Date  . Allergy   . Anxiety   . Depression   . Chicken pox as a child  . Vitamin D deficiency   . Smoker   . Depression with anxiety   . Preventative health care 10/13/2011  . Allergic state 11/20/2011  . Headache(784.0) 11/20/2011  . Myalgia 11/20/2011  . Sinusitis acute 01/29/2012  . Hematuria 01/29/2012  . Renal lithiasis 01/29/2012  . Hip pain, right 01/29/2012  . Puncture wound of thigh, right 02/03/2012  . Wound infection 06/20/2012  . Abnormal results of thyroid function studies 01/28/2013  . Neck pain 05/13/2013  . Other and unspecified hyperlipidemia 05/13/2013  . Sinusitis, acute 01/29/2012  . Personal history of colonic adenoma 06/23/2013    06/23/2013 2 dminutive polyps    . Cancer     basal cell    Past Surgical History  Procedure Laterality Date  . Carpal tunnel release      on right and left  . Cesarean section  1994  . Lithotripsy    . Colon polyps      1 was precancerous     Family History  Problem Relation Age of Onset  . Hypertension Mother   . Stroke Father   . Hypertension Father   . Cancer Father     leukemia  . Colon polyps Father   . Depression Sister   . Anxiety disorder Sister   . Colon polyps Sister   . Heart disease Maternal Grandfather   . Colon polyps Brother   . Colon cancer Neg Hx   . Stomach cancer Neg Hx     History   Social History  . Marital Status: Legally Separated    Spouse Name: N/A    Number of Children: N/A  . Years of Education: N/A   Occupational History  . Not on file.   Social History Main Topics  . Smoking status: Current Every Day Smoker -- 15 years    Types: Cigarettes  . Smokeless tobacco: Never Used     Comment: less than a pack a day  . Alcohol Use: No  . Drug Use: No  . Sexual Activity: Not Currently    Partners: Male    Birth Control/ Protection: None   Other Topics Concern  . Not on file   Social History Narrative  . No narrative on file    Current Outpatient Prescriptions on File Prior to Visit  Medication Sig Dispense Refill  . cyclobenzaprine (FLEXERIL) 10 MG tablet Take 1 tablet (10 mg total) by mouth at bedtime as needed for muscle spasms.  30 tablet  0  . folic acid (FOLVITE) 1 MG tablet Take 2 tablets (2 mg total) by mouth daily.  180 tablet  1  . gabapentin (NEURONTIN) 100 MG capsule TAKE 1 TO 2 CAPSULES 3 TO 4 TIMES A DAY AS NEEDED FOR ANXIETY  360 capsule  1  . loratadine (CLARITIN) 10 MG tablet Take 10 mg by mouth daily.      . methocarbamol (ROBAXIN) 500 MG tablet Take 1 tablet (500 mg total) by mouth 2 (two) times daily as needed. During the day  60 tablet  2   No current facility-administered medications on file prior to visit.    Allergies  Allergen Reactions  . Eggs Or Egg-Derived Products Nausea And Vomiting    cramping  . Neosporin [Neomycin-Bacitracin Zn-Polymyx] Itching    Inflamed, itchy   . Penicillins Hives  . Codeine     GI upset    Review of  Systems  Review of Systems  Constitutional: Positive for malaise/fatigue. Negative for fever.  HENT: Negative for congestion.   Eyes: Negative for discharge.  Respiratory: Negative for shortness of breath.   Cardiovascular: Negative for chest pain, palpitations and leg swelling.  Gastrointestinal: Negative for nausea, abdominal pain and diarrhea.  Genitourinary: Negative for dysuria.  Musculoskeletal: Negative for falls.  Skin: Negative for rash.  Neurological: Negative for loss of consciousness and headaches.  Endo/Heme/Allergies: Negative for polydipsia.  Psychiatric/Behavioral: Positive for depression. Negative for suicidal ideas. The patient is nervous/anxious and has insomnia.     Objective  BP 100/64  Pulse 71  Temp(Src) 98.2 F (36.8 C) (Oral)  Ht 5\' 1"  (1.549 m)  Wt 110 lb 1.9 oz (49.95 kg)  BMI 20.82 kg/m2  SpO2 98%  LMP 11/06/2008  Physical Exam  Physical Exam  Constitutional: She is oriented to person, place, and time and well-developed, well-nourished, and in no distress. No distress.  HENT:  Head: Normocephalic and atraumatic.  Eyes: Conjunctivae are normal.  Neck: Neck supple. No thyromegaly present.  Cardiovascular: Normal rate, regular rhythm and normal heart sounds.   No murmur heard. Pulmonary/Chest: Effort normal and breath sounds normal. She has no wheezes.  Abdominal: She exhibits no distension and no mass.  Musculoskeletal: She exhibits no edema and no tenderness.  Lymphadenopathy:    She has no cervical adenopathy.  Neurological: She is alert and oriented to person, place, and time.  Skin: Skin is warm and dry. No rash noted. She is not diaphoretic.  Psychiatric: Memory and judgment normal.  agitated    Lab Results  Component Value Date   TSH 2.025 05/08/2013   Lab Results  Component Value Date   WBC 7.5 05/08/2013   HGB 15.1* 05/08/2013   HCT 42.2 05/08/2013   MCV 88.5 05/08/2013   PLT 311 05/08/2013   Lab Results  Component Value Date    CREATININE 0.92 05/08/2013   BUN 6 05/08/2013   NA 141 05/08/2013   K 3.9 05/08/2013   CL 106 05/08/2013   CO2 32 05/08/2013   Lab Results  Component Value Date   ALT 13 05/08/2013   AST 17 05/08/2013   ALKPHOS 87 05/08/2013   BILITOT 0.3 05/08/2013   Lab Results  Component Value Date   CHOL 197 05/08/2013   Lab Results  Component Value Date   HDL 59 05/08/2013  Lab Results  Component Value Date   LDLCALC 86 05/08/2013   Lab Results  Component Value Date   TRIG 258* 05/08/2013   Lab Results  Component Value Date   CHOLHDL 3.3 05/08/2013     Assessment & Plan  Depression with anxiety Patient just increased her Lexapro last week. Has not had any difficulty but is not sure she is noting any improvement. Will continue this dose and is allowed Alprazolam to use prn. Return in 6 months or as needed

## 2013-10-23 ENCOUNTER — Ambulatory Visit: Payer: BC Managed Care – PPO | Admitting: Family Medicine

## 2013-12-28 ENCOUNTER — Other Ambulatory Visit: Payer: Self-pay | Admitting: Family Medicine

## 2014-03-26 ENCOUNTER — Ambulatory Visit (INDEPENDENT_AMBULATORY_CARE_PROVIDER_SITE_OTHER): Payer: Managed Care, Other (non HMO) | Admitting: Nurse Practitioner

## 2014-03-26 ENCOUNTER — Encounter: Payer: Self-pay | Admitting: Nurse Practitioner

## 2014-03-26 ENCOUNTER — Telehealth: Payer: Self-pay | Admitting: Family Medicine

## 2014-03-26 VITALS — BP 108/74 | HR 70 | Temp 97.9°F | Resp 18 | Ht 61.0 in | Wt 109.0 lb

## 2014-03-26 DIAGNOSIS — R079 Chest pain, unspecified: Secondary | ICD-10-CM

## 2014-03-26 DIAGNOSIS — R109 Unspecified abdominal pain: Secondary | ICD-10-CM

## 2014-03-26 LAB — POCT URINALYSIS DIPSTICK
Bilirubin, UA: NEGATIVE
Blood, UA: NEGATIVE
GLUCOSE UA: NEGATIVE
KETONES UA: NEGATIVE
Leukocytes, UA: NEGATIVE
Nitrite, UA: NEGATIVE
Protein, UA: NEGATIVE
SPEC GRAV UA: 1.01
Urobilinogen, UA: 0.2
pH, UA: 5.5

## 2014-03-26 NOTE — Telephone Encounter (Signed)
Relevant patient education assigned to patient using Emmi. ° °

## 2014-03-26 NOTE — Progress Notes (Signed)
Pre visit review using our clinic review tool, if applicable. No additional management support is needed unless otherwise documented below in the visit note. 

## 2014-03-26 NOTE — Patient Instructions (Signed)
ECG looks OK. Pain may be related to indigestion or anxiety. If chest pain is persistent, please call before your scheduled appt. In Sept. You may need cardiology work up.  Side pain may be kidney stone. Renal ultrasound would be helpful to determine if this is cause for pain. Sip fluids every hour. Take 2 to 3 tabs ibuprophen with food twice daily for pain if needed. If pain becomes severe, please call. Alternatively, pain may be coming from back. Try muscle relaxer & gentle back stretches twice daily.   Nice to meet you.  Kidney Stones Kidney stones (urolithiasis) are deposits that form inside your kidneys. The intense pain is caused by the stone moving through the urinary tract. When the stone moves, the ureter goes into spasm around the stone. The stone is usually passed in the urine.  CAUSES   A disorder that makes certain neck glands produce too much parathyroid hormone (primary hyperparathyroidism).  A buildup of uric acid crystals, similar to gout in your joints.  Narrowing (stricture) of the ureter.  A kidney obstruction present at birth (congenital obstruction).  Previous surgery on the kidney or ureters.  Numerous kidney infections. SYMPTOMS   Feeling sick to your stomach (nauseous).  Throwing up (vomiting).  Blood in the urine (hematuria).  Pain that usually spreads (radiates) to the groin.  Frequency or urgency of urination. DIAGNOSIS   Taking a history and physical exam.  Blood or urine tests.  CT scan.  Occasionally, an examination of the inside of the urinary bladder (cystoscopy) is performed. TREATMENT   Observation.  Increasing your fluid intake.  Extracorporeal shock wave lithotripsy--This is a noninvasive procedure that uses shock waves to break up kidney stones.  Surgery may be needed if you have severe pain or persistent obstruction. There are various surgical procedures. Most of the procedures are performed with the use of small instruments.  Only small incisions are needed to accommodate these instruments, so recovery time is minimized. The size, location, and chemical composition are all important variables that will determine the proper choice of action for you. Talk to your health care provider to better understand your situation so that you will minimize the risk of injury to yourself and your kidney.  HOME CARE INSTRUCTIONS   Drink enough water and fluids to keep your urine clear or pale yellow. This will help you to pass the stone or stone fragments.  Strain all urine through the provided strainer. Keep all particulate matter and stones for your health care provider to see. The stone causing the pain may be as small as a grain of salt. It is very important to use the strainer each and every time you pass your urine. The collection of your stone will allow your health care provider to analyze it and verify that a stone has actually passed. The stone analysis will often identify what you can do to reduce the incidence of recurrences.  Only take over-the-counter or prescription medicines for pain, discomfort, or fever as directed by your health care provider.  Make a follow-up appointment with your health care provider as directed.  Get follow-up X-rays if required. The absence of pain does not always mean that the stone has passed. It may have only stopped moving. If the urine remains completely obstructed, it can cause loss of kidney function or even complete destruction of the kidney. It is your responsibility to make sure X-rays and follow-ups are completed. Ultrasounds of the kidney can show blockages and the status of  the kidney. Ultrasounds are not associated with any radiation and can be performed easily in a matter of minutes. SEEK MEDICAL CARE IF:  You experience pain that is progressive and unresponsive to any pain medicine you have been prescribed. SEEK IMMEDIATE MEDICAL CARE IF:   Pain cannot be controlled with the  prescribed medicine.  You have a fever or shaking chills.  The severity or intensity of pain increases over 18 hours and is not relieved by pain medicine.  You develop a new onset of abdominal pain.  You feel faint or pass out.  You are unable to urinate. MAKE SURE YOU:   Understand these instructions.  Will watch your condition.  Will get help right away if you are not doing well or get worse. Document Released: 07/20/2005 Document Revised: 03/22/2013 Document Reviewed: 12/21/2012 [facility] Patient Information 2015 [place], [place]. This information is not intended to replace advice given to you by your health care provider. Make sure you discuss any questions you have with your health care provider.   Chest Pain (Nonspecific) It is often hard to give a specific diagnosis for the cause of chest pain. There is always a chance that your pain could be related to something serious, such as a heart attack or a blood clot in the lungs. You need to follow up with your health care provider for further evaluation. CAUSES   Heartburn.  Pneumonia or bronchitis.  Anxiety or stress.  Inflammation around your heart (pericarditis) or lung (pleuritis or pleurisy).  A blood clot in the lung.  A collapsed lung (pneumothorax). It can develop suddenly on its own (spontaneous pneumothorax) or from trauma to the chest.  Shingles infection (herpes zoster virus). The chest wall is composed of bones, muscles, and cartilage. Any of these can be the source of the pain.  The bones can be bruised by injury.  The muscles or cartilage can be strained by coughing or overwork.  The cartilage can be affected by inflammation and become sore (costochondritis). DIAGNOSIS  Lab tests or other studies may be needed to find the cause of your pain. Your health care provider may have you take a test called an ambulatory electrocardiogram (ECG). An ECG records your heartbeat patterns over a 24-hour period. You may  also have other tests, such as:  Transthoracic echocardiogram (TTE). During echocardiography, sound waves are used to evaluate how blood flows through your heart.  Transesophageal echocardiogram (TEE).  Cardiac monitoring. This allows your health care provider to monitor your heart rate and rhythm in real time.  Holter monitor. This is a portable device that records your heartbeat and can help diagnose heart arrhythmias. It allows your health care provider to track your heart activity for several days, if needed.  Stress tests by exercise or by giving medicine that makes the heart beat faster. TREATMENT   Treatment depends on what may be causing your chest pain. Treatment may include:  Acid blockers for heartburn.  Anti-inflammatory medicine.  Pain medicine for inflammatory conditions.  Antibiotics if an infection is present.  You may be advised to change lifestyle habits. This includes stopping smoking and avoiding alcohol, caffeine, and chocolate.  You may be advised to keep your head raised (elevated) when sleeping. This reduces the chance of acid going backward from your stomach into your esophagus. Most of the time, nonspecific chest pain will improve within 2-3 days with rest and mild pain medicine.  HOME CARE INSTRUCTIONS   If antibiotics were prescribed, take them as directed. [employer]  them even if you start to feel better.  For the next few days, avoid physical activities that bring on chest pain. Continue physical activities as directed.  Do not use any tobacco products, including cigarettes, chewing tobacco, or electronic cigarettes.  Avoid drinking alcohol.  Only take medicine as directed by your health care provider.  Follow your health care provider's suggestions for further testing if your chest pain does not go away.  Keep any follow-up appointments you made. If you do not go to an appointment, you could develop lasting (chronic) problems with pain. If there is  any problem keeping an appointment, call to reschedule. SEEK MEDICAL CARE IF:   Your chest pain does not go away, even after treatment.  You have a rash with blisters on your chest.  You have a fever. SEEK IMMEDIATE MEDICAL CARE IF:   You have increased chest pain or pain that spreads to your arm, neck, jaw, back, or abdomen.  You have shortness of breath.  You have an increasing cough, or you cough up blood.  You have severe back or abdominal pain.  You feel nauseous or vomit.  You have severe weakness.  You faint.  You have chills. This is an emergency. Do not wait to see if the pain will go away. Get medical help at once. Call your local emergency services (911 in [place] Do not drive yourself to the hospital. MAKE SURE YOU:   Understand these instructions.  Will watch your condition.  Will get help right away if you are not doing well or get worse. Document Released: 04/29/2005 Document Revised: 07/25/2013 Document Reviewed: 02/23/2008 [facility] Patient Information 2015 [place], [place]. This information is not intended to replace advice given to you by your health care provider. Make sure you discuss any questions you have with your health care provider.

## 2014-03-27 LAB — URINE CULTURE
Colony Count: NO GROWTH
ORGANISM ID, BACTERIA: NO GROWTH

## 2014-03-28 ENCOUNTER — Telehealth: Payer: Self-pay | Admitting: Nurse Practitioner

## 2014-03-28 DIAGNOSIS — R079 Chest pain, unspecified: Secondary | ICD-10-CM | POA: Insufficient documentation

## 2014-03-28 DIAGNOSIS — R109 Unspecified abdominal pain: Secondary | ICD-10-CM | POA: Insufficient documentation

## 2014-03-28 NOTE — Progress Notes (Signed)
Subjective:     Autumn Myers is a 53 y.o. female who presents w/R flank pain & L chest pain. All pain started this am on wakening. She denies dysuria, nausea, abd pain, fever, indigestion, diarrhea, chills, SOB. She had a few "hot flashes" this am-she reports this as nml finding. Risk factor: smoker. She has done anything to try to relieve pain. She is active, nml weight. Does not recall activity or injury that would have caused pain.  The following portions of the patient's history were reviewed and updated as appropriate: allergies, current medications, past medical history, past social history, past surgical history and problem list.  Review of Systems Pertinent items are noted in HPI.    Objective:    BP 108/74  Pulse 70  Temp(Src) 97.9 F (36.6 C) (Oral)  Resp 18  Ht 5\' 1"  (1.549 m)  Wt 109 lb (49.442 kg)  BMI 20.61 kg/m2  SpO2 99%  LMP 11/06/2008 BP 108/74  Pulse 70  Temp(Src) 97.9 F (36.6 C) (Oral)  Resp 18  Ht 5\' 1"  (1.549 m)  Wt 109 lb (49.442 kg)  BMI 20.61 kg/m2  SpO2 99%  LMP 11/06/2008 General appearance: alert, cooperative, appears stated age and no distress Head: Normocephalic, without obvious abnormality, atraumatic Eyes: negative findings: lids and lashes normal and conjunctivae and sclerae normal Lungs: clear to auscultation bilaterally Heart: regular rate and rhythm, S1, S2 normal, no murmur, click, rub or gallop Abdomen: soft, non-tender; bowel sounds normal; no masses,  no organomegaly Extremities: extremities normal, atraumatic, no cyanosis or edema Pulses: 2+ and symmetric  Back: no CVA tenderness  Assessment:  1. Chest pain, unspecified - EKG 12-Lead  2. Right flank pain - POCT urinalysis dipstick - Urine culture  See problem list for complete A&P See pt instructions. F/u 2 wks or sooner if new symptoms or worse pain.

## 2014-03-28 NOTE — Assessment & Plan Note (Signed)
R sided flank pain started this am. No dysuria, fever, nausea, abd pain. No Hx kidney stones. POC urine-no blood. Offered renal US, pt declined. Ibuprophen, fluids. F/u 2 wks or sooner if pain worse or new symptoms.

## 2014-03-28 NOTE — Assessment & Plan Note (Signed)
C/o L sided intermittent, dull cp, started this am. ECG: NSR, short PR, no delta waves. DD: anxiety, GI, musculoskeletal F/u in 2 wks or sooner if pain persistent.

## 2014-03-29 NOTE — Telephone Encounter (Signed)
Spoke with pt, advised message from [place]. Pt states she is feeling better.

## 2014-03-29 NOTE — Telephone Encounter (Signed)
pls call pt: Advise No bacteria growth in urine. Ask if feeling better.

## 2014-03-29 NOTE — Telephone Encounter (Signed)
Left message for pt to call back  °

## 2014-04-10 ENCOUNTER — Ambulatory Visit (INDEPENDENT_AMBULATORY_CARE_PROVIDER_SITE_OTHER): Payer: Managed Care, Other (non HMO) | Admitting: Family Medicine

## 2014-04-10 ENCOUNTER — Encounter: Payer: Self-pay | Admitting: Family Medicine

## 2014-04-10 VITALS — BP 108/67 | HR 74 | Temp 98.4°F | Ht 61.0 in | Wt 111.4 lb

## 2014-04-10 DIAGNOSIS — F329 Major depressive disorder, single episode, unspecified: Secondary | ICD-10-CM

## 2014-04-10 DIAGNOSIS — F3289 Other specified depressive episodes: Secondary | ICD-10-CM

## 2014-04-10 DIAGNOSIS — F341 Dysthymic disorder: Secondary | ICD-10-CM

## 2014-04-10 DIAGNOSIS — F418 Other specified anxiety disorders: Secondary | ICD-10-CM

## 2014-04-10 DIAGNOSIS — F32A Depression, unspecified: Secondary | ICD-10-CM

## 2014-04-10 DIAGNOSIS — F17200 Nicotine dependence, unspecified, uncomplicated: Secondary | ICD-10-CM

## 2014-04-10 DIAGNOSIS — E785 Hyperlipidemia, unspecified: Secondary | ICD-10-CM

## 2014-04-10 DIAGNOSIS — I7300 Raynaud's syndrome without gangrene: Secondary | ICD-10-CM

## 2014-04-10 MED ORDER — VENLAFAXINE HCL ER 75 MG PO CP24: 75.0000 mg | ORAL_CAPSULE | Freq: Every day | ORAL | Status: DC

## 2014-04-10 MED ORDER — ESCITALOPRAM OXALATE 20 MG PO TABS: ORAL_TABLET | ORAL | Status: DC

## 2014-04-10 MED ORDER — VENLAFAXINE HCL ER 37.5 MG PO CP24: 37.5000 mg | ORAL_CAPSULE | Freq: Every day | ORAL | Status: DC

## 2014-04-10 NOTE — Progress Notes (Signed)
Pre visit review using our clinic review tool, if applicable. No additional management support is needed unless otherwise documented below in the visit note. 

## 2014-04-10 NOTE — Patient Instructions (Signed)
Raynaud's Syndrome Raynaud's Syndrome is a disorder of the blood vessels in your hands and feet. It occurs when small arteries of the arms/hands or legs/feet become sensitive to cold or emotional upset. This causes the arteries to constrict, or narrow, and reduces blood flow to the area. The color in the fingers or toes changes from white to bluish to red and this is not usually painful. There may be numbness and tingling. Sores on the skin (ulcers) can form. Symptoms are usually relieved by warming. HOME CARE INSTRUCTIONS   Avoid exposure to cold. Keep your whole body warm and dry. Dress in layers. Wear mittens or gloves when handling ice or frozen food and when outdoors. Use holders for glasses or cans containing cold drinks. If possible, stay indoors during cold weather.  Limit your use of caffeine. Switch to decaffeinated coffee, tea, and soda pop. Avoid chocolate.  Avoid smoking or being around cigarette smoke. Smoke will make symptoms worse.  Wear loose fitting socks and comfortable, roomy shoes.  Avoid vibrating tools and machinery.  If possible, avoid stressful and emotional situations. Exercise, meditation and yoga may help you cope with stress. Biofeedback may be useful.  Ask your caregiver about medicine (calcium channel blockers) that may control Raynaud's phenomena. SEEK MEDICAL CARE IF:   Your discomfort becomes worse, despite conservative treatment.  You develop sores on your fingers and toes that do not heal. Document Released: 07/17/2000 Document Revised: 10/12/2011 Document Reviewed: 07/24/2008 [facility] Patient Information 2015 [place], [place]. This information is not intended to replace advice given to you by your health care provider. Make sure you discuss any questions you have with your health care provider. Generalized Anxiety Disorder Generalized anxiety disorder (GAD) is a mental disorder. It interferes with life functions, including relationships, work, and  school. GAD is different from normal anxiety, which everyone experiences at some point in their lives in response to specific life events and activities. Normal anxiety actually helps [place] prepare for and get through these life events and activities. Normal anxiety goes away after the event or activity is over.  GAD causes anxiety that is not necessarily related to specific events or activities. It also causes excess anxiety in proportion to specific events or activities. The anxiety associated with GAD is also difficult to control. GAD can vary from mild to severe. People with severe GAD can have intense waves of anxiety with physical symptoms (panic attacks).  SYMPTOMS The anxiety and worry associated with GAD are difficult to control. This anxiety and worry are related to many life events and activities and also occur more days than not for 6 months or longer. People with GAD also have three or more of the following symptoms (one or more in children):  Restlessness.   Fatigue.  Difficulty concentrating.   Irritability.  Muscle tension.  Difficulty sleeping or unsatisfying sleep. DIAGNOSIS GAD is diagnosed through an assessment by your health care provider. Your health care provider will ask you questions aboutyour mood,physical symptoms, and events in your life. Your health care provider may ask you about your medical history and use of alcohol or drugs, including prescription medicines. Your health care provider may also do a physical exam and blood tests. Certain medical conditions and the use of certain substances can cause symptoms similar to those associated with GAD. Your health care provider may refer you to a mental health specialist for further evaluation. TREATMENT The following therapies are usually used to treat GAD:   Medication. Antidepressant medication usually is prescribed  for long-term daily control. Antianxiety medicines may be added in severe cases, especially when  panic attacks occur.   Talk therapy (psychotherapy). Certain types of talk therapy can be helpful in treating GAD by providing support, education, and guidance. A form of talk therapy called cognitive behavioral therapy can teach you healthy ways to think about and react to daily life events and activities.  Stress managementtechniques. These include yoga, meditation, and exercise and can be very helpful when they are practiced regularly. A mental health specialist can help determine which treatment is best for you. Some people see improvement with one therapy. However, other people require a combination of therapies. Document Released: 11/14/2012 Document Revised: 12/04/2013 Document Reviewed: 11/14/2012 [facility] Patient Information 2015 [place], [place]. This information is not intended to replace advice given to you by your health care provider. Make sure you discuss any questions you have with your health care provider.

## 2014-04-15 ENCOUNTER — Encounter: Payer: Self-pay | Admitting: Family Medicine

## 2014-04-15 DIAGNOSIS — F32A Depression, unspecified: Secondary | ICD-10-CM | POA: Insufficient documentation

## 2014-04-15 DIAGNOSIS — I7300 Raynaud's syndrome without gangrene: Secondary | ICD-10-CM | POA: Insufficient documentation

## 2014-04-15 DIAGNOSIS — F329 Major depressive disorder, single episode, unspecified: Secondary | ICD-10-CM | POA: Insufficient documentation

## 2014-04-15 HISTORY — DX: Raynaud's syndrome without gangrene: I73.00

## 2014-04-15 NOTE — Assessment & Plan Note (Signed)
Describes symptoms mostly in hands encouraged to stay warm

## 2014-04-15 NOTE — Progress Notes (Signed)
Patient ID: Autumn Myers, female   DOB: Feb 03, 1961, 53 y.o.   MRN: [id number] Autumn Myers [id number] 15-Feb-1961 04/15/2014      Progress Note-Follow Up  Subjective  Chief Complaint  Chief Complaint  Patient presents with  . Follow-up    3 month    HPI  Patient is a 53 year old female in today for routine medical care. She is recently been diagnosed with Raynaud's phenomenon and has very cold painful fingers at times. She is noting worsening stressors at work and increasing anxiety and depression. No suicidal ideation. She does not feel the "is helping adequately any longer. Denies CP/palp/SOB/HA/congestion/fevers/GI or GU c/o. Taking meds as prescribed  Past Medical History  Diagnosis Date  . Allergy   . Anxiety   . Depression   . Chicken pox as a child  . Vitamin D deficiency   . Smoker   . Depression with anxiety   . Preventative health care 10/13/2011  . Allergic state 11/20/2011  . Headache(784.0) 11/20/2011  . Myalgia 11/20/2011  . Sinusitis acute 01/29/2012  . Hematuria 01/29/2012  . Renal lithiasis 01/29/2012  . Hip pain, right 01/29/2012  . Puncture wound of thigh, right 02/03/2012  . Wound infection 06/20/2012  . Abnormal results of thyroid function studies 01/28/2013  . Neck pain 05/13/2013  . Other and unspecified hyperlipidemia 05/13/2013  . Sinusitis, acute 01/29/2012  . Personal history of colonic adenoma 06/23/2013    06/23/2013 2 dminutive polyps    . Cancer     basal cell  . Raynaud phenomenon 04/15/2014    Past Surgical History  Procedure Laterality Date  . Carpal tunnel release      on right and left  . Cesarean section  1994  . Lithotripsy    . Colon polyps      1 was precancerous    Family History  Problem Relation Age of Onset  . Hypertension Mother   . Stroke Father   . Hypertension Father   . Cancer Father     leukemia  . Colon polyps Father   . Depression Sister   . Anxiety disorder Sister   . Colon polyps Sister   . Heart disease  Maternal Grandfather   . Colon polyps Brother   . Colon cancer Neg Hx   . Stomach cancer Neg Hx     History   Social History  . Marital Status: Legally Separated    Spouse Name: N/A    Number of Children: N/A  . Years of Education: N/A   Occupational History  . Not on file.   Social History Main Topics  . Smoking status: Current Every Day Smoker -- 15 years    Types: Cigarettes  . Smokeless tobacco: Never Used     Comment: less than a pack a day  . Alcohol Use: No  . Drug Use: No  . Sexual Activity: Not Currently    Partners: Male    Birth Control/ Protection: None   Other Topics Concern  . Not on file   Social History Narrative  . No narrative on file    Current Outpatient Prescriptions on File Prior to Visit  Medication Sig Dispense Refill  . ALPRAZolam (XANAX) 0.25 MG tablet Take 1 tablet (0.25 mg total) by mouth 2 (two) times daily as needed for anxiety.  10 tablet  0  . cyclobenzaprine (FLEXERIL) 10 MG tablet Take 1 tablet (10 mg total) by mouth at bedtime as needed for muscle spasms.  [place]  tablet  0  . gabapentin (NEURONTIN) 100 MG capsule TAKE 1 TO 2 CAPSULES 3 TO 4 TIMES A DAY AS NEEDED FOR ANXIETY  360 capsule  1  . loratadine (CLARITIN) 10 MG tablet Take 10 mg by mouth daily.       No current facility-administered medications on file prior to visit.    Allergies  Allergen Reactions  . Eggs Or Egg-Derived Products Nausea And Vomiting    cramping  . Neosporin [Neomycin-Bacitracin Zn-Polymyx] Itching    Inflamed, itchy   . Penicillins Hives  . Codeine     GI upset    Review of Systems  Review of Systems  Constitutional: Negative for fever and malaise/fatigue.  HENT: Negative for congestion.   Eyes: Negative for discharge.  Respiratory: Negative for shortness of breath.   Cardiovascular: Negative for chest pain, palpitations and leg swelling.  Gastrointestinal: Negative for nausea, abdominal pain and diarrhea.  Genitourinary: Negative for dysuria.   Musculoskeletal: Negative for falls.  Skin: Negative for rash.  Neurological: Negative for loss of consciousness and headaches.  Endo/Heme/Allergies: Negative for polydipsia.  Psychiatric/Behavioral: Positive for depression. Negative for suicidal ideas. The patient is nervous/anxious. The patient does not have insomnia.     Objective  BP 108/67  Pulse 74  Temp(Src) 98.4 F (36.9 C) (Oral)  Ht 5\' 1"  (1.549 m)  Wt 111 lb 6.4 oz (50.531 kg)  BMI 21.06 kg/m2  SpO2 99%  LMP 11/06/2008  Physical Exam  Physical Exam  Constitutional: She is oriented to person, place, and time and well-developed, well-nourished, and in no distress. No distress.  HENT:  Head: Normocephalic and atraumatic.  Eyes: Conjunctivae are normal.  Neck: Neck supple. No thyromegaly present.  Cardiovascular: Normal rate, regular rhythm and normal heart sounds.   No murmur heard. Pulmonary/Chest: Effort normal and breath sounds normal. She has no wheezes.  Abdominal: She exhibits no distension and no mass.  Musculoskeletal: She exhibits no edema.  Lymphadenopathy:    She has no cervical adenopathy.  Neurological: She is alert and oriented to person, place, and time.  Skin: Skin is warm and dry. No rash noted. She is not diaphoretic.  Psychiatric: Memory, affect and judgment normal.    Lab Results  Component Value Date   TSH 2.025 05/08/2013   Lab Results  Component Value Date   WBC 7.5 05/08/2013   HGB 15.1* 05/08/2013   HCT 42.2 05/08/2013   MCV 88.5 05/08/2013   PLT 311 05/08/2013   Lab Results  Component Value Date   CREATININE 0.92 05/08/2013   BUN 6 05/08/2013   NA 141 05/08/2013   K 3.9 05/08/2013   CL 106 05/08/2013   CO2 32 05/08/2013   Lab Results  Component Value Date   ALT 13 05/08/2013   AST 17 05/08/2013   ALKPHOS 87 05/08/2013   BILITOT 0.3 05/08/2013   Lab Results  Component Value Date   CHOL 197 05/08/2013   Lab Results  Component Value Date   HDL 59 05/08/2013   Lab Results   Component Value Date   LDLCALC 86 05/08/2013   Lab Results  Component Value Date   TRIG 258* 05/08/2013   Lab Results  Component Value Date   CHOLHDL 3.3 05/08/2013     Assessment & Plan  Raynaud phenomenon Describes symptoms mostly in hands encouraged to stay warm  Smoker Encouraged complete cessation. Discussed need to quit as relates to risk of numerous cancers, cardiac and pulmonary disease as well as neurologic complications.  Counseled for greater than 3 minutes  Depression with anxiety Worsening agrees to start Effexor and decrease Lexapro.   Other and unspecified hyperlipidemia Encouraged heart healthy diet, increase exercise, avoid trans fats, consider a krill oil cap daily

## 2014-04-15 NOTE — Assessment & Plan Note (Signed)
Encouraged heart healthy diet, increase exercise, avoid trans fats, consider a krill oil cap daily 

## 2014-04-15 NOTE — Assessment & Plan Note (Signed)
Worsening agrees to start Effexor and decrease Lexapro.

## 2014-04-15 NOTE — Assessment & Plan Note (Signed)
Encouraged complete cessation. Discussed need to quit as relates to risk of numerous cancers, cardiac and pulmonary disease as well as neurologic complications. Counseled for greater than 3 minutes 

## 2014-06-04 ENCOUNTER — Encounter: Payer: Self-pay | Admitting: Family Medicine

## 2014-06-11 ENCOUNTER — Encounter: Payer: Self-pay | Admitting: Family Medicine

## 2014-06-11 ENCOUNTER — Ambulatory Visit (INDEPENDENT_AMBULATORY_CARE_PROVIDER_SITE_OTHER): Payer: Self-pay | Admitting: Family Medicine

## 2014-06-11 VITALS — BP 113/64 | HR 88 | Temp 98.0°F | Ht 61.0 in | Wt 114.0 lb

## 2014-06-11 DIAGNOSIS — Z720 Tobacco use: Secondary | ICD-10-CM

## 2014-06-11 DIAGNOSIS — F17200 Nicotine dependence, unspecified, uncomplicated: Secondary | ICD-10-CM

## 2014-06-11 DIAGNOSIS — F418 Other specified anxiety disorders: Secondary | ICD-10-CM

## 2014-06-11 MED ORDER — CITALOPRAM HYDROBROMIDE 20 MG PO TABS: 20.0000 mg | ORAL_TABLET | Freq: Every day | ORAL | Status: DC

## 2014-06-11 MED ORDER — VENLAFAXINE HCL ER 37.5 MG PO CP24: 37.5000 mg | ORAL_CAPSULE | Freq: Every day | ORAL | Status: DC

## 2014-06-11 MED ORDER — ALPRAZOLAM 0.25 MG PO TABS: 0.2500 mg | ORAL_TABLET | Freq: Two times a day (BID) | ORAL | Status: DC | PRN

## 2014-06-11 NOTE — Progress Notes (Signed)
Patient ID: NIMA BAMBURG, female   DOB: [birth year], 53 y.o.   MRN: [id number] LYANNE KATES [id number] 1961-01-26 06/11/2014      Progress Note-Follow Up  Subjective  Chief Complaint  Chief Complaint  Patient presents with  . Follow-up    10 week    HPI  Patient is a 53 year old female in today for routine medical care. She reports her sister repeorts she is more even tempered since switching to Effexor but she does not like feeling like a Zombie all the time. No othe c/o NO GI disturbance or suicidal ideation. Denies CP/palp/SOB/HA/congestion/fevers/GI or GU c/o. Taking meds as prescribed  Past Medical History  Diagnosis Date  . Allergy   . Anxiety   . Depression   . Chicken pox as a child  . Vitamin D deficiency   . Smoker   . Depression with anxiety   . Preventative health care 10/13/2011  . Allergic state 11/20/2011  . Headache(784.0) 11/20/2011  . Myalgia 11/20/2011  . Sinusitis acute 01/29/2012  . Hematuria 01/29/2012  . Renal lithiasis 01/29/2012  . Hip pain, right 01/29/2012  . Puncture wound of thigh, right 02/03/2012  . Wound infection 06/20/2012  . Abnormal results of thyroid function studies 01/28/2013  . Neck pain 05/13/2013  . Other and unspecified hyperlipidemia 05/13/2013  . Sinusitis, acute 01/29/2012  . Personal history of colonic adenoma 06/23/2013    06/23/2013 2 dminutive polyps    . Cancer     basal cell  . Raynaud phenomenon 04/15/2014    Past Surgical History  Procedure Laterality Date  . Carpal tunnel release      on right and left  . Cesarean section  1994  . Lithotripsy    . Colon polyps      1 was precancerous    Family History  Problem Relation Age of Onset  . Hypertension Mother   . Stroke Father   . Hypertension Father   . Cancer Father     leukemia  . Colon polyps Father   . Depression Sister   . Anxiety disorder Sister   . Colon polyps Sister   . Heart disease Maternal Grandfather   . Colon polyps Brother   . Colon cancer Neg  Hx   . Stomach cancer Neg Hx     History   Social History  . Marital Status: Legally Separated    Spouse Name: N/A    Number of Children: N/A  . Years of Education: N/A   Occupational History  . Not on file.   Social History Main Topics  . Smoking status: Current Every Day Smoker -- 15 years    Types: Cigarettes  . Smokeless tobacco: Never Used     Comment: less than a pack a day  . Alcohol Use: No  . Drug Use: No  . Sexual Activity:    Partners: Male    Birth Control/ Protection: None   Other Topics Concern  . Not on file   Social History Narrative    Current Outpatient Prescriptions on File Prior to Visit  Medication Sig Dispense Refill  . cyclobenzaprine (FLEXERIL) 10 MG tablet Take 1 tablet (10 mg total) by mouth at bedtime as needed for muscle spasms. 30 tablet 0  . gabapentin (NEURONTIN) 100 MG capsule TAKE 1 TO 2 CAPSULES 3 TO 4 TIMES A DAY AS NEEDED FOR ANXIETY 360 capsule 1  . loratadine (CLARITIN) 10 MG tablet Take 10 mg by mouth daily.    Marland Kitchen  venlafaxine XR (EFFEXOR XR) 75 MG 24 hr capsule Take 1 capsule (75 mg total) by mouth daily with breakfast. 30 capsule 2  . ALPRAZolam (XANAX) 0.25 MG tablet Take 1 tablet (0.25 mg total) by mouth 2 (two) times daily as needed for anxiety. 10 tablet 0   No current facility-administered medications on file prior to visit.    Allergies  Allergen Reactions  . Eggs Or Egg-Derived Products Nausea And Vomiting    cramping  . Neosporin [Neomycin-Bacitracin Zn-Polymyx] Itching    Inflamed, itchy   . Penicillins Hives  . Codeine     GI upset    Review of Systems  Review of Systems  Constitutional: Positive for malaise/fatigue. Negative for fever.  HENT: Negative for congestion.   Eyes: Negative for discharge.  Respiratory: Negative for shortness of breath.   Cardiovascular: Negative for chest pain, palpitations and leg swelling.  Gastrointestinal: Negative for nausea, abdominal pain and diarrhea.  Genitourinary:  Negative for dysuria.  Musculoskeletal: Negative for falls.  Skin: Negative for rash.  Neurological: Positive for dizziness. Negative for loss of consciousness and headaches.  Endo/Heme/Allergies: Negative for polydipsia.  Psychiatric/Behavioral: Negative for depression and suicidal ideas. The patient is nervous/anxious. The patient does not have insomnia.     Objective  BP 113/64 mmHg  Pulse 88  Temp(Src) 98 F (36.7 C) (Oral)  Ht 5\' 1"  (1.549 m)  Wt 114 lb (51.71 kg)  BMI 21.55 kg/m2  SpO2 100%  LMP 11/06/2008  Physical Exam  Physical Exam  Constitutional: She is oriented to person, place, and time and well-developed, well-nourished, and in no distress. No distress.  HENT:  Head: Normocephalic and atraumatic.  Eyes: Conjunctivae are normal.  Neck: Neck supple. No thyromegaly present.  Cardiovascular: Normal rate, regular rhythm and normal heart sounds.   No murmur heard. Pulmonary/Chest: Effort normal and breath sounds normal. She has no wheezes.  Abdominal: She exhibits no distension and no mass.  Musculoskeletal: She exhibits no edema.  Lymphadenopathy:    She has no cervical adenopathy.  Neurological: She is alert and oriented to person, place, and time.  Skin: Skin is warm and dry. No rash noted. She is not diaphoretic.  Psychiatric: Memory, affect and judgment normal.    Lab Results  Component Value Date   TSH 2.025 05/08/2013   Lab Results  Component Value Date   WBC 7.5 05/08/2013   HGB 15.1* 05/08/2013   HCT 42.2 05/08/2013   MCV 88.5 05/08/2013   PLT 311 05/08/2013   Lab Results  Component Value Date   CREATININE 0.92 05/08/2013   BUN 6 05/08/2013   NA 141 05/08/2013   K 3.9 05/08/2013   CL 106 05/08/2013   CO2 32 05/08/2013   Lab Results  Component Value Date   ALT 13 05/08/2013   AST 17 05/08/2013   ALKPHOS 87 05/08/2013   BILITOT 0.3 05/08/2013   Lab Results  Component Value Date   CHOL 197 05/08/2013   Lab Results  Component  Value Date   HDL 59 05/08/2013   Lab Results  Component Value Date   LDLCALC 86 05/08/2013   Lab Results  Component Value Date   TRIG 258* 05/08/2013   Lab Results  Component Value Date   CHOLHDL 3.3 05/08/2013     Assessment & Plan  Depression with anxiety Shredded RX for Alprazolam given to patient on 10/03/2013 for #10 and 0 refill and new rx given Decrease Effexor to 37.5 mg daily x 10 days then quit,  start Citalopram 10 mg po daily x 7 days then increase to 1 tab daily  Smoker Still smoking 1 ppd. Encouraged complete cessation. Discussed need to quit as relates to risk of numerous cancers, cardiac and pulmonary disease as well as neurologic complications. Counseled for greater than 3 minutes

## 2014-06-11 NOTE — Progress Notes (Signed)
Pre visit review using our clinic review tool, if applicable. No additional management support is needed unless otherwise documented below in the visit note. 

## 2014-06-11 NOTE — Assessment & Plan Note (Signed)
Shredded RX for Alprazolam given to patient on 10/03/2013 for #10 and 0 refill and new rx given Decrease Effexor to 37.5 mg daily x 10 days then quit, start Citalopram 10 mg po daily x 7 days then increase to 1 tab daily

## 2014-06-11 NOTE — Patient Instructions (Signed)
Drop the Venlafaxine to 37.5 mg daily x 7 days then every other day til gone Start Citalopram 20 mg 1/2 tab daily  X 7days then increase to 1 tab daily   Depression Depression refers to feeling sad, low, down in the dumps, blue, gloomy, or empty. In general, there are two kinds of depression: 1. Normal sadness or normal grief. This kind of depression is one that we all feel from time to time after upsetting life experiences, such as the loss of a job or the ending of a relationship. This kind of depression is considered normal, is short lived, and resolves within a few days to 2 weeks. Depression experienced after the loss of a loved one (bereavement) often lasts longer than 2 weeks but normally gets better with time. 2. Clinical depression. This kind of depression lasts longer than normal sadness or normal grief or interferes with your ability to function at home, at work, and in school. It also interferes with your personal relationships. It affects almost every aspect of your life. Clinical depression is an illness. Symptoms of depression can also be caused by conditions other than those mentioned above, such as:  Physical illness. Some physical illnesses, including underactive thyroid gland (hypothyroidism), severe anemia, specific types of cancer, diabetes, uncontrolled seizures, heart and lung problems, strokes, and chronic pain are commonly associated with symptoms of depression.  Side effects of some prescription medicine. In some people, certain types of medicine can cause symptoms of depression.  Substance abuse. Abuse of alcohol and illicit drugs can cause symptoms of depression. SYMPTOMS Symptoms of normal sadness and normal grief include the following:  Feeling sad or crying for short periods of time.  Not caring about anything (apathy).  Difficulty sleeping or sleeping too much.  No longer able to enjoy the things you used to enjoy.  Desire to be by oneself all the time  (social isolation).  Lack of energy or motivation.  Difficulty concentrating or remembering.  Change in appetite or weight.  Restlessness or agitation. Symptoms of clinical depression include the same symptoms of normal sadness or normal grief and also the following symptoms:  Feeling sad or crying all the time.  Feelings of guilt or worthlessness.  Feelings of hopelessness or helplessness.  Thoughts of suicide or the desire to harm yourself (suicidal ideation).  Loss of touch with reality (psychotic symptoms). Seeing or hearing things that are not real (hallucinations) or having false beliefs about your life or the people around you (delusions and paranoia). DIAGNOSIS  The diagnosis of clinical depression is usually based on how bad the symptoms are and how long they have lasted. Your health care provider will also ask you questions about your medical history and substance use to find out if physical illness, use of prescription medicine, or substance abuse is causing your depression. Your health care provider may also order blood tests. TREATMENT  Often, normal sadness and normal grief do not require treatment. However, sometimes antidepressant medicine is given for bereavement to ease the depressive symptoms until they resolve. The treatment for clinical depression depends on how bad the symptoms are but often includes antidepressant medicine, counseling with a mental health professional, or both. Your health care provider will help to determine what treatment is best for you. Depression caused by physical illness usually goes away with appropriate medical treatment of the illness. If prescription medicine is causing depression, talk with your health care provider about stopping the medicine, decreasing the dose, or changing to another medicine.  Depression caused by the abuse of alcohol or illicit drugs goes away when you stop using these substances. Some adults need professional help in  order to stop drinking or using drugs. SEEK IMMEDIATE MEDICAL CARE IF:  You have thoughts about hurting yourself or others.  You lose touch with reality (have psychotic symptoms).  You are taking medicine for depression and have a serious side effect. FOR MORE INFORMATION  National Alliance on Mental Illness: www.nami.[employer]: https://carter.com/ Document Released: 07/17/2000 Document Revised: 12/04/2013 Document Reviewed: 10/19/2011 [facility] Patient Information 2015 [place], [place]. This information is not intended to replace advice given to you by your health care provider. Make sure you discuss any questions you have with your health care provider.

## 2014-06-12 ENCOUNTER — Telehealth: Payer: Self-pay | Admitting: Family Medicine

## 2014-06-12 MED ORDER — CITALOPRAM HYDROBROMIDE 20 MG PO TABS: 20.0000 mg | ORAL_TABLET | Freq: Every day | ORAL | Status: DC

## 2014-06-12 MED ORDER — VENLAFAXINE HCL ER 37.5 MG PO CP24: 37.5000 mg | ORAL_CAPSULE | Freq: Every day | ORAL | Status: DC

## 2014-06-12 NOTE — Telephone Encounter (Signed)
Pt was seen yesterday, stated dr. Charlett Blake informed her to let her know if the rx citalopram (CELEXA) 20 MG tablet and venlafaxine XR (EFFEXOR XR) 37.5 was to expensive at [facility]. Pt states she need the rx to go to [facility] on [address] [phone],

## 2014-06-12 NOTE — Telephone Encounter (Signed)
RXs sent.

## 2014-06-14 NOTE — Assessment & Plan Note (Signed)
Still smoking 1 ppd. Encouraged complete cessation. Discussed need to quit as relates to risk of numerous cancers, cardiac and pulmonary disease as well as neurologic complications. Counseled for greater than 3 minutes

## 2014-06-19 ENCOUNTER — Ambulatory Visit: Payer: Managed Care, Other (non HMO) | Admitting: Family Medicine

## 2014-07-16 ENCOUNTER — Ambulatory Visit: Payer: BC Managed Care – PPO | Admitting: Nurse Practitioner

## 2014-10-11 ENCOUNTER — Ambulatory Visit: Payer: Self-pay | Admitting: Family Medicine

## 2014-10-12 ENCOUNTER — Other Ambulatory Visit: Payer: Self-pay | Admitting: Family Medicine

## 2014-10-16 ENCOUNTER — Encounter: Payer: Self-pay | Admitting: Family Medicine

## 2014-10-16 ENCOUNTER — Ambulatory Visit (INDEPENDENT_AMBULATORY_CARE_PROVIDER_SITE_OTHER): Payer: Self-pay | Admitting: Family Medicine

## 2014-10-16 VITALS — BP 112/68 | HR 60 | Temp 97.9°F | Ht 61.0 in | Wt 113.2 lb

## 2014-10-16 DIAGNOSIS — F418 Other specified anxiety disorders: Secondary | ICD-10-CM

## 2014-10-16 MED ORDER — CLONAZEPAM 0.5 MG PO TABS: 0.5000 mg | ORAL_TABLET | Freq: Two times a day (BID) | ORAL | Status: DC | PRN

## 2014-10-16 MED ORDER — CITALOPRAM HYDROBROMIDE 20 MG PO TABS: 40.0000 mg | ORAL_TABLET | Freq: Every day | ORAL | Status: DC

## 2014-10-16 NOTE — Patient Instructions (Signed)
BOCES, [employer] job retraining, try the [place] Disorder Social anxiety disorder, previously called social phobia, is a mental disorder. People with social anxiety disorder frequently feel nervous, afraid, or embarrassed when around other people in social situations. They constantly worry that other people are judging or criticizing them for how they look, what they say, or how they act. They may worry that other people might reject them because of their appearance or behavior. Social anxiety disorder is more than just occasional shyness or self-consciousness. It can cause severe emotional distress. It can interfere with daily life activities. Social anxiety disorder also may lead to excessive alcohol or drug use and even suicide.  Social anxiety disorder is actually one of the most common mental disorders. It can develop at any time but usually starts in the teenage years. Women are more commonly affected than men. Social anxiety disorder is also more common in people who have family members with anxiety disorders. It also is more common in people who have physical deformities or conditions with characteristics that are obvious to others, such as stuttered speech or movement abnormalities (Parkinson disease).  SYMPTOMS  In addition to feeling anxious or fearful in social situations, people with social anxiety disorder frequently have physical symptoms. Examples include:  Red face (blushing).  Racing heart.  Sweating.  Shaky hands or voice.  Confusion.  Light-headedness.  Upset stomach and diarrhea. DIAGNOSIS  Social anxiety disorder is diagnosed through an assessment by your health care provider. Your health care provider will ask you questions about your mood, thoughts, and reactions in social situations. Your health care provider may ask you about your medical history and use of alcohol or drugs, including prescription medicines. Certain medical conditions and the use of  certain substances, including caffeine, can cause symptoms similar to social anxiety disorder. Your health care provider may refer you to a mental health specialist for further evaluation or treatment. The criteria for diagnosis of social anxiety disorder are:  Marked fear or anxiety in one or more social situations in which you may be closely watched or studied by others. Examples of such situations include:  Interacting socially (having a conversation with others, going to a party, or meeting strangers).  Being observed (eating or drinking in public or being called on in class).  Performing in front of others (giving a speech).  The social situations of concern almost always cause fear or anxiety, not just occasionally.  People with social anxiety disorder fear that they will be viewed negatively in a way that will be embarrassing, will lead to rejection, or will offend others. This fear is out of proportion to the actual threat posed by the social situation.  Often the triggering social situations are avoided, or they are endured with intense fear or anxiety. The fear, anxiety, or avoidance is persistent and lasts for 6 months or longer.  The anxiety causes difficulty functioning in at least some parts of your daily life. TREATMENT  Several types of treatment are available for social anxiety disorder. These treatments are often used in combination and include:   Talk therapy. Group talk therapy allows you to see that you are not alone with these problems. Individual talk therapy helps you address your specific anxiety issues with a caring professional. The most effective forms of talk therapy for social anxiety disorder are cognitive-behavioral therapy and exposure therapy. Cognitive-behavioral therapy helps you to identify and change negative thoughts and beliefs that are at the root of the disorder.  Exposure therapy allows you to gradually face the situations that you fear  most.  Relaxation and coping techniques. These include deep breathing, self-talk, meditation, visual imagery, and yoga. Relaxation techniques help to keep you calm in social situations.  Social Company secretary.Social skills can be learned on your own or with the help of a talk therapist. They can help you feel more confident and comfortable in social situations.  Medicine. For anxiety limited to performance situations (performance anxiety), medicine called beta blockers can help by reducing or preventing the physical symptoms of social anxiety disorder. For more persistent and generalized social anxiety, antidepressant medicine may be prescribed to help control symptoms. In severe cases of social anxiety disorder, strong antianxiety medicine, called benzodiazepines, may be prescribed on a limited basis and for a short time. Document Released: 06/18/2005 Document Revised: 12/04/2013 Document Reviewed: 10/18/2012 [facility] Patient Information 2015 [place], [place]. This information is not intended to replace advice given to you by your health care provider. Make sure you discuss any questions you have with your health care provider.

## 2014-10-16 NOTE — Progress Notes (Signed)
Pre visit review using our clinic review tool, if applicable. No additional management support is needed unless otherwise documented below in the visit note. 

## 2014-10-21 NOTE — Assessment & Plan Note (Addendum)
Tolerated Celexa but has had significant stressors lately will increase to 40 mg daily and monitor may continue Clonazepam prn, signed contract today, aware of need for UDS on occasion moving forward. Without health insurance today is looking for work

## 2014-10-21 NOTE — Progress Notes (Signed)
Autumn Myers  [id number] [birth year] 10/21/2014      Progress Note-Follow Up  Subjective  Chief Complaint  Chief Complaint  Patient presents with  . Follow-up    HPI  Patient is a 54 y.o. female in today for routine medical care. Patient in today to discuss ongoing stressors. She's recently had to move the continues to be unemployed. She acknowledges Celexa has helped somewhat but incompletely. No suicidal ideation but there is some anhedonia. Uses clonazepam as needed with good results. Denies CP/palp/SOB/HA/congestion/fevers/GI or GU c/o. Taking meds as prescribed  Past Medical History  Diagnosis Date  . Allergy   . Anxiety   . Depression   . Chicken pox as a child  . Vitamin D deficiency   . Smoker   . Depression with anxiety   . Preventative health care 10/13/2011  . Allergic state 11/20/2011  . Headache(784.0) 11/20/2011  . Myalgia 11/20/2011  . Sinusitis acute 01/29/2012  . Hematuria 01/29/2012  . Renal lithiasis 01/29/2012  . Hip pain, right 01/29/2012  . Puncture wound of thigh, right 02/03/2012  . Wound infection 06/20/2012  . Abnormal results of thyroid function studies 01/28/2013  . Neck pain 05/13/2013  . Other and unspecified hyperlipidemia 05/13/2013  . Sinusitis, acute 01/29/2012  . Personal history of colonic adenoma 06/23/2013    06/23/2013 2 dminutive polyps    . Cancer     basal cell  . Raynaud phenomenon 04/15/2014    Past Surgical History  Procedure Laterality Date  . Carpal tunnel release      on right and left  . Cesarean section  1994  . Lithotripsy    . Colon polyps      1 was precancerous    Family History  Problem Relation Age of Onset  . Hypertension Mother   . Stroke Father   . Hypertension Father   . Cancer Father     leukemia  . Colon polyps Father   . Depression Sister   . Anxiety disorder Sister   . Colon polyps Sister   . Heart disease Maternal Grandfather   . Colon polyps Brother   . Colon cancer Neg Hx   . Stomach  cancer Neg Hx     History   Social History  . Marital Status: Legally Separated    Spouse Name: N/A  . Number of Children: N/A  . Years of Education: N/A   Occupational History  . Not on file.   Social History Main Topics  . Smoking status: Current Every Day Smoker -- 15 years    Types: Cigarettes  . Smokeless tobacco: Never Used     Comment: less than a pack a day  . Alcohol Use: No  . Drug Use: No  . Sexual Activity:    Partners: Male    Birth Control/ Protection: None   Other Topics Concern  . Not on file   Social History Narrative    Current Outpatient Prescriptions on File Prior to Visit  Medication Sig Dispense Refill  . cyclobenzaprine (FLEXERIL) 10 MG tablet Take 1 tablet (10 mg total) by mouth at bedtime as needed for muscle spasms. 30 tablet 0  . gabapentin (NEURONTIN) 100 MG capsule TAKE 1 TO 2 CAPSULES 3 TO 4 TIMES A DAY AS NEEDED FOR ANXIETY 360 capsule 1  . loratadine (CLARITIN) 10 MG tablet Take 10 mg by mouth daily.     No current facility-administered medications on file prior to visit.    Allergies  Allergen  Reactions  . Eggs Or Egg-Derived Products Nausea And Vomiting    cramping  . Neosporin [Neomycin-Bacitracin Zn-Polymyx] Itching    Inflamed, itchy   . Penicillins Hives  . Codeine     GI upset    Review of Systems  Review of Systems  Constitutional: Negative for fever and malaise/fatigue.  HENT: Negative for congestion.   Eyes: Negative for discharge.  Respiratory: Negative for shortness of breath.   Cardiovascular: Negative for chest pain, palpitations and leg swelling.  Gastrointestinal: Negative for nausea, abdominal pain and diarrhea.  Genitourinary: Negative for dysuria.  Musculoskeletal: Negative for falls.  Skin: Negative for rash.  Neurological: Negative for loss of consciousness and headaches.  Endo/Heme/Allergies: Negative for polydipsia.  Psychiatric/Behavioral: Positive for depression. Negative for suicidal ideas and  substance abuse. The patient is nervous/anxious. The patient does not have insomnia.     Objective  BP 112/68 mmHg  Pulse 60  Temp(Src) 97.9 F (36.6 C) (Oral)  Ht 5\' 1"  (1.549 m)  Wt 113 lb 4 oz (51.37 kg)  BMI 21.41 kg/m2  SpO2 98%  LMP 11/06/2008  Physical Exam  Physical Exam  Constitutional: She is oriented to person, place, and time and well-developed, well-nourished, and in no distress. No distress.  HENT:  Head: Normocephalic and atraumatic.  Eyes: Conjunctivae are normal.  Neck: Neck supple. No thyromegaly present.  Cardiovascular: Normal rate, regular rhythm and normal heart sounds.   No murmur heard. Pulmonary/Chest: Effort normal and breath sounds normal. She has no wheezes.  Abdominal: She exhibits no distension and no mass.  Musculoskeletal: She exhibits no edema.  Lymphadenopathy:    She has no cervical adenopathy.  Neurological: She is alert and oriented to person, place, and time.  Skin: Skin is warm and dry. No rash noted. She is not diaphoretic.  Psychiatric: Memory, affect and judgment normal.    Lab Results  Component Value Date   TSH 2.025 05/08/2013   Lab Results  Component Value Date   WBC 7.5 05/08/2013   HGB 15.1* 05/08/2013   HCT 42.2 05/08/2013   MCV 88.5 05/08/2013   PLT 311 05/08/2013   Lab Results  Component Value Date   CREATININE 0.92 05/08/2013   BUN 6 05/08/2013   NA 141 05/08/2013   K 3.9 05/08/2013   CL 106 05/08/2013   CO2 32 05/08/2013   Lab Results  Component Value Date   ALT 13 05/08/2013   AST 17 05/08/2013   ALKPHOS 87 05/08/2013   BILITOT 0.3 05/08/2013   Lab Results  Component Value Date   CHOL 197 05/08/2013   Lab Results  Component Value Date   HDL 59 05/08/2013   Lab Results  Component Value Date   LDLCALC 86 05/08/2013   Lab Results  Component Value Date   TRIG 258* 05/08/2013   Lab Results  Component Value Date   CHOLHDL 3.3 05/08/2013     Assessment & Plan  Depression with  anxiety Tolerated Celexa but has had significant stressors lately will increase to 40 mg daily and monitor may continue Clonazepam prn, signed contract today, aware of need for UDS on occasion moving forward. Without health insurance today is looking for work

## 2014-10-24 ENCOUNTER — Telehealth: Payer: Self-pay | Admitting: Family Medicine

## 2014-10-24 NOTE — Telephone Encounter (Signed)
Caller name: Roseanne Relation to pt: self Call back number: [phone] Pharmacy:  Reason for call:   Patient states that she does not want to change her medication regarding last visit. Patient states that she wants to talk to Dr. Charlett Blake regarding this.

## 2014-10-24 NOTE — Telephone Encounter (Signed)
Patient states that her medication was increased last office visit (appears to be Celexa) and she wanted to make you aware that she was going to take the lower original dose.  She would only like to speak to Dr. Charlett Blake or Shirlean Mylar due to having to explain to several people, she states it is hard to explain.  Patient is requesting call back (ok if it is next week- patient notified that they were out of office this week).

## 2014-10-24 NOTE — Telephone Encounter (Signed)
Autumn Myers can you call her and see what her concerns are next week. It is fine that she has dropped back to previous dose.

## 2014-10-29 NOTE — Telephone Encounter (Signed)
She is welcome to wait 6 months as long as she is doing well.

## 2014-10-29 NOTE — Telephone Encounter (Signed)
Called the patient informed ok 6 months.  Did cancel for the patient 4 month f/u and did rescheduled her for 04/18/15 with PCP at 10 AM.

## 2014-10-29 NOTE — Telephone Encounter (Signed)
Called the patient back today Monday 10/29/14.  She wanted PCP to know she never filled the hardcopy for clonazepam (she is doing much better and does not need it) and never picked up increase for celexa prescription sent to pharmacy.  She is still taking once daily and never increased to taking 2 daily. Her question was PCP requested she come back in 4 months not 6 months.  She has no insurance and since never filling the clonazepam is it ok to cancel upcoming appt. In 4 months and rescheduled for 6 months?  She is aware PCP is out of the office until 11/05/14 and ok not hearing back until then.

## 2015-01-29 ENCOUNTER — Encounter: Payer: Self-pay | Admitting: Gastroenterology

## 2015-02-15 ENCOUNTER — Ambulatory Visit: Payer: Self-pay | Admitting: Family Medicine

## 2015-04-18 ENCOUNTER — Ambulatory Visit: Payer: Self-pay | Admitting: Family Medicine

## 2015-05-21 ENCOUNTER — Encounter: Payer: Self-pay | Admitting: Family Medicine

## 2015-05-21 ENCOUNTER — Ambulatory Visit (INDEPENDENT_AMBULATORY_CARE_PROVIDER_SITE_OTHER): Payer: Self-pay | Admitting: Family Medicine

## 2015-05-21 VITALS — BP 118/80 | HR 62 | Temp 97.8°F | Ht 61.0 in | Wt 110.4 lb

## 2015-05-21 DIAGNOSIS — Z720 Tobacco use: Secondary | ICD-10-CM

## 2015-05-21 DIAGNOSIS — F418 Other specified anxiety disorders: Secondary | ICD-10-CM

## 2015-05-21 DIAGNOSIS — M62838 Other muscle spasm: Secondary | ICD-10-CM

## 2015-05-21 DIAGNOSIS — T7840XD Allergy, unspecified, subsequent encounter: Secondary | ICD-10-CM

## 2015-05-21 DIAGNOSIS — M542 Cervicalgia: Secondary | ICD-10-CM

## 2015-05-21 DIAGNOSIS — F17200 Nicotine dependence, unspecified, uncomplicated: Secondary | ICD-10-CM

## 2015-05-21 MED ORDER — CYCLOBENZAPRINE HCL 10 MG PO TABS
10.0000 mg | ORAL_TABLET | Freq: Every evening | ORAL | Status: DC | PRN
Start: 2015-05-21 — End: 2016-06-17

## 2015-05-21 MED ORDER — CITALOPRAM HYDROBROMIDE 20 MG PO TABS: 20.0000 mg | ORAL_TABLET | Freq: Every day | ORAL | Status: DC

## 2015-05-21 NOTE — Patient Instructions (Signed)
Salon pas gel and patches or Aspercreme with LidocaineBack Pain, Adult Back pain is very common in adults.The cause of back pain is rarely dangerous and the pain often gets better over time.The cause of your back pain may not be known. Some common causes of back pain include:  Strain of the muscles or ligaments supporting the spine.  Wear and tear (degeneration) of the spinal disks.  Arthritis.  Direct injury to the back. For many people, back pain may return. Since back pain is rarely dangerous, most people can learn to manage this condition on their own. HOME CARE INSTRUCTIONS Watch your back pain for any changes. The following actions may help to lessen any discomfort you are feeling:  Remain active. It is stressful on your back to sit or stand in one place for long periods of time. Do not sit, drive, or stand in one place for more than 30 minutes at a time. Take short walks on even surfaces as soon as you are able.Try to increase the length of time you walk each day.  Exercise regularly as directed by your health care provider. Exercise helps your back heal faster. It also helps avoid future injury by keeping your muscles strong and flexible.  Do not stay in bed.Resting more than 1-2 days can delay your recovery.  Pay attention to your body when you bend and lift. The most comfortable positions are those that put less stress on your recovering back. Always use proper lifting techniques, including:  Bending your knees.  Keeping the load close to your body.  Avoiding twisting.  Find a comfortable position to sleep. Use a firm mattress and lie on your side with your knees slightly bent. If you lie on your back, put a pillow under your knees.  Avoid feeling anxious or stressed.Stress increases muscle tension and can worsen back pain.It is important to recognize when you are anxious or stressed and learn ways to manage it, such as with exercise.  Take medicines only as directed  by your health care provider. Over-the-counter medicines to reduce pain and inflammation are often the most helpful.Your health care provider may prescribe muscle relaxant drugs.These medicines help dull your pain so you can more quickly return to your normal activities and healthy exercise.  Apply ice to the injured area:  Put ice in a plastic bag.  Place a towel between your skin and the bag.  Leave the ice on for 20 minutes, 2-3 times a day for the first 2-3 days. After that, ice and heat may be alternated to reduce pain and spasms.  Maintain a healthy weight. Excess weight puts extra stress on your back and makes it difficult to maintain good posture. SEEK MEDICAL CARE IF:  You have pain that is not relieved with rest or medicine.  You have increasing pain going down into the legs or buttocks.  You have pain that does not improve in one week.  You have night pain.  You lose weight.  You have a fever or chills. SEEK IMMEDIATE MEDICAL CARE IF:   You develop new bowel or bladder control problems.  You have unusual weakness or numbness in your arms or legs.  You develop nausea or vomiting.  You develop abdominal pain.  You feel faint.   This information is not intended to replace advice given to you by your health care provider. Make sure you discuss any questions you have with your health care provider.   Document Released: 07/20/2005 Document Revised: 08/10/2014 Document  Reviewed: 11/21/2013 Elsevier Interactive Patient Education [employer].

## 2015-05-21 NOTE — Progress Notes (Signed)
Pre visit review using our clinic review tool, if applicable. No additional management support is needed unless otherwise documented below in the visit note. 

## 2015-05-30 ENCOUNTER — Telehealth: Payer: Self-pay | Admitting: Family Medicine

## 2015-05-30 DIAGNOSIS — M62838 Other muscle spasm: Secondary | ICD-10-CM

## 2015-05-30 NOTE — Telephone Encounter (Signed)
Called [facility] phoned in refill for cyclobenzaprine 10 mg #30 with 6 refills as evidently they did not receive what was sent in on 05/21/15.  Patient informed script has been phoned in.

## 2015-05-30 NOTE — Telephone Encounter (Signed)
Please resend RX for muscle relaxer to [facility]  on [address] - they did not receive it. Pt went to pick it up and they stated it was not received.

## 2015-06-02 ENCOUNTER — Encounter: Payer: Self-pay | Admitting: Family Medicine

## 2015-06-02 NOTE — Assessment & Plan Note (Deleted)
Loratatine prn and nasal saline prn

## 2015-06-02 NOTE — Assessment & Plan Note (Addendum)
Tolerating Citalopram will continue, Clonazepam  Not useful will discontinue

## 2015-06-02 NOTE — Assessment & Plan Note (Signed)
Encouraged complete cessation. Discussed need to quit as relates to risk of numerous cancers, cardiac and pulmonary disease as well as neurologic complications. Counseled for greater than 3 minutes 

## 2015-06-02 NOTE — Progress Notes (Signed)
Subjective:    Patient ID: Autumn Myers, female    DOB: Nov 05, 1960, 54 y.o.   MRN: [id number]  Chief Complaint  Patient presents with  . Follow-up    HPI Patient is in today for follow-up on numerous concerns. She has stopped gabapentin and she did not find it helpful given the side effects. Clonazepam was also not helpful. Citalopram has been more helpful so she has continued that. Cyclobenzaprine has been helpful intermittently. No recent illness. Unfortunately she continues to smoke and struggles with intermittent congestion and allergies as well. No recent acute illness. Denies CP/palp/SOB/HAfevers/GI or GU c/o. Taking meds as prescribed  Past Medical History  Diagnosis Date  . Allergy   . Anxiety   . Depression   . Chicken pox as a child  . Vitamin D deficiency   . Smoker   . Depression with anxiety   . Preventative health care 10/13/2011  . Allergic state 11/20/2011  . Headache(784.0) 11/20/2011  . Myalgia 11/20/2011  . Sinusitis acute 01/29/2012  . Hematuria 01/29/2012  . Renal lithiasis 01/29/2012  . Hip pain, right 01/29/2012  . Puncture wound of thigh, right 02/03/2012  . Wound infection ([place]) 06/20/2012  . Abnormal results of thyroid function studies 01/28/2013  . Neck pain 05/13/2013  . Other and unspecified hyperlipidemia 05/13/2013  . Sinusitis, acute 01/29/2012  . Personal history of colonic adenoma 06/23/2013    06/23/2013 2 dminutive polyps    . Cancer ([place])     basal cell  . Raynaud phenomenon 04/15/2014    Past Surgical History  Procedure Laterality Date  . Carpal tunnel release      on right and left  . Cesarean section  1994  . Lithotripsy    . Colon polyps      1 was precancerous    Family History  Problem Relation Age of Onset  . Hypertension Mother   . Parkinsonism Mother   . Stroke Father   . Hypertension Father   . Cancer Father     leukemia  . Colon polyps Father   . Depression Sister   . Anxiety disorder Sister   . Colon polyps Sister    . Heart disease Maternal Grandfather   . Colon polyps Brother   . Colon cancer Neg Hx   . Stomach cancer Neg Hx   . Parkinsonism Maternal Grandmother     Social History   Social History  . Marital Status: Legally Separated    Spouse Name: N/A  . Number of Children: N/A  . Years of Education: N/A   Occupational History  . Not on file.   Social History Main Topics  . Smoking status: Current Every Day Smoker -- 15 years    Types: Cigarettes  . Smokeless tobacco: Never Used     Comment: less than a pack a day  . Alcohol Use: No  . Drug Use: No  . Sexual Activity:    Partners: Male    Birth Control/ Protection: None   Other Topics Concern  . Not on file   Social History Narrative    Outpatient Prescriptions Prior to Visit  Medication Sig Dispense Refill  . loratadine (CLARITIN) 10 MG tablet Take 10 mg by mouth daily.    . citalopram (CELEXA) 20 MG tablet Take 2 tablets (40 mg total) by mouth daily. 60 tablet 3  . cyclobenzaprine (FLEXERIL) 10 MG tablet Take 1 tablet (10 mg total) by mouth at bedtime as needed for muscle spasms. 30 tablet  0  . gabapentin (NEURONTIN) 100 MG capsule TAKE 1 TO 2 CAPSULES 3 TO 4 TIMES A DAY AS NEEDED FOR ANXIETY 360 capsule 1  . clonazePAM (KLONOPIN) 0.5 MG tablet Take 1 tablet (0.5 mg total) by mouth 2 (two) times daily as needed for anxiety. (Patient not taking: Reported on 05/21/2015) 60 tablet 1   No facility-administered medications prior to visit.    Allergies  Allergen Reactions  . Eggs Or Egg-Derived Products Nausea And Vomiting    cramping  . Neosporin [Neomycin-Bacitracin Zn-Polymyx] Itching    Inflamed, itchy   . Penicillins Hives  . Codeine     GI upset    Review of Systems  Constitutional: Positive for malaise/fatigue. Negative for fever.  HENT: Negative for congestion.   Eyes: Negative for discharge.  Respiratory: Negative for shortness of breath.   Cardiovascular: Negative for chest pain, palpitations and leg  swelling.  Gastrointestinal: Negative for nausea and abdominal pain.  Genitourinary: Negative for dysuria.  Musculoskeletal: Positive for back pain and neck pain. Negative for falls.  Skin: Negative for rash.  Neurological: Negative for loss of consciousness and headaches.  Endo/Heme/Allergies: Negative for environmental allergies.  Psychiatric/Behavioral: Positive for depression. The patient is nervous/anxious.        Objective:    Physical Exam  Constitutional: She is oriented to person, place, and time. She appears well-developed and well-nourished. No distress.  HENT:  Head: Normocephalic and atraumatic.  Nose: Nose normal.  Eyes: Right eye exhibits no discharge. Left eye exhibits no discharge.  Neck: Normal range of motion. Neck supple.  Cardiovascular: Normal rate and regular rhythm.   No murmur heard. Pulmonary/Chest: Effort normal and breath sounds normal.  Abdominal: Soft. Bowel sounds are normal. There is no tenderness.  Musculoskeletal: She exhibits no edema.  Neurological: She is alert and oriented to person, place, and time.  Skin: Skin is warm and dry.  Psychiatric: She has a normal mood and affect.  Nursing note and vitals reviewed.   BP 118/80 mmHg  Pulse 62  Temp(Src) 97.8 F (36.6 C) (Oral)  Ht 5\' 1"  (1.549 m)  Wt 110 lb 6 oz (50.066 kg)  BMI 20.87 kg/m2  SpO2 100%  LMP 11/06/2008 Wt Readings from Last 3 Encounters:  05/21/15 110 lb 6 oz (50.066 kg)  10/16/14 113 lb 4 oz (51.37 kg)  06/11/14 114 lb (51.71 kg)     Lab Results  Component Value Date   WBC 7.5 05/08/2013   HGB 15.1* 05/08/2013   HCT 42.2 05/08/2013   PLT 311 05/08/2013   GLUCOSE 53* 05/08/2013   CHOL 197 05/08/2013   TRIG 258* 05/08/2013   HDL 59 05/08/2013   LDLCALC 86 05/08/2013   ALT 13 05/08/2013   AST 17 05/08/2013   NA 141 05/08/2013   K 3.9 05/08/2013   CL 106 05/08/2013   CREATININE 0.92 05/08/2013   BUN 6 05/08/2013   CO2 32 05/08/2013   TSH 2.025 05/08/2013     Lab Results  Component Value Date   TSH 2.025 05/08/2013   Lab Results  Component Value Date   WBC 7.5 05/08/2013   HGB 15.1* 05/08/2013   HCT 42.2 05/08/2013   MCV 88.5 05/08/2013   PLT 311 05/08/2013   Lab Results  Component Value Date   NA 141 05/08/2013   K 3.9 05/08/2013   CO2 32 05/08/2013   GLUCOSE 53* 05/08/2013   BUN 6 05/08/2013   CREATININE 0.92 05/08/2013   BILITOT 0.3 05/08/2013  ALKPHOS 87 05/08/2013   AST 17 05/08/2013   ALT 13 05/08/2013   PROT 7.0 05/08/2013   ALBUMIN 4.9 05/08/2013   ALBUMIN 4.9 05/08/2013   CALCIUM 10.1 05/08/2013   GFR 75.31 11/20/2011   Lab Results  Component Value Date   CHOL 197 05/08/2013   Lab Results  Component Value Date   HDL 59 05/08/2013   Lab Results  Component Value Date   LDLCALC 86 05/08/2013   Lab Results  Component Value Date   TRIG 258* 05/08/2013   Lab Results  Component Value Date   CHOLHDL 3.3 05/08/2013   No results found for: HGBA1C     Assessment & Plan:   Problem List Items Addressed This Visit    Smoker    Encouraged complete cessation. Discussed need to quit as relates to risk of numerous cancers, cardiac and pulmonary disease as well as neurologic complications. Counseled for greater than 3 minutes      Neck pain    Encouraged moist heat and gentle stretching as tolerated. May try NSAIDs and prescription meds as directed and report if symptoms worsen or seek immediate care. Gabapentin not helpful at doses she tolerates. Will discontinue. May use Cyclobenzaprine prn      Depression with anxiety    Tolerating Citalopram will continue, Clonazepam  Not useful will discontinue      Allergic state    Other Visit Diagnoses    Muscle spasm    -  Primary    Relevant Medications    cyclobenzaprine (FLEXERIL) 10 MG tablet       I have discontinued Ms. Carano's gabapentin and clonazePAM. I have also changed her citalopram. Additionally, I am having her maintain her loratadine and  cyclobenzaprine.  Meds ordered this encounter  Medications  . cyclobenzaprine (FLEXERIL) 10 MG tablet    Sig: Take 1 tablet (10 mg total) by mouth at bedtime as needed for muscle spasms.    Dispense:  30 tablet    Refill:  6  . citalopram (CELEXA) 20 MG tablet    Sig: Take 1 tablet (20 mg total) by mouth daily.    Dispense:  30 tablet    Refill:  6     Penni Homans, MD

## 2015-06-02 NOTE — Assessment & Plan Note (Addendum)
Encouraged moist heat and gentle stretching as tolerated. May try NSAIDs and prescription meds as directed and report if symptoms worsen or seek immediate care. Gabapentin not helpful at doses she tolerates. Will discontinue. May use Cyclobenzaprine prn

## 2015-07-07 ENCOUNTER — Other Ambulatory Visit: Payer: Self-pay | Admitting: Family Medicine

## 2015-09-05 ENCOUNTER — Other Ambulatory Visit: Payer: Self-pay | Admitting: Family Medicine

## 2015-09-05 MED ORDER — CITALOPRAM HYDROBROMIDE 20 MG PO TABS: 40.0000 mg | ORAL_TABLET | Freq: Every day | ORAL | Status: DC

## 2015-12-17 ENCOUNTER — Encounter: Payer: Self-pay | Admitting: Family Medicine

## 2015-12-17 ENCOUNTER — Ambulatory Visit (INDEPENDENT_AMBULATORY_CARE_PROVIDER_SITE_OTHER): Payer: Self-pay | Admitting: Family Medicine

## 2015-12-17 VITALS — BP 88/62 | HR 72 | Temp 98.2°F | Ht 61.0 in | Wt 107.1 lb

## 2015-12-17 DIAGNOSIS — R519 Headache, unspecified: Secondary | ICD-10-CM

## 2015-12-17 DIAGNOSIS — Z720 Tobacco use: Secondary | ICD-10-CM

## 2015-12-17 DIAGNOSIS — E559 Vitamin D deficiency, unspecified: Secondary | ICD-10-CM

## 2015-12-17 DIAGNOSIS — R51 Headache: Secondary | ICD-10-CM

## 2015-12-17 DIAGNOSIS — M25551 Pain in right hip: Secondary | ICD-10-CM

## 2015-12-17 DIAGNOSIS — M545 Low back pain: Secondary | ICD-10-CM

## 2015-12-17 DIAGNOSIS — F418 Other specified anxiety disorders: Secondary | ICD-10-CM

## 2015-12-17 DIAGNOSIS — F17200 Nicotine dependence, unspecified, uncomplicated: Secondary | ICD-10-CM

## 2015-12-17 MED ORDER — GABAPENTIN 100 MG PO CAPS: 100.0000 mg | ORAL_CAPSULE | Freq: Three times a day (TID) | ORAL | Status: DC

## 2015-12-17 MED ORDER — CITALOPRAM HYDROBROMIDE 20 MG PO TABS: 40.0000 mg | ORAL_TABLET | Freq: Every day | ORAL | Status: DC

## 2015-12-17 NOTE — Patient Instructions (Signed)

## 2015-12-17 NOTE — Progress Notes (Signed)
Pre visit review using our clinic review tool, if applicable. No additional management support is needed unless otherwise documented below in the visit note. 

## 2015-12-17 NOTE — Progress Notes (Signed)
Subjective:    Patient ID: Autumn Myers, female    DOB: 03-Apr-1961, 55 y.o.   MRN: [id number]  Chief Complaint  Patient presents with  . Follow-up    HPI Patient is in today for follow up. Patient reports some concerns with back pain that is causing sciatica down left leg and struggles with getting up in the morning.  Patient has still been struggling with depression, anxiety and stress.     Past Medical History  Diagnosis Date  . Allergy   . Anxiety   . Depression   . Chicken pox as a child  . Vitamin D deficiency   . Smoker   . Depression with anxiety   . Preventative health care 10/13/2011  . Allergic state 11/20/2011  . Headache(784.0) 11/20/2011  . Myalgia 11/20/2011  . Sinusitis acute 01/29/2012  . Hematuria 01/29/2012  . Renal lithiasis 01/29/2012  . Hip pain, right 01/29/2012  . Puncture wound of thigh, right 02/03/2012  . Wound infection ([place]) 06/20/2012  . Abnormal results of thyroid function studies 01/28/2013  . Neck pain 05/13/2013  . Other and unspecified hyperlipidemia 05/13/2013  . Sinusitis, acute 01/29/2012  . Personal history of colonic adenoma 06/23/2013    06/23/2013 2 dminutive polyps    . Cancer ([place])     basal cell  . Raynaud phenomenon 04/15/2014    Past Surgical History  Procedure Laterality Date  . Carpal tunnel release      on right and left  . Cesarean section  1994  . Lithotripsy    . Colon polyps      1 was precancerous    Family History  Problem Relation Age of Onset  . Hypertension Mother   . Parkinsonism Mother   . Stroke Father   . Hypertension Father   . Cancer Father     leukemia  . Colon polyps Father   . Depression Sister   . Anxiety disorder Sister   . Colon polyps Sister   . Heart disease Maternal Grandfather   . Colon polyps Brother   . Colon cancer Neg Hx   . Stomach cancer Neg Hx   . Parkinsonism Maternal Grandmother     Social History   Social History  . Marital Status: Legally Separated    Spouse Name:  N/A  . Number of Children: N/A  . Years of Education: N/A   Occupational History  . Not on file.   Social History Main Topics  . Smoking status: Current Every Day Smoker -- 15 years    Types: Cigarettes  . Smokeless tobacco: Never Used     Comment: less than a pack a day  . Alcohol Use: No  . Drug Use: No  . Sexual Activity:    Partners: Male    Birth Control/ Protection: None   Other Topics Concern  . Not on file   Social History Narrative    Outpatient Prescriptions Prior to Visit  Medication Sig Dispense Refill  . cyclobenzaprine (FLEXERIL) 10 MG tablet Take 1 tablet (10 mg total) by mouth at bedtime as needed for muscle spasms. 30 tablet 6  . loratadine (CLARITIN) 10 MG tablet Take 10 mg by mouth daily.    . citalopram (CELEXA) 20 MG tablet Take 1 tablet (20 mg total) by mouth daily. 30 tablet 6  . citalopram (CELEXA) 20 MG tablet Take 2 tablets (40 mg total) by mouth daily. 60 tablet 2   No facility-administered medications prior to visit.  Allergies  Allergen Reactions  . Eggs Or Egg-Derived Products Nausea And Vomiting    cramping  . Neosporin [Neomycin-Bacitracin Zn-Polymyx] Itching    Inflamed, itchy   . Penicillins Hives  . Codeine     GI upset    Review of Systems  Constitutional: Negative for fever and malaise/fatigue.  HENT: Negative for congestion.   Eyes: Negative for blurred vision.  Respiratory: Negative for shortness of breath.   Cardiovascular: Negative for chest pain, palpitations and leg swelling.  Gastrointestinal: Negative for nausea, abdominal pain and blood in stool.  Genitourinary: Negative for dysuria and frequency.  Musculoskeletal: Positive for back pain. Negative for falls.  Skin: Negative for rash.  Neurological: Negative for dizziness, loss of consciousness and headaches.  Endo/Heme/Allergies: Negative for environmental allergies.  Psychiatric/Behavioral: Positive for depression. The patient is nervous/anxious.          Objective:    Physical Exam  Constitutional: She is oriented to person, place, and time. She appears well-developed and well-nourished. No distress.  HENT:  Head: Normocephalic and atraumatic.  Eyes: Conjunctivae are normal.  Neck: Neck supple. No thyromegaly present.  Cardiovascular: Normal rate, regular rhythm and normal heart sounds.   No murmur heard. Pulmonary/Chest: Effort normal and breath sounds normal. No respiratory distress.  Abdominal: Soft. Bowel sounds are normal. She exhibits no distension and no mass. There is no tenderness.  Musculoskeletal: She exhibits no edema.  Lymphadenopathy:    She has no cervical adenopathy.  Neurological: She is alert and oriented to person, place, and time.  Skin: Skin is warm and dry.  Psychiatric: She has a normal mood and affect. Her behavior is normal.    BP 88/62 mmHg  Pulse 72  Temp(Src) 98.2 F (36.8 C) (Oral)  Ht 5\' 1"  (1.549 m)  Wt 107 lb 2 oz (48.592 kg)  BMI 20.25 kg/m2  SpO2 96%  LMP 11/06/2008 Wt Readings from Last 3 Encounters:  12/17/15 107 lb 2 oz (48.592 kg)  05/21/15 110 lb 6 oz (50.066 kg)  10/16/14 113 lb 4 oz (51.37 kg)     Lab Results  Component Value Date   WBC 7.5 05/08/2013   HGB 15.1* 05/08/2013   HCT 42.2 05/08/2013   PLT 311 05/08/2013   GLUCOSE 53* 05/08/2013   CHOL 197 05/08/2013   TRIG 258* 05/08/2013   HDL 59 05/08/2013   LDLCALC 86 05/08/2013   ALT 13 05/08/2013   AST 17 05/08/2013   NA 141 05/08/2013   K 3.9 05/08/2013   CL 106 05/08/2013   CREATININE 0.92 05/08/2013   BUN 6 05/08/2013   CO2 32 05/08/2013   TSH 2.025 05/08/2013    Lab Results  Component Value Date   TSH 2.025 05/08/2013   Lab Results  Component Value Date   WBC 7.5 05/08/2013   HGB 15.1* 05/08/2013   HCT 42.2 05/08/2013   MCV 88.5 05/08/2013   PLT 311 05/08/2013   Lab Results  Component Value Date   NA 141 05/08/2013   K 3.9 05/08/2013   CO2 32 05/08/2013   GLUCOSE 53* 05/08/2013   BUN 6  05/08/2013   CREATININE 0.92 05/08/2013   BILITOT 0.3 05/08/2013   ALKPHOS 87 05/08/2013   AST 17 05/08/2013   ALT 13 05/08/2013   PROT 7.0 05/08/2013   ALBUMIN 4.9 05/08/2013   ALBUMIN 4.9 05/08/2013   CALCIUM 10.1 05/08/2013   GFR 75.31 11/20/2011   Lab Results  Component Value Date   CHOL 197 05/08/2013  Lab Results  Component Value Date   HDL 59 05/08/2013   Lab Results  Component Value Date   LDLCALC 86 05/08/2013   Lab Results  Component Value Date   TRIG 258* 05/08/2013   Lab Results  Component Value Date   CHOLHDL 3.3 05/08/2013   No results found for: HGBA1C     Assessment & Plan:   Problem List Items Addressed This Visit    Vitamin D deficiency    Labs reveal deficiency. Also take daily Vitamin D over the counter. If already taking a daily supplement increase by 1000 IU daily and if not start Vitamin D 2000 IU daily.       Smoker    Encouraged complete cessation. Discussed need to quit as relates to risk of numerous cancers, cardiac and pulmonary disease as well as neurologic complications. Counseled for greater than 3 minutes      Hip pain, right    Low back pain. Encouraged moist heat and gentle stretching as tolerated. May try NSAIDs and prescription meds as directed and report if symptoms worsen or seek immediate care. Flexeril prn      Headache    Encouraged increased hydration, 64 ounces of clear fluids daily. Minimize alcohol and caffeine. Eat small frequent meals with lean proteins and complex carbs. Avoid high and low blood sugars. Get adequate sleep, 7-8 hours a night. Needs exercise daily preferably in the morning.      Relevant Medications   citalopram (CELEXA) 20 MG tablet   gabapentin (NEURONTIN) 100 MG capsule   Depression with anxiety    Continues to struggle with stress but is managing on celexa will continue at 40 mg daily       Other Visit Diagnoses    Low back pain, unspecified back pain laterality, with sciatica presence  unspecified    -  Primary    Relevant Orders    DG Lumbar Spine Complete       I have discontinued Ms. Kresse's citalopram. I have also changed her citalopram. Additionally, I am having her start on gabapentin. Lastly, I am having her maintain her loratadine, cyclobenzaprine, and cholecalciferol.  Meds ordered this encounter  Medications  . cholecalciferol (VITAMIN D) 1000 units tablet    Sig: Take 1,000 Units by mouth daily.  . citalopram (CELEXA) 20 MG tablet    Sig: Take 2 tablets (40 mg total) by mouth daily.    Dispense:  60 tablet    Refill:  2  . gabapentin (NEURONTIN) 100 MG capsule    Sig: Take 1 capsule (100 mg total) by mouth 3 (three) times daily.    Dispense:  90 capsule    Refill:  3     Penni Homans, MD

## 2015-12-22 NOTE — Assessment & Plan Note (Signed)
Encouraged increased hydration, 64 ounces of clear fluids daily. Minimize alcohol and caffeine. Eat small frequent meals with lean proteins and complex carbs. Avoid high and low blood sugars. Get adequate sleep, 7-8 hours a night. Needs exercise daily preferably in the morning.  

## 2015-12-22 NOTE — Assessment & Plan Note (Signed)
Labs reveal deficiency.  Also take daily Vitamin D over the counter. If already taking a daily supplement increase by 1000 IU daily and if not start Vitamin D 2000 IU daily.  

## 2015-12-22 NOTE — Assessment & Plan Note (Signed)
Low back pain. Encouraged moist heat and gentle stretching as tolerated. May try NSAIDs and prescription meds as directed and report if symptoms worsen or seek immediate care. Flexeril prn

## 2015-12-22 NOTE — Assessment & Plan Note (Signed)
Continues to struggle with stress but is managing on celexa will continue at 40 mg daily

## 2015-12-22 NOTE — Assessment & Plan Note (Signed)
Encouraged complete cessation. Discussed need to quit as relates to risk of numerous cancers, cardiac and pulmonary disease as well as neurologic complications. Counseled for greater than 3 minutes 

## 2016-03-24 ENCOUNTER — Encounter: Payer: Self-pay | Admitting: Family Medicine

## 2016-03-24 ENCOUNTER — Ambulatory Visit (INDEPENDENT_AMBULATORY_CARE_PROVIDER_SITE_OTHER): Payer: Self-pay | Admitting: Family Medicine

## 2016-03-24 DIAGNOSIS — F418 Other specified anxiety disorders: Secondary | ICD-10-CM

## 2016-03-24 MED ORDER — CITALOPRAM HYDROBROMIDE 20 MG PO TABS: 40.0000 mg | ORAL_TABLET | Freq: Every day | ORAL | 5 refills | Status: DC

## 2016-03-24 NOTE — Patient Instructions (Signed)
Generalized Anxiety Disorder Generalized anxiety disorder (GAD) is a mental disorder. It interferes with life functions, including relationships, work, and school. GAD is different from normal anxiety, which everyone experiences at some point in their lives in response to specific life events and activities. Normal anxiety actually helps us prepare for and get through these life events and activities. Normal anxiety goes away after the event or activity is over.  GAD causes anxiety that is not necessarily related to specific events or activities. It also causes excess anxiety in proportion to specific events or activities. The anxiety associated with GAD is also difficult to control. GAD can vary from mild to severe. People with severe GAD can have intense waves of anxiety with physical symptoms (panic attacks).  SYMPTOMS The anxiety and worry associated with GAD are difficult to control. This anxiety and worry are related to many life events and activities and also occur more days than not for 6 months or longer. People with GAD also have three or more of the following symptoms (one or more in children):  Restlessness.   Fatigue.  Difficulty concentrating.   Irritability.  Muscle tension.  Difficulty sleeping or unsatisfying sleep. DIAGNOSIS GAD is diagnosed through an assessment by your health care provider. Your health care provider will ask you questions aboutyour mood,physical symptoms, and events in your life. Your health care provider may ask you about your medical history and use of alcohol or drugs, including prescription medicines. Your health care provider may also do a physical exam and blood tests. Certain medical conditions and the use of certain substances can cause symptoms similar to those associated with GAD. Your health care provider may refer you to a mental health specialist for further evaluation. TREATMENT The following therapies are usually used to treat GAD:    Medication. Antidepressant medication usually is prescribed for long-term daily control. Antianxiety medicines may be added in severe cases, especially when panic attacks occur.   Talk therapy (psychotherapy). Certain types of talk therapy can be helpful in treating GAD by providing support, education, and guidance. A form of talk therapy called cognitive behavioral therapy can teach you healthy ways to think about and react to daily life events and activities.  Stress managementtechniques. These include yoga, meditation, and exercise and can be very helpful when they are practiced regularly. A mental health specialist can help determine which treatment is best for you. Some people see improvement with one therapy. However, other people require a combination of therapies.   This information is not intended to replace advice given to you by your health care provider. Make sure you discuss any questions you have with your health care provider.   Document Released: 11/14/2012 Document Revised: 08/10/2014 Document Reviewed: 11/14/2012 Elsevier Interactive Patient Education 2016 Elsevier Inc.  

## 2016-03-24 NOTE — Progress Notes (Signed)
Pre visit review using our clinic review tool, if applicable. No additional management support is needed unless otherwise documented below in the visit note. 

## 2016-04-06 NOTE — Progress Notes (Signed)
Patient ID: Autumn Myers, female   DOB: 1960-12-15, 55 y.o.   MRN: [id number]   Subjective:    Patient ID: Autumn Myers, female    DOB: [birth year], 55 y.o.   MRN: [id number]  Chief Complaint  Patient presents with  . Follow-up    HPI Patient is in today for follow up. No recent illness but has continued to struggle with some anhedonia. Angry outbursts have improved since increasing with Citalopram. No suicidal ideation. Denies CP/palp/SOB/HA/congestion/fevers/GI or GU c/o. Taking meds as prescribed  Past Medical History:  Diagnosis Date  . Abnormal results of thyroid function studies 01/28/2013  . Allergic state 11/20/2011  . Allergy   . Anxiety   . Cancer ([place])    basal cell  . Chicken pox as a child  . Depression   . Depression with anxiety   . Headache(784.0) 11/20/2011  . Hematuria 01/29/2012  . Hip pain, right 01/29/2012  . Myalgia 11/20/2011  . Neck pain 05/13/2013  . Other and unspecified hyperlipidemia 05/13/2013  . Personal history of colonic adenoma 06/23/2013   06/23/2013 2 dminutive polyps    . Preventative health care 10/13/2011  . Puncture wound of thigh, right 02/03/2012  . Raynaud phenomenon 04/15/2014  . Renal lithiasis 01/29/2012  . Sinusitis acute 01/29/2012  . Sinusitis, acute 01/29/2012  . Smoker   . Vitamin D deficiency   . Wound infection ([place]) 06/20/2012    Past Surgical History:  Procedure Laterality Date  . CARPAL TUNNEL RELEASE     on right and left  . CESAREAN SECTION  1994  . colon polyps     1 was precancerous  . LITHOTRIPSY      Family History  Problem Relation Age of Onset  . Hypertension Mother   . Parkinsonism Mother   . Stroke Father   . Hypertension Father   . Cancer Father     leukemia  . Colon polyps Father   . Depression Sister   . Anxiety disorder Sister   . Colon polyps Sister   . Heart disease Maternal Grandfather   . Colon polyps Brother   . Colon cancer Neg Hx   . Stomach cancer Neg Hx   . Parkinsonism Maternal  Grandmother     Social History   Social History  . Marital status: Legally Separated    Spouse name: N/A  . Number of children: N/A  . Years of education: N/A   Occupational History  . Not on file.   Social History Main Topics  . Smoking status: Current Every Day Smoker    Years: 15.00    Types: Cigarettes  . Smokeless tobacco: Never Used     Comment: less than a pack a day  . Alcohol use No  . Drug use: No  . Sexual activity: Not Currently    Partners: Male    Birth control/ protection: None   Other Topics Concern  . Not on file   Social History Narrative  . No narrative on file    Outpatient Medications Prior to Visit  Medication Sig Dispense Refill  . cholecalciferol (VITAMIN D) 1000 units tablet Take 1,000 Units by mouth daily.    . cyclobenzaprine (FLEXERIL) 10 MG tablet Take 1 tablet (10 mg total) by mouth at bedtime as needed for muscle spasms. 30 tablet 6  . gabapentin (NEURONTIN) 100 MG capsule Take 1 capsule (100 mg total) by mouth 3 (three) times daily. 90 capsule 3  . loratadine (CLARITIN) 10 MG tablet Take  10 mg by mouth daily.    . citalopram (CELEXA) 20 MG tablet Take 2 tablets (40 mg total) by mouth daily. 60 tablet 2   No facility-administered medications prior to visit.     Allergies  Allergen Reactions  . Eggs Or Egg-Derived Products Nausea And Vomiting    cramping  . Neosporin [Neomycin-Bacitracin Zn-Polymyx] Itching    Inflamed, itchy   . Penicillins Hives  . Codeine     GI upset    Review of Systems  Constitutional: Negative for fever and malaise/fatigue.  HENT: Negative for congestion.   Eyes: Negative for blurred vision.  Respiratory: Negative for shortness of breath.   Cardiovascular: Negative for chest pain, palpitations and leg swelling.  Gastrointestinal: Negative for abdominal pain, blood in stool and nausea.  Genitourinary: Negative for dysuria and frequency.  Musculoskeletal: Negative for falls.  Skin: Negative for rash.    Neurological: Negative for dizziness, loss of consciousness and headaches.  Endo/Heme/Allergies: Negative for environmental allergies.  Psychiatric/Behavioral: Positive for depression. The patient is nervous/anxious.        Objective:    Physical Exam  Constitutional: She is oriented to person, place, and time. She appears well-developed and well-nourished. No distress.  HENT:  Head: Normocephalic and atraumatic.  Nose: Nose normal.  Eyes: Right eye exhibits no discharge. Left eye exhibits no discharge.  Neck: Normal range of motion. Neck supple.  Cardiovascular: Normal rate and regular rhythm.   No murmur heard. Pulmonary/Chest: Effort normal and breath sounds normal.  Abdominal: Soft. Bowel sounds are normal. There is no tenderness.  Musculoskeletal: She exhibits no edema.  Neurological: She is alert and oriented to person, place, and time.  Skin: Skin is warm and dry.  Psychiatric: She has a normal mood and affect.  Nursing note and vitals reviewed.   BP 102/62 (BP Location: Left Arm, Patient Position: Sitting, Cuff Size: Normal)   Pulse 67   Temp 98 F (36.7 C) (Oral)   Ht 5\' 1"  (1.549 m)   Wt 105 lb 6 oz (47.8 kg)   LMP 11/06/2008   SpO2 98%   BMI 19.91 kg/m  Wt Readings from Last 3 Encounters:  03/24/16 105 lb 6 oz (47.8 kg)  12/17/15 107 lb 2 oz (48.6 kg)  05/21/15 110 lb 6 oz (50.1 kg)     Lab Results  Component Value Date   WBC 7.5 05/08/2013   HGB 15.1 (H) 05/08/2013   HCT 42.2 05/08/2013   PLT 311 05/08/2013   GLUCOSE 53 (L) 05/08/2013   CHOL 197 05/08/2013   TRIG 258 (H) 05/08/2013   HDL 59 05/08/2013   LDLCALC 86 05/08/2013   ALT 13 05/08/2013   AST 17 05/08/2013   NA 141 05/08/2013   K 3.9 05/08/2013   CL 106 05/08/2013   CREATININE 0.92 05/08/2013   BUN 6 05/08/2013   CO2 32 05/08/2013   TSH 2.025 05/08/2013    Lab Results  Component Value Date   TSH 2.025 05/08/2013   Lab Results  Component Value Date   WBC 7.5 05/08/2013   HGB  15.1 (H) 05/08/2013   HCT 42.2 05/08/2013   MCV 88.5 05/08/2013   PLT 311 05/08/2013   Lab Results  Component Value Date   NA 141 05/08/2013   K 3.9 05/08/2013   CO2 32 05/08/2013   GLUCOSE 53 (L) 05/08/2013   BUN 6 05/08/2013   CREATININE 0.92 05/08/2013   BILITOT 0.3 05/08/2013   ALKPHOS 87 05/08/2013   AST 17  05/08/2013   ALT 13 05/08/2013   PROT 7.0 05/08/2013   ALBUMIN 4.9 05/08/2013   ALBUMIN 4.9 05/08/2013   CALCIUM 10.1 05/08/2013   GFR 75.31 11/20/2011   Lab Results  Component Value Date   CHOL 197 05/08/2013   Lab Results  Component Value Date   HDL 59 05/08/2013   Lab Results  Component Value Date   LDLCALC 86 05/08/2013   Lab Results  Component Value Date   TRIG 258 (H) 05/08/2013   Lab Results  Component Value Date   CHOLHDL 3.3 05/08/2013   No results found for: HGBA1C     Assessment & Plan:   Problem List Items Addressed This Visit    Depression with anxiety    Still struggling but does believe the Citalopram is helpful. Her anger has been easier to control since increasing the Citalopram. Continue the same.        Other Visit Diagnoses   None.     I am having Ms. Wendt maintain her loratadine, cyclobenzaprine, cholecalciferol, gabapentin, and citalopram.  Meds ordered this encounter  Medications  . citalopram (CELEXA) 20 MG tablet    Sig: Take 2 tablets (40 mg total) by mouth daily.    Dispense:  60 tablet    Refill:  5     Penni Homans, MD

## 2016-04-06 NOTE — Assessment & Plan Note (Signed)
Still struggling but does believe the Citalopram is helpful. Her anger has been easier to control since increasing the Citalopram. Continue the same.

## 2016-05-07 ENCOUNTER — Encounter: Payer: Self-pay | Admitting: Family Medicine

## 2016-05-07 ENCOUNTER — Ambulatory Visit (INDEPENDENT_AMBULATORY_CARE_PROVIDER_SITE_OTHER): Payer: Self-pay | Admitting: Family Medicine

## 2016-05-07 VITALS — BP 92/50 | HR 75 | Temp 98.1°F | Ht 61.0 in | Wt 103.6 lb

## 2016-05-07 DIAGNOSIS — R3915 Urgency of urination: Secondary | ICD-10-CM

## 2016-05-07 DIAGNOSIS — R350 Frequency of micturition: Secondary | ICD-10-CM

## 2016-05-07 LAB — POC URINALSYSI DIPSTICK (AUTOMATED)
BILIRUBIN UA: NEGATIVE
Glucose, UA: NEGATIVE
Ketones, UA: NEGATIVE
NITRITE UA: NEGATIVE
PH UA: 6
Protein, UA: NEGATIVE
RBC UA: NEGATIVE
Spec Grav, UA: 1.015
Urobilinogen, UA: 0.2

## 2016-05-07 NOTE — Progress Notes (Signed)
Chief Complaint  Patient presents with  . Urinary Tract Infection    freq and urgency urination,pressure after urinating-sxs started on Sat    Autumn Myers is a 55 y.o. female here for possible UTI.  Duration: 5 days. Symptoms: urinary frequency, urinary hesitancy and urinary retention Denies: fever, nausea, vomiting, urinary incontinence, bleeding, vaginal discharge  Hx of recurrent UTI? No  Notes that things have improved from yesterday, just wants to make sure it is not an infection.  ROS:  Constitutional: denies fever GU: As noted in HPI MSK: Denies back pain  Abd: Denies constipation or abdominal pain  Past Medical History:  Diagnosis Date  . Abnormal results of thyroid function studies 01/28/2013  . Allergic state 11/20/2011  . Allergy   . Anxiety   . Cancer ([place])    basal cell  . Chicken pox as a child  . Depression   . Depression with anxiety   . Headache(784.0) 11/20/2011  . Hematuria 01/29/2012  . Hip pain, right 01/29/2012  . Myalgia 11/20/2011  . Neck pain 05/13/2013  . Other and unspecified hyperlipidemia 05/13/2013  . Personal history of colonic adenoma 06/23/2013   06/23/2013 2 dminutive polyps    . Preventative health care 10/13/2011  . Puncture wound of thigh, right 02/03/2012  . Raynaud phenomenon 04/15/2014  . Renal lithiasis 01/29/2012  . Sinusitis acute 01/29/2012  . Sinusitis, acute 01/29/2012  . Smoker   . Vitamin D deficiency   . Wound infection 06/20/2012   Family History  Problem Relation Age of Onset  . Hypertension Mother   . Parkinsonism Mother   . Stroke Father   . Hypertension Father   . Cancer Father     leukemia  . Colon polyps Father   . Depression Sister   . Anxiety disorder Sister   . Colon polyps Sister   . Heart disease Maternal Grandfather   . Colon polyps Brother   . Colon cancer Neg Hx   . Stomach cancer Neg Hx   . Parkinsonism Maternal Grandmother    Social History   Social History  . Marital status: Legally  Separated   Social History Main Topics  . Smoking status: Current Every Day Smoker    Years: 15.00    Types: Cigarettes  . Smokeless tobacco: Never Used     Comment: less than a pack a day  . Alcohol use No  . Drug use: No  . Sexual activity: Not Currently    Partners: Male    Birth control/ protection: None   BP (!) 92/50 (BP Location: Left Arm, Patient Position: Sitting, Cuff Size: Normal)   Pulse 75   Temp 98.1 F (36.7 C) (Oral)   Ht 5\' 1"  (1.549 m)   Wt 103 lb 9.6 oz (47 kg)   LMP 11/06/2008   SpO2 99%   BMI 19.58 kg/m  General: Awake, alert, appears stated age 41: MMM Heart: RRR, no murmurs Lungs: CTAB, normal respiratory effort, no accessory muscle usage Abd: BS+, soft, NT, ND, no masses or organomegaly MSK: No CVA tenderness, neg Lloyd's sign Psych: Age appropriate judgment and insight  Frequency of urination - Plan: POCT Urinalysis Dipstick (Automated), Urine Culture  Urgency of urination - Plan: POCT Urinalysis Dipstick (Automated), Urine Culture  Orders as above. Will send for culture, doesn't sound like her classic symptoms. If culture positive, will call in abx and notify patient. Hx of improvement doesn't fit with UTI dx either. Stop smoking. In pre-contemplative phase. OK to try pyridium  OTC for relief. Stay well hydrated. F/u prn. The patient voiced understanding and agreement to the plan.  [place], DO 05/07/16 4:22 PM

## 2016-05-07 NOTE — Progress Notes (Signed)
Pre visit review using our clinic review tool, if applicable. No additional management support is needed unless otherwise documented below in the visit note. 

## 2016-05-07 NOTE — Patient Instructions (Addendum)
AZO/pyridium may help with your symptoms. This is available OTC.

## 2016-05-10 LAB — URINE CULTURE

## 2016-06-17 ENCOUNTER — Other Ambulatory Visit: Payer: Self-pay | Admitting: Family Medicine

## 2016-06-17 DIAGNOSIS — M62838 Other muscle spasm: Secondary | ICD-10-CM

## 2016-06-17 NOTE — Telephone Encounter (Signed)
eScribe request from [facility] for refill on Cyclobenzaprine Last filled - 05/21/15, #30x6  Last AEX - 03/24/16 [I am having Ms. Legendre maintain her loratadine, cyclobenzaprine, cholecalciferol, gabapentin, and citalopram] Next AEX - 6-Mths Refill sent per [facility] refill protocol/SLS

## 2016-09-22 ENCOUNTER — Ambulatory Visit (INDEPENDENT_AMBULATORY_CARE_PROVIDER_SITE_OTHER): Payer: Self-pay | Admitting: Family Medicine

## 2016-09-22 ENCOUNTER — Encounter: Payer: Self-pay | Admitting: Family Medicine

## 2016-09-22 VITALS — BP 89/52 | HR 62 | Temp 98.2°F | Resp 18 | Wt 102.6 lb

## 2016-09-22 DIAGNOSIS — E559 Vitamin D deficiency, unspecified: Secondary | ICD-10-CM

## 2016-09-22 DIAGNOSIS — R109 Unspecified abdominal pain: Secondary | ICD-10-CM

## 2016-09-22 DIAGNOSIS — N200 Calculus of kidney: Secondary | ICD-10-CM

## 2016-09-22 DIAGNOSIS — R946 Abnormal results of thyroid function studies: Secondary | ICD-10-CM

## 2016-09-22 DIAGNOSIS — F17200 Nicotine dependence, unspecified, uncomplicated: Secondary | ICD-10-CM

## 2016-09-22 DIAGNOSIS — F418 Other specified anxiety disorders: Secondary | ICD-10-CM

## 2016-09-22 DIAGNOSIS — R51 Headache: Secondary | ICD-10-CM

## 2016-09-22 DIAGNOSIS — R519 Headache, unspecified: Secondary | ICD-10-CM

## 2016-09-22 DIAGNOSIS — R079 Chest pain, unspecified: Secondary | ICD-10-CM

## 2016-09-22 NOTE — Patient Instructions (Signed)
Vitamin D 2000 to 3000 daily Cholesterol Cholesterol is a white, waxy, fat-like substance that is needed by the human body in small amounts. The liver makes all the cholesterol we need. Cholesterol is carried from the liver by the blood through the blood vessels. Deposits of cholesterol (plaques) may build up on blood vessel (artery) walls. Plaques make the arteries narrower and stiffer. Cholesterol plaques increase the risk for heart attack and stroke. You cannot feel your cholesterol level even if it is very high. The only way to know that it is high is to have a blood test. Once you know your cholesterol levels, you should keep a record of the test results. Work with your health care provider to keep your levels in the desired range. What do the results mean?  Total cholesterol is a rough measure of all the cholesterol in your blood.  LDL (low-density lipoprotein) is the "bad" cholesterol. This is the type that causes plaque to build up on the artery walls. You want this level to be low.  HDL (high-density lipoprotein) is the "good" cholesterol because it cleans the arteries and carries the LDL away. You want this level to be high.  Triglycerides are fat that the body can either burn for energy or store. High levels are closely linked to heart disease. What are the desired levels of cholesterol?  Total cholesterol below 200.  LDL below 100 for people who are at risk, below 70 for people at very high risk.  HDL above 40 is good. A level of 60 or higher is considered to be protective against heart disease.  Triglycerides below 150. How can I lower my cholesterol? Diet  Follow your diet program as told by your health care provider.  Choose fish or white meat chicken and [place], roasted or baked. Limit fatty cuts of red meat, fried foods, and processed meats, such as sausage and lunch meats.  Eat lots of fresh fruits and vegetables.  Choose whole grains, beans, pasta, potatoes, and  cereals.  Choose olive oil, corn oil, or canola oil, and use only small amounts.  Avoid butter, mayonnaise, shortening, or palm kernel oils.  Avoid foods with trans fats.  Drink skim or nonfat milk and eat low-fat or nonfat yogurt and cheeses. Avoid whole milk, cream, ice cream, egg yolks, and full-fat cheeses.  Healthier desserts include angel food cake, ginger snaps, animal crackers, hard candy, popsicles, and low-fat or nonfat frozen yogurt. Avoid pastries, cakes, pies, and cookies. Exercise  Follow your exercise program as told by your health care provider. A regular program:  Helps to decrease LDL and raise HDL.  Helps with weight control.  Do things that increase your activity level, such as gardening, walking, and taking the stairs.  Ask your health care provider about ways that you can be more active in your daily life. Medicine  Take over-the-counter and prescription medicines only as told by your health care provider.  Medicine may be prescribed by your health care provider to help lower cholesterol and decrease the risk for heart disease. This is usually done if diet and exercise have failed to bring down cholesterol levels.  If you have several risk factors, you may need medicine even if your levels are normal. This information is not intended to replace advice given to you by your health care provider. Make sure you discuss any questions you have with your health care provider. Document Released: 04/14/2001 Document Revised: 02/15/2016 Document Reviewed: 01/18/2016 Elsevier Interactive Patient Education  2017  [employer].

## 2016-09-22 NOTE — Progress Notes (Signed)
Pre visit review using our clinic review tool, if applicable. No additional management support is needed unless otherwise documented below in the visit note. 

## 2016-09-22 NOTE — Progress Notes (Signed)
Patient ID: PATRA SHORTRIDGE, female   DOB: [birth year], 56 y.o.   MRN: [id number]   Subjective:    Patient ID: Autumn Myers, female    DOB: 05-13-1961, 56 y.o.   MRN: [id number]  Chief Complaint  Patient presents with  . Follow-up  I acted as a Education administrator for Dr. Charlett Blake. Nimsi Males, RMA   HPI  Patient is in today for a follow up. Patient has no acute concerns. She feels she is doing well on her Citalopram. She is dating a nice man and overall she feels she is happier and calmer lately. No recent febrile illness or acute concerns. Denies CP/palp/SOB/HA/congestion/fevers/GI or GU c/o. Taking meds as prescribed  Past Medical History:  Diagnosis Date  . Abnormal results of thyroid function studies 01/28/2013  . Allergic state 11/20/2011  . Allergy   . Anxiety   . Cancer ([place])    basal cell  . Chicken pox as a child  . Depression   . Depression with anxiety   . Headache(784.0) 11/20/2011  . Hematuria 01/29/2012  . Hip pain, right 01/29/2012  . Myalgia 11/20/2011  . Neck pain 05/13/2013  . Other and unspecified hyperlipidemia 05/13/2013  . Personal history of colonic adenoma 06/23/2013   06/23/2013 2 dminutive polyps    . Preventative health care 10/13/2011  . Puncture wound of thigh, right 02/03/2012  . Raynaud phenomenon 04/15/2014  . Renal lithiasis 01/29/2012  . Sinusitis acute 01/29/2012  . Sinusitis, acute 01/29/2012  . Smoker   . Vitamin D deficiency   . Wound infection 06/20/2012    Past Surgical History:  Procedure Laterality Date  . CARPAL TUNNEL RELEASE     on right and left  . CESAREAN SECTION  1994  . colon polyps     1 was precancerous  . LITHOTRIPSY      Family History  Problem Relation Age of Onset  . Hypertension Mother   . Parkinsonism Mother   . Stroke Father   . Hypertension Father   . Cancer Father     leukemia  . Colon polyps Father   . Depression Sister   . Anxiety disorder Sister   . Colon polyps Sister   . Heart disease Maternal Grandfather   . Colon  polyps Brother   . Colon cancer Neg Hx   . Stomach cancer Neg Hx   . Parkinsonism Maternal Grandmother     Social History   Social History  . Marital status: Legally Separated    Spouse name: N/A  . Number of children: N/A  . Years of education: N/A   Occupational History  . Not on file.   Social History Main Topics  . Smoking status: Current Every Day Smoker    Years: 15.00    Types: Cigarettes  . Smokeless tobacco: Never Used     Comment: less than a pack a day  . Alcohol use No  . Drug use: No  . Sexual activity: Not Currently    Partners: Male    Birth control/ protection: None   Other Topics Concern  . Not on file   Social History Narrative  . No narrative on file    Outpatient Medications Prior to Visit  Medication Sig Dispense Refill  . cholecalciferol (VITAMIN D) 1000 units tablet Take 1,000 Units by mouth daily.    . citalopram (CELEXA) 20 MG tablet Take 2 tablets (40 mg total) by mouth daily. 60 tablet 5  . cyclobenzaprine (FLEXERIL) 10 MG tablet TAKE ONE TABLET  BY MOUTH ONCE DAILY AT BEDTIME AS NEEDED FOR MUSCLE SPASM 30 tablet 2  . loratadine (CLARITIN) 10 MG tablet Take 10 mg by mouth daily.    Marland Kitchen gabapentin (NEURONTIN) 100 MG capsule Take 1 capsule (100 mg total) by mouth 3 (three) times daily. (Patient not taking: Reported on 05/07/2016) 90 capsule 3   No facility-administered medications prior to visit.     Allergies  Allergen Reactions  . Eggs Or Egg-Derived Products Nausea And Vomiting    cramping  . Neosporin [Neomycin-Bacitracin Zn-Polymyx] Itching    Inflamed, itchy   . Penicillins Hives  . Codeine     GI upset    Review of Systems  Constitutional: Negative for fever and malaise/fatigue.  HENT: Negative for congestion.   Eyes: Negative for blurred vision.  Respiratory: Negative for cough and shortness of breath.   Cardiovascular: Negative for chest pain, palpitations and leg swelling.  Gastrointestinal: Negative for vomiting.    Musculoskeletal: Negative for back pain.  Skin: Negative for rash.  Neurological: Negative for loss of consciousness and headaches.       Objective:    Physical Exam  Constitutional: She is oriented to person, place, and time. She appears well-developed and well-nourished. No distress.  HENT:  Head: Normocephalic and atraumatic.  Eyes: Conjunctivae are normal.  Neck: Normal range of motion. No thyromegaly present.  Cardiovascular: Normal rate and regular rhythm.   Pulmonary/Chest: Effort normal and breath sounds normal. She has no wheezes.  Abdominal: Soft. Bowel sounds are normal. There is no tenderness.  Musculoskeletal: Normal range of motion. She exhibits no edema or deformity.  Neurological: She is alert and oriented to person, place, and time.  Skin: Skin is warm and dry. She is not diaphoretic.  Psychiatric: She has a normal mood and affect.    BP (!) 89/52 (BP Location: Left Arm, Patient Position: Sitting, Cuff Size: Normal)   Pulse 62   Temp 98.2 F (36.8 C) (Oral)   Resp 18   Wt 102 lb 9.6 oz (46.5 kg)   LMP 11/16/2008   SpO2 99%   BMI 19.39 kg/m  Wt Readings from Last 3 Encounters:  09/22/16 102 lb 9.6 oz (46.5 kg)  05/07/16 103 lb 9.6 oz (47 kg)  03/24/16 105 lb 6 oz (47.8 kg)     Lab Results  Component Value Date   WBC 7.5 05/08/2013   HGB 15.1 (H) 05/08/2013   HCT 42.2 05/08/2013   PLT 311 05/08/2013   GLUCOSE 53 (L) 05/08/2013   CHOL 197 05/08/2013   TRIG 258 (H) 05/08/2013   HDL 59 05/08/2013   LDLCALC 86 05/08/2013   ALT 13 05/08/2013   AST 17 05/08/2013   NA 141 05/08/2013   K 3.9 05/08/2013   CL 106 05/08/2013   CREATININE 0.92 05/08/2013   BUN 6 05/08/2013   CO2 32 05/08/2013   TSH 2.025 05/08/2013    Lab Results  Component Value Date   TSH 2.025 05/08/2013   Lab Results  Component Value Date   WBC 7.5 05/08/2013   HGB 15.1 (H) 05/08/2013   HCT 42.2 05/08/2013   MCV 88.5 05/08/2013   PLT 311 05/08/2013   Lab Results   Component Value Date   NA 141 05/08/2013   K 3.9 05/08/2013   CO2 32 05/08/2013   GLUCOSE 53 (L) 05/08/2013   BUN 6 05/08/2013   CREATININE 0.92 05/08/2013   BILITOT 0.3 05/08/2013   ALKPHOS 87 05/08/2013   AST 17 05/08/2013  ALT 13 05/08/2013   PROT 7.0 05/08/2013   ALBUMIN 4.9 05/08/2013   ALBUMIN 4.9 05/08/2013   CALCIUM 10.1 05/08/2013   GFR 75.31 11/20/2011   Lab Results  Component Value Date   CHOL 197 05/08/2013   Lab Results  Component Value Date   HDL 59 05/08/2013   Lab Results  Component Value Date   LDLCALC 86 05/08/2013   Lab Results  Component Value Date   TRIG 258 (H) 05/08/2013   Lab Results  Component Value Date   CHOLHDL 3.3 05/08/2013   No results found for: HGBA1C     Assessment & Plan:   Problem List Items Addressed This Visit    None      I have discontinued Ms. Lukach's gabapentin. I am also having her maintain her loratadine, cholecalciferol, citalopram, and cyclobenzaprine.  No orders of the defined types were placed in this encounter.   CMA served as Education administrator during this visit. History, Physical and Plan performed by medical provider. Documentation and orders reviewed and attested to.  Magdalene Molly, [place]

## 2016-09-23 LAB — COMPREHENSIVE METABOLIC PANEL
ALT: 8 U/L (ref 0–35)
AST: 14 U/L (ref 0–37)
Albumin: 4.5 g/dL (ref 3.5–5.2)
Alkaline Phosphatase: 80 U/L (ref 39–117)
BUN: 9 mg/dL (ref 6–23)
CALCIUM: 9.9 mg/dL (ref 8.4–10.5)
CHLORIDE: 107 meq/L (ref 96–112)
CO2: 32 meq/L (ref 19–32)
CREATININE: 1 mg/dL (ref 0.40–1.20)
GFR: 61.27 mL/min (ref >60.00)
GLUCOSE: 96 mg/dL (ref 70–99)
Potassium: 4.8 mEq/L (ref 3.5–5.1)
Sodium: 142 mEq/L (ref 135–145)
Total Bilirubin: 0.3 mg/dL (ref 0.2–1.2)
Total Protein: 6.8 g/dL (ref 6.0–8.3)

## 2016-09-23 LAB — CBC
HEMATOCRIT: 38.9 % (ref 36.0–46.0)
Hemoglobin: 13.3 g/dL (ref 12.0–15.0)
MCHC: 34.3 g/dL (ref 30.0–36.0)
MCV: 92.5 fl (ref 78.0–100.0)
PLATELETS: 324 10*3/uL (ref 150.0–400.0)
RBC: 4.21 Mil/uL (ref 3.87–5.11)
RDW: 12.3 % (ref 11.5–15.5)
WBC: 9 10*3/uL (ref 4.0–10.5)

## 2016-09-23 LAB — TSH: TSH: 2.3 u[IU]/mL (ref 0.35–4.50)

## 2016-09-24 NOTE — Assessment & Plan Note (Signed)
Encouraged daily supplements 

## 2016-09-24 NOTE — Assessment & Plan Note (Signed)
Encouraged complete cessation. Discussed need to quit as relates to risk of numerous cancers, cardiac and pulmonary disease as well as neurologic complications. Counseled for greater than 3 minutes 

## 2016-09-24 NOTE — Assessment & Plan Note (Signed)
Doing very well on current meds

## 2016-10-22 ENCOUNTER — Other Ambulatory Visit: Payer: Self-pay | Admitting: Family Medicine

## 2017-04-13 ENCOUNTER — Ambulatory Visit (INDEPENDENT_AMBULATORY_CARE_PROVIDER_SITE_OTHER): Payer: Self-pay | Admitting: Family Medicine

## 2017-04-13 ENCOUNTER — Encounter: Payer: Self-pay | Admitting: Family Medicine

## 2017-04-13 DIAGNOSIS — F418 Other specified anxiety disorders: Secondary | ICD-10-CM

## 2017-04-13 MED ORDER — BUSPIRONE HCL 10 MG PO TABS: 10.0000 mg | ORAL_TABLET | Freq: Three times a day (TID) | ORAL | 2 refills | Status: DC

## 2017-04-13 MED ORDER — CITALOPRAM HYDROBROMIDE 40 MG PO TABS: 40.0000 mg | ORAL_TABLET | Freq: Every day | ORAL | 0 refills | Status: DC

## 2017-04-13 NOTE — Progress Notes (Signed)
Subjective:  I acted as a Education administrator for Dr. Charlett Myers. Autumn Myers, [place]  Patient ID: Autumn Myers, female    DOB: 10/20/1960, 56 y.o.   MRN: [id number]  No chief complaint on file.   HPI  Patient is in today for a follow up. She is struggling with increased anxiety and irritability recently secondary to stress at work and at home with a new partner. No danger but significant stress. Notes anhedonia but denies suicidal ideation. Denies CP/palp/SOB/HA/congestion/fevers/GI or GU c/o. Taking meds as prescribed.   Patient Care Team: Mosie Lukes, MD as PCP - General (Family Medicine)   Past Medical History:  Diagnosis Date  . Abnormal results of thyroid function studies 01/28/2013  . Allergic state 11/20/2011  . Allergy   . Anxiety   . Cancer ([place])    basal cell  . Chicken pox as a child  . Depression   . Depression with anxiety   . Headache(784.0) 11/20/2011  . Hematuria 01/29/2012  . Hip pain, right 01/29/2012  . Myalgia 11/20/2011  . Neck pain 05/13/2013  . Other and unspecified hyperlipidemia 05/13/2013  . Personal history of colonic adenoma 06/23/2013   06/23/2013 2 dminutive polyps    . Preventative health care 10/13/2011  . Puncture wound of thigh, right 02/03/2012  . Raynaud phenomenon 04/15/2014  . Renal lithiasis 01/29/2012  . Sinusitis acute 01/29/2012  . Sinusitis, acute 01/29/2012  . Smoker   . Vitamin D deficiency   . Wound infection 06/20/2012    Past Surgical History:  Procedure Laterality Date  . CARPAL TUNNEL RELEASE     on right and left  . CESAREAN SECTION  1994  . colon polyps     1 was precancerous  . LITHOTRIPSY      Family History  Problem Relation Age of Onset  . Hypertension Mother   . Parkinsonism Mother   . Stroke Father   . Hypertension Father   . Cancer Father        leukemia  . Colon polyps Father   . Depression Sister   . Anxiety disorder Sister   . Colon polyps Sister   . Heart disease Maternal Grandfather   . Colon polyps Brother   .  Colon cancer Neg Hx   . Stomach cancer Neg Hx   . Parkinsonism Maternal Grandmother     Social History   Social History  . Marital status: Legally Separated    Spouse name: N/A  . Number of children: N/A  . Years of education: N/A   Occupational History  . Not on file.   Social History Main Topics  . Smoking status: Current Every Day Smoker    Years: 15.00    Types: Cigarettes  . Smokeless tobacco: Never Used     Comment: less than a pack a day  . Alcohol use No  . Drug use: No  . Sexual activity: Not Currently    Partners: Male    Birth control/ protection: None   Other Topics Concern  . Not on file   Social History Narrative  . No narrative on file    Outpatient Medications Prior to Visit  Medication Sig Dispense Refill  . cholecalciferol (VITAMIN D) 1000 units tablet Take 1,000 Units by mouth daily.    . cyclobenzaprine (FLEXERIL) 10 MG tablet TAKE ONE TABLET BY MOUTH ONCE DAILY AT BEDTIME AS NEEDED FOR MUSCLE SPASM 30 tablet 2  . loratadine (CLARITIN) 10 MG tablet Take 10 mg by mouth daily.    Marland Kitchen  citalopram (CELEXA) 20 MG tablet TAKE TWO TABLETS BY MOUTH ONCE DAILY 60 tablet 5   No facility-administered medications prior to visit.     Allergies  Allergen Reactions  . Eggs Or Egg-Derived Products Nausea And Vomiting    cramping  . Neosporin [Neomycin-Bacitracin Zn-Polymyx] Itching    Inflamed, itchy   . Penicillins Hives  . Codeine     GI upset    Review of Systems  Constitutional: Positive for malaise/fatigue. Negative for fever.  HENT: Negative for congestion.   Eyes: Negative for blurred vision.  Respiratory: Negative for cough and shortness of breath.   Cardiovascular: Negative for chest pain, palpitations and leg swelling.  Gastrointestinal: Negative for vomiting.  Musculoskeletal: Negative for back pain.  Skin: Negative for rash.  Neurological: Negative for loss of consciousness and headaches.  Psychiatric/Behavioral: Positive for depression.  The patient is nervous/anxious.        Objective:    Physical Exam  Constitutional: She is oriented to person, place, and time. She appears well-developed and well-nourished. No distress.  HENT:  Head: Normocephalic and atraumatic.  Eyes: Conjunctivae are normal.  Neck: Normal range of motion. No thyromegaly present.  Cardiovascular: Normal rate and regular rhythm.   Pulmonary/Chest: Effort normal and breath sounds normal. She has no wheezes.  Abdominal: Soft. Bowel sounds are normal. There is no tenderness.  Musculoskeletal: Normal range of motion. She exhibits no edema or deformity.  Neurological: She is alert and oriented to person, place, and time.  Skin: Skin is warm and dry. She is not diaphoretic.  Psychiatric: She has a normal mood and affect.    BP 90/62 (BP Location: Left Arm, Patient Position: Sitting, Cuff Size: Normal)   Pulse 65   Temp 98.1 F (36.7 C) (Oral)   Resp 18   Ht 5\' 1"  (1.549 m)   Wt 94 lb 6.4 oz (42.8 kg)   LMP 11/16/2008   SpO2 98%   BMI 17.84 kg/m  Wt Readings from Last 3 Encounters:  04/13/17 94 lb 6.4 oz (42.8 kg)  09/22/16 102 lb 9.6 oz (46.5 kg)  05/07/16 103 lb 9.6 oz (47 kg)   BP Readings from Last 3 Encounters:  04/13/17 90/62  09/22/16 (!) 89/52  05/07/16 (!) 92/50     Immunization History  Administered Date(s) Administered  . Influenza Split 06/20/2012  . Influenza Whole 05/04/2011  . Influenza,inj,Quad PF,6+ Mos 05/08/2013  . Influenza-Unspecified 04/27/2014    Health Maintenance  Topic Date Due  . Hepatitis C Screening  1960-09-29  . HIV Screening  02/07/1977  . TETANUS/TDAP  02/07/1981  . MAMMOGRAM  07/08/2015  . PAP SMEAR  07/07/2016  . INFLUENZA VACCINE  03/03/2017  . COLONOSCOPY  06/23/2018    Lab Results  Component Value Date   WBC 9.0 09/22/2016   HGB 13.3 09/22/2016   HCT 38.9 09/22/2016   PLT 324.0 09/22/2016   GLUCOSE 96 09/22/2016   CHOL 197 05/08/2013   TRIG 258 (H) 05/08/2013   HDL 59 05/08/2013     LDLCALC 86 05/08/2013   ALT 8 09/22/2016   AST 14 09/22/2016   NA 142 09/22/2016   K 4.8 09/22/2016   CL 107 09/22/2016   CREATININE 1.00 09/22/2016   BUN 9 09/22/2016   CO2 32 09/22/2016   TSH 2.30 09/22/2016    Lab Results  Component Value Date   TSH 2.30 09/22/2016   Lab Results  Component Value Date   WBC 9.0 09/22/2016   HGB 13.3 09/22/2016  HCT 38.9 09/22/2016   MCV 92.5 09/22/2016   PLT 324.0 09/22/2016   Lab Results  Component Value Date   NA 142 09/22/2016   K 4.8 09/22/2016   CO2 32 09/22/2016   GLUCOSE 96 09/22/2016   BUN 9 09/22/2016   CREATININE 1.00 09/22/2016   BILITOT 0.3 09/22/2016   ALKPHOS 80 09/22/2016   AST 14 09/22/2016   ALT 8 09/22/2016   PROT 6.8 09/22/2016   ALBUMIN 4.5 09/22/2016   CALCIUM 9.9 09/22/2016   GFR 61.27 09/22/2016   Lab Results  Component Value Date   CHOL 197 05/08/2013   Lab Results  Component Value Date   HDL 59 05/08/2013   Lab Results  Component Value Date   LDLCALC 86 05/08/2013   Lab Results  Component Value Date   TRIG 258 (H) 05/08/2013   Lab Results  Component Value Date   CHOLHDL 3.3 05/08/2013   No results found for: HGBA1C       Assessment & Plan:   Problem List Items Addressed This Visit    Depression with anxiety    Has been under a great deal of stress lately at home and at work and she feels her anxiety is worsening. Will try Buspar bid and increase to tid as needed and as tolerated. Continue Citalopram.         I have discontinued Autumn Myers citalopram. I am also having her start on citalopram and busPIRone. Additionally, I am having her maintain her loratadine, cholecalciferol, and cyclobenzaprine.  Meds ordered this encounter  Medications  . citalopram (CELEXA) 40 MG tablet    Sig: Take 1 tablet (40 mg total) by mouth daily.    Dispense:  90 tablet    Refill:  0  . busPIRone (BUSPAR) 10 MG tablet    Sig: Take 1 tablet (10 mg total) by mouth 3 (three) times daily.     Dispense:  90 tablet    Refill:  2    CMA served as scribe during this visit. History, Physical and Plan performed by medical provider. Documentation and orders reviewed and attested to.  Penni Homans, MD

## 2017-04-13 NOTE — Patient Instructions (Signed)

## 2017-04-18 NOTE — Assessment & Plan Note (Signed)
Has been under a great deal of stress lately at home and at work and she feels her anxiety is worsening. Will try Buspar bid and increase to tid as needed and as tolerated. Continue Citalopram.

## 2017-07-13 ENCOUNTER — Telehealth: Payer: Self-pay | Admitting: Family Medicine

## 2017-07-13 ENCOUNTER — Ambulatory Visit: Payer: Self-pay | Admitting: Family Medicine

## 2017-07-13 MED ORDER — CITALOPRAM HYDROBROMIDE 40 MG PO TABS: 40.0000 mg | ORAL_TABLET | Freq: Every day | ORAL | 0 refills | Status: DC

## 2017-07-13 MED ORDER — BUSPIRONE HCL 10 MG PO TABS
10.0000 mg | ORAL_TABLET | Freq: Three times a day (TID) | ORAL | 0 refills | Status: DC
Start: 2017-07-13 — End: 2017-08-09

## 2017-07-13 NOTE — Telephone Encounter (Signed)
Copied from [place] [id number]. Topic: Quick Communication - Rx Refill/Question >> Jul 13, 2017  9:35 AM Robina Ade, Helene Kelp D wrote: Has the patient contacted their pharmacy? Yes (Agent: If no, request that the patient contact the pharmacy for the refill.) Preferred Pharmacy (with phone number or street name): [id number] - [place], [place] - [address] Agent: Please be advised that RX refills may take up to 3 business days. We ask that you follow-up with your pharmacy.  Patient is completley out of her medication and needs refill on her busPIRone (BUSPAR) 10 MG tablet, citalopram (CELEXA) 40 MG tablet. Patient had to reschedule appt due to weather and her next appt is in January.

## 2017-08-09 ENCOUNTER — Ambulatory Visit (INDEPENDENT_AMBULATORY_CARE_PROVIDER_SITE_OTHER)
Admission: RE | Admit: 2017-08-09 | Discharge: 2017-08-09 | Disposition: A | Discharge status: Home / Self Care | Payer: Self-pay | Source: Ambulatory Visit | Attending: Family Medicine | Admitting: Family Medicine

## 2017-08-09 ENCOUNTER — Ambulatory Visit (INDEPENDENT_AMBULATORY_CARE_PROVIDER_SITE_OTHER): Payer: Self-pay | Admitting: Family Medicine

## 2017-08-09 ENCOUNTER — Encounter: Payer: Self-pay | Admitting: Family Medicine

## 2017-08-09 VITALS — BP 110/68 | HR 72 | Temp 98.0°F | Resp 16 | Ht 61.02 in | Wt 97.4 lb

## 2017-08-09 DIAGNOSIS — Z86010 Personal history of colonic polyps: Secondary | ICD-10-CM

## 2017-08-09 DIAGNOSIS — N200 Calculus of kidney: Secondary | ICD-10-CM

## 2017-08-09 DIAGNOSIS — R1032 Left lower quadrant pain: Secondary | ICD-10-CM

## 2017-08-09 DIAGNOSIS — M545 Low back pain, unspecified: Secondary | ICD-10-CM | POA: Insufficient documentation

## 2017-08-09 DIAGNOSIS — M62838 Other muscle spasm: Secondary | ICD-10-CM

## 2017-08-09 MED ORDER — CITALOPRAM HYDROBROMIDE 40 MG PO TABS: 40.0000 mg | ORAL_TABLET | Freq: Every day | ORAL | 0 refills | Status: DC

## 2017-08-09 MED ORDER — METHYLPREDNISOLONE 4 MG PO TABS: ORAL_TABLET | ORAL | 0 refills | Status: DC

## 2017-08-09 MED ORDER — HYOSCYAMINE SULFATE 0.125 MG SL SUBL: 0.1250 mg | SUBLINGUAL_TABLET | SUBLINGUAL | 0 refills | Status: DC | PRN

## 2017-08-09 MED ORDER — BUSPIRONE HCL 10 MG PO TABS: 10.0000 mg | ORAL_TABLET | Freq: Three times a day (TID) | ORAL | 0 refills | Status: DC

## 2017-08-09 MED ORDER — CYCLOBENZAPRINE HCL 10 MG PO TABS: ORAL_TABLET | ORAL | 2 refills | Status: DC

## 2017-08-09 NOTE — Patient Instructions (Signed)

## 2017-08-09 NOTE — Assessment & Plan Note (Signed)
Check urinalysis. 

## 2017-08-09 NOTE — Assessment & Plan Note (Signed)
Refill given on Cyclobenzaprine to use prn and given a medrol dosepak to use if pain persists. Encouraged moist heat and gentle stretching as tolerated. May try NSAIDs and prescription meds as directed and report if symptoms worsen or seek immediate care. Try topical lidocaine

## 2017-08-09 NOTE — Assessment & Plan Note (Signed)
Intermittent and cramping. Try Hyoscyamine prn, check abdominal xray and labs report if symptoms worsen

## 2017-08-09 NOTE — Progress Notes (Signed)
Subjective:  I acted as a Education administrator for [employer]. Yancey Flemings, [place]   Patient ID: Autumn Myers, female    DOB: [birth year], 57 y.o.   MRN: [id number]  Chief Complaint  Patient presents with  . Follow-up    HPI  Patient is in today for follow up visit.  She is in today noting about a 3-week history of intermittent left lower quadrant pain.  She reports it comes in waves and is a spasm with some nausea and the pain makes her feel even as if it is hard to breathe.  She denies any nausea, vomiting, anorexia, incontinence or constipation.  She does note her stools are still daily but somewhat looser.  The pain does not seem to correlate with need for a bowel movement.  She also was having some low back pain and does do a lot of heavy lifting in her work.  She denies any incontinence or radicular symptoms she had no falls or trauma. Denies CP/palp/SOB/HA/congestion/fevers or GU c/o. Taking meds as prescribed.   Patient Care Team: Mosie Lukes, MD as PCP - General (Family Medicine)   Past Medical History:  Diagnosis Date  . Abnormal results of thyroid function studies 01/28/2013  . Allergic state 11/20/2011  . Allergy   . Anxiety   . Cancer ([place])    basal cell  . Chicken pox as a child  . Depression   . Depression with anxiety   . Headache(784.0) 11/20/2011  . Hematuria 01/29/2012  . Hip pain, right 01/29/2012  . Myalgia 11/20/2011  . Neck pain 05/13/2013  . Other and unspecified hyperlipidemia 05/13/2013  . Personal history of colonic adenoma 06/23/2013   06/23/2013 2 dminutive polyps    . Preventative health care 10/13/2011  . Puncture wound of thigh, right 02/03/2012  . Raynaud phenomenon 04/15/2014  . Renal lithiasis 01/29/2012  . Sinusitis acute 01/29/2012  . Sinusitis, acute 01/29/2012  . Smoker   . Vitamin D deficiency   . Wound infection 06/20/2012    Past Surgical History:  Procedure Laterality Date  . CARPAL TUNNEL RELEASE     on right and left  . CESAREAN SECTION  1994  .  colon polyps     1 was precancerous  . LITHOTRIPSY      Family History  Problem Relation Age of Onset  . Hypertension Mother   . Parkinsonism Mother   . Stroke Father   . Hypertension Father   . Cancer Father        leukemia  . Colon polyps Father   . Depression Sister   . Anxiety disorder Sister   . Colon polyps Sister   . Heart disease Maternal Grandfather   . Colon polyps Brother   . Colon cancer Neg Hx   . Stomach cancer Neg Hx   . Parkinsonism Maternal Grandmother     Social History   Socioeconomic History  . Marital status: Legally Separated    Spouse name: Not on file  . Number of children: Not on file  . Years of education: Not on file  . Highest education level: Not on file  Social Needs  . Financial resource strain: Not on file  . Food insecurity - worry: Not on file  . Food insecurity - inability: Not on file  . Transportation needs - medical: Not on file  . Transportation needs - non-medical: Not on file  Occupational History  . Not on file  Tobacco Use  . Smoking status: Current Every Day  Smoker    Years: 15.00    Types: Cigarettes  . Smokeless tobacco: Never Used  . Tobacco comment: less than a pack a day  Substance and Sexual Activity  . Alcohol use: No  . Drug use: No  . Sexual activity: Not Currently    Partners: Male    Birth control/protection: None  Other Topics Concern  . Not on file  Social History Narrative  . Not on file    Outpatient Medications Prior to Visit  Medication Sig Dispense Refill  . cholecalciferol (VITAMIN D) 1000 units tablet Take 1,000 Units by mouth daily.    Marland Kitchen loratadine (CLARITIN) 10 MG tablet Take 10 mg by mouth daily.    . busPIRone (BUSPAR) 10 MG tablet Take 1 tablet (10 mg total) by mouth 3 (three) times daily. 30 tablet 0  . citalopram (CELEXA) 40 MG tablet Take 1 tablet (40 mg total) by mouth daily. 30 tablet 0  . cyclobenzaprine (FLEXERIL) 10 MG tablet TAKE ONE TABLET BY MOUTH ONCE DAILY AT BEDTIME AS  NEEDED FOR MUSCLE SPASM 30 tablet 2   No facility-administered medications prior to visit.     Allergies  Allergen Reactions  . Eggs Or Egg-Derived Products Nausea And Vomiting    cramping  . Neosporin [Neomycin-Bacitracin Zn-Polymyx] Itching    Inflamed, itchy   . Penicillins Hives  . Codeine     GI upset    Review of Systems  Constitutional: Negative for fever and malaise/fatigue.  HENT: Negative for congestion.   Eyes: Negative for blurred vision.  Respiratory: Negative for shortness of breath.   Cardiovascular: Negative for chest pain, palpitations and leg swelling.  Gastrointestinal: Positive for abdominal pain. Negative for blood in stool, constipation, heartburn, nausea and vomiting.  Genitourinary: Negative for dysuria and frequency.  Musculoskeletal: Positive for back pain. Negative for falls and myalgias.  Skin: Negative for rash.  Neurological: Negative for dizziness, loss of consciousness and headaches.  Endo/Heme/Allergies: Negative for environmental allergies.  Psychiatric/Behavioral: Negative for depression. The patient is not nervous/anxious.        Objective:    Physical Exam  BP 110/68 (BP Location: Left Arm, Patient Position: Sitting, Cuff Size: Normal)   Pulse 72   Temp 98 F (36.7 C) (Oral)   Resp 16   Ht 5' 1.02" (1.55 m)   Wt 97 lb 6.4 oz (44.2 kg)   LMP 11/16/2008   SpO2 98%   BMI 18.39 kg/m  Wt Readings from Last 3 Encounters:  08/09/17 97 lb 6.4 oz (44.2 kg)  04/13/17 94 lb 6.4 oz (42.8 kg)  09/22/16 102 lb 9.6 oz (46.5 kg)   BP Readings from Last 3 Encounters:  08/09/17 110/68  04/13/17 90/62  09/22/16 (!) 89/52     Immunization History  Administered Date(s) Administered  . Influenza Split 06/20/2012  . Influenza Whole 05/04/2011  . Influenza,inj,Quad PF,6+ Mos 05/08/2013  . Influenza-Unspecified 04/27/2014    Health Maintenance  Topic Date Due  . Hepatitis C Screening  1960/08/18  . HIV Screening  02/07/1977  .  TETANUS/TDAP  02/07/1981  . MAMMOGRAM  07/08/2015  . PAP SMEAR  07/07/2016  . INFLUENZA VACCINE  03/03/2017  . COLONOSCOPY  06/23/2018    Lab Results  Component Value Date   WBC 9.0 09/22/2016   HGB 13.3 09/22/2016   HCT 38.9 09/22/2016   PLT 324.0 09/22/2016   GLUCOSE 96 09/22/2016   CHOL 197 05/08/2013   TRIG 258 (H) 05/08/2013   HDL 59 05/08/2013  LDLCALC 86 05/08/2013   ALT 8 09/22/2016   AST 14 09/22/2016   NA 142 09/22/2016   K 4.8 09/22/2016   CL 107 09/22/2016   CREATININE 1.00 09/22/2016   BUN 9 09/22/2016   CO2 32 09/22/2016   TSH 2.30 09/22/2016    Lab Results  Component Value Date   TSH 2.30 09/22/2016   Lab Results  Component Value Date   WBC 9.0 09/22/2016   HGB 13.3 09/22/2016   HCT 38.9 09/22/2016   MCV 92.5 09/22/2016   PLT 324.0 09/22/2016   Lab Results  Component Value Date   NA 142 09/22/2016   K 4.8 09/22/2016   CO2 32 09/22/2016   GLUCOSE 96 09/22/2016   BUN 9 09/22/2016   CREATININE 1.00 09/22/2016   BILITOT 0.3 09/22/2016   ALKPHOS 80 09/22/2016   AST 14 09/22/2016   ALT 8 09/22/2016   PROT 6.8 09/22/2016   ALBUMIN 4.5 09/22/2016   CALCIUM 9.9 09/22/2016   GFR 61.27 09/22/2016   Lab Results  Component Value Date   CHOL 197 05/08/2013   Lab Results  Component Value Date   HDL 59 05/08/2013   Lab Results  Component Value Date   LDLCALC 86 05/08/2013   Lab Results  Component Value Date   TRIG 258 (H) 05/08/2013   Lab Results  Component Value Date   CHOLHDL 3.3 05/08/2013   No results found for: HGBA1C       Assessment & Plan:   Problem List Items Addressed This Visit    Renal lithiasis    Check urinalysis      Personal history of colonic adenoma - Primary   LLQ pain    Intermittent and cramping. Try Hyoscyamine prn, check abdominal xray and labs report if symptoms worsen      Low back pain    Refill given on Cyclobenzaprine to use prn and given a medrol dosepak to use if pain persists. Encouraged  moist heat and gentle stretching as tolerated. May try NSAIDs and prescription meds as directed and report if symptoms worsen or seek immediate care. Try topical lidocaine      Relevant Medications   cyclobenzaprine (FLEXERIL) 10 MG tablet   methylPREDNISolone (MEDROL) 4 MG tablet    Other Visit Diagnoses    Muscle spasm       Relevant Medications   cyclobenzaprine (FLEXERIL) 10 MG tablet   Left lower quadrant pain       Relevant Orders   CBC with Differential/Platelet   Basic Metabolic Panel (BMET)   Urinalysis   DG Abd 2 Views      I am having Autumn Myers start on methylPREDNISolone and hyoscyamine. I am also having her maintain her loratadine, cholecalciferol, citalopram, cyclobenzaprine, and busPIRone.  Meds ordered this encounter  Medications  . citalopram (CELEXA) 40 MG tablet    Sig: Take 1 tablet (40 mg total) by mouth daily.    Dispense:  30 tablet    Refill:  0  . cyclobenzaprine (FLEXERIL) 10 MG tablet    Sig: TAKE ONE TABLET BY MOUTH ONCE DAILY AT BEDTIME AS NEEDED FOR MUSCLE SPASM    Dispense:  30 tablet    Refill:  2  . busPIRone (BUSPAR) 10 MG tablet    Sig: Take 1 tablet (10 mg total) by mouth 3 (three) times daily.    Dispense:  30 tablet    Refill:  0  . methylPREDNISolone (MEDROL) 4 MG tablet    Sig: 5  tab po qd X 1d then 4 tab po qd X 1d then 3 tab po qd X 1d then 2 tab po qd then 1 tab po qd    Dispense:  15 tablet    Refill:  0  . hyoscyamine (LEVSIN SL) 0.125 MG SL tablet    Sig: Place 1 tablet (0.125 mg total) under the tongue every 4 (four) hours as needed.    Dispense:  30 tablet    Refill:  0    CMA served as scribe during this visit. History, Physical and Plan performed by medical provider. Documentation and orders reviewed and attested to.  Penni Homans, MD

## 2017-08-10 LAB — URINALYSIS
Bilirubin Urine: NEGATIVE
Hgb urine dipstick: NEGATIVE
Ketones, ur: NEGATIVE
Leukocytes, UA: NEGATIVE
NITRITE: NEGATIVE
PH: 6.5 (ref 5.0–8.0)
TOTAL PROTEIN, URINE-UPE24: NEGATIVE
Urine Glucose: NEGATIVE
Urobilinogen, UA: 0.2 (ref 0.0–1.0)

## 2017-08-10 LAB — CBC WITH DIFFERENTIAL/PLATELET
BASOS ABS: 0.1 10*3/uL (ref 0.0–0.1)
BASOS PCT: 1 % (ref 0.0–3.0)
EOS ABS: 0.2 10*3/uL (ref 0.0–0.7)
Eosinophils Relative: 1.9 % (ref 0.0–5.0)
HCT: 39.5 % (ref 36.0–46.0)
HEMOGLOBIN: 13.4 g/dL (ref 12.0–15.0)
LYMPHS PCT: 35 % (ref 12.0–46.0)
Lymphs Abs: 3.1 10*3/uL (ref 0.7–4.0)
MCHC: 33.9 g/dL (ref 30.0–36.0)
MCV: 92.9 fl (ref 78.0–100.0)
Monocytes Absolute: 0.5 10*3/uL (ref 0.1–1.0)
Monocytes Relative: 5.6 % (ref 3.0–12.0)
Neutro Abs: 5.1 10*3/uL (ref 1.4–7.7)
Neutrophils Relative %: 56.5 % (ref 43.0–77.0)
Platelets: 353 10*3/uL (ref 150.0–400.0)
RBC: 4.25 Mil/uL (ref 3.87–5.11)
RDW: 12.1 % (ref 11.5–15.5)
WBC: 9 10*3/uL (ref 4.0–10.5)

## 2017-08-10 LAB — BASIC METABOLIC PANEL
BUN: 7 mg/dL (ref 6–23)
CHLORIDE: 104 meq/L (ref 96–112)
CO2: 31 mEq/L (ref 19–32)
Calcium: 9.2 mg/dL (ref 8.4–10.5)
Creatinine, Ser: 0.84 mg/dL (ref 0.40–1.20)
GFR: 74.68 mL/min (ref >60.00)
GLUCOSE: 87 mg/dL (ref 70–99)
POTASSIUM: 3.9 meq/L (ref 3.5–5.1)
Sodium: 139 mEq/L (ref 135–145)

## 2017-09-09 ENCOUNTER — Telehealth: Payer: Self-pay | Admitting: Family Medicine

## 2017-09-09 NOTE — Telephone Encounter (Signed)
Copied from [place]. Topic: Quick Communication - Rx Refill/Question >> Sep 09, 2017 11:23 AM Ivar Drape wrote: Medication:  citalopram (CELEXA) 40 MG tablet   Has the patient contacted their pharmacy? NO, because she wants a 90 day supply.   (Agent: If no, request that the patient contact the pharmacy for the refill.)   Preferred Pharmacy (with phone number or street name):  [facility] in [place], [place] on [address] [phone]   Agent: Please be advised that RX refills may take up to 3 business days. We ask that you follow-up with your pharmacy.

## 2017-09-11 ENCOUNTER — Other Ambulatory Visit: Payer: Self-pay | Admitting: Family Medicine

## 2017-09-13 MED ORDER — CITALOPRAM HYDROBROMIDE 40 MG PO TABS: 40.0000 mg | ORAL_TABLET | Freq: Every day | ORAL | 1 refills | Status: DC

## 2017-09-13 NOTE — Telephone Encounter (Signed)
Medication sent   Patient notified  

## 2017-09-13 NOTE — Addendum Note (Signed)
Addended by: Magdalene Molly A on: 09/13/2017 01:08 PM   Modules accepted: Orders

## 2017-09-13 NOTE — Telephone Encounter (Signed)
I am not sure this message was ever routed to our office but I just spoke with this patient on the phone and she is requesting a 90 supply of her Citalopram be sent to the pharmacy. A 90 day supply is cheaper and more convenient for her rather than picking it up monthly.

## 2017-10-19 ENCOUNTER — Other Ambulatory Visit: Payer: Self-pay | Admitting: Family Medicine

## 2017-11-16 ENCOUNTER — Other Ambulatory Visit: Payer: Self-pay | Admitting: Family Medicine

## 2017-12-06 ENCOUNTER — Other Ambulatory Visit: Payer: Self-pay | Admitting: Family Medicine

## 2018-01-03 ENCOUNTER — Other Ambulatory Visit: Payer: Self-pay | Admitting: Family Medicine

## 2018-02-01 ENCOUNTER — Other Ambulatory Visit: Payer: Self-pay | Admitting: Family Medicine

## 2018-03-02 ENCOUNTER — Other Ambulatory Visit: Payer: Self-pay | Admitting: Family Medicine

## 2018-03-22 ENCOUNTER — Other Ambulatory Visit: Payer: Self-pay | Admitting: Family Medicine

## 2018-04-21 ENCOUNTER — Other Ambulatory Visit: Payer: Self-pay | Admitting: Family Medicine

## 2018-05-20 ENCOUNTER — Other Ambulatory Visit: Payer: Self-pay | Admitting: Family Medicine

## 2018-06-22 ENCOUNTER — Other Ambulatory Visit: Payer: Self-pay | Admitting: Family Medicine

## 2018-07-22 ENCOUNTER — Other Ambulatory Visit: Payer: Self-pay | Admitting: Family Medicine

## 2018-08-23 ENCOUNTER — Other Ambulatory Visit: Payer: Self-pay | Admitting: Family Medicine

## 2018-09-18 ENCOUNTER — Encounter: Payer: Self-pay | Admitting: Internal Medicine

## 2018-09-22 ENCOUNTER — Other Ambulatory Visit: Payer: Self-pay | Admitting: Family Medicine

## 2018-10-06 ENCOUNTER — Encounter: Payer: Self-pay | Admitting: Internal Medicine

## 2018-10-11 ENCOUNTER — Encounter: Payer: Self-pay | Admitting: Internal Medicine

## 2018-10-18 ENCOUNTER — Other Ambulatory Visit: Payer: Self-pay | Admitting: Family Medicine

## 2018-11-17 ENCOUNTER — Other Ambulatory Visit: Payer: Self-pay | Admitting: Family Medicine

## 2018-12-19 ENCOUNTER — Ambulatory Visit (INDEPENDENT_AMBULATORY_CARE_PROVIDER_SITE_OTHER): Payer: Self-pay | Admitting: Family Medicine

## 2018-12-19 ENCOUNTER — Other Ambulatory Visit: Payer: Self-pay

## 2018-12-19 ENCOUNTER — Encounter: Payer: Self-pay | Admitting: Family Medicine

## 2018-12-19 DIAGNOSIS — F17200 Nicotine dependence, unspecified, uncomplicated: Secondary | ICD-10-CM

## 2018-12-19 DIAGNOSIS — M62838 Other muscle spasm: Secondary | ICD-10-CM

## 2018-12-19 DIAGNOSIS — E559 Vitamin D deficiency, unspecified: Secondary | ICD-10-CM

## 2018-12-19 DIAGNOSIS — F418 Other specified anxiety disorders: Secondary | ICD-10-CM

## 2018-12-19 MED ORDER — BUSPIRONE HCL 10 MG PO TABS: 10.0000 mg | ORAL_TABLET | Freq: Three times a day (TID) | ORAL | 1 refills | Status: DC

## 2018-12-19 MED ORDER — CYCLOBENZAPRINE HCL 10 MG PO TABS: ORAL_TABLET | ORAL | 1 refills | Status: DC

## 2018-12-19 MED ORDER — CITALOPRAM HYDROBROMIDE 40 MG PO TABS: 40.0000 mg | ORAL_TABLET | Freq: Every day | ORAL | 1 refills | Status: DC

## 2018-12-19 NOTE — Assessment & Plan Note (Signed)
Has added daily Vitamin D supplements will continue to monitor

## 2018-12-19 NOTE — Progress Notes (Signed)
Virtual Visit via Video Note  I connected with [name] on 12/19/18 at  8:40 AM EDT by a video enabled telemedicine application and verified that I am speaking with the correct person using two identifiers.  Location: Patient: home Provider: home   I discussed the limitations of evaluation and management by telemedicine and the availability of in person appointments. The patient expressed understanding and agreed to proceed. Alinda Dooms, CMA was able to get patient set up on video platform    Subjective:    Patient ID: Autumn Myers, female    DOB: [birth year], 58 y.o.   MRN: [id number]  Chief Complaint  Patient presents with  . Depression    PHQ- 9 score 7  . Anxiety    GAD 7 score 15  . Hyperlipidemia  . Follow-up    HPI Patient is in today for follow up on chronic medical concerns including depression, anxiety, cigarette use and more. She reports she had been doing very well until the pandemic hit and now her anxiety and insomnia have kicked up some. She feels her Citalopram is working well enough but she is some more anxious. She reports otherwise her health is well. No recent febrile illness or hospitalizations. Denies CP/palp/SOB/HA/congestion/fevers/GI or GU c/o. Taking meds as prescribed. Unfortunately she continues to smoke about 1/2 ppd.  Past Medical History:  Diagnosis Date  . Abnormal results of thyroid function studies 01/28/2013  . Allergic state 11/20/2011  . Allergy   . Anxiety   . Cancer ([place])    basal cell  . Chicken pox as a child  . Depression   . Depression with anxiety   . Headache(784.0) 11/20/2011  . Hematuria 01/29/2012  . Hip pain, right 01/29/2012  . Myalgia 11/20/2011  . Neck pain 05/13/2013  . Other and unspecified hyperlipidemia 05/13/2013  . Personal history of colonic adenoma 06/23/2013   06/23/2013 2 dminutive polyps    . Preventative health care 10/13/2011  . Puncture wound of thigh, right 02/03/2012  . Raynaud phenomenon  04/15/2014  . Renal lithiasis 01/29/2012  . Sinusitis acute 01/29/2012  . Sinusitis, acute 01/29/2012  . Smoker   . Vitamin D deficiency   . Wound infection 06/20/2012    Past Surgical History:  Procedure Laterality Date  . CARPAL TUNNEL RELEASE     on right and left  . CESAREAN SECTION  1994  . COLONOSCOPY W/ POLYPECTOMY  2014   1 was precancerous  . LITHOTRIPSY      Family History  Problem Relation Age of Onset  . Hypertension Mother   . Parkinsonism Mother   . Stroke Father   . Hypertension Father   . Cancer Father        leukemia  . Colon polyps Father   . Depression Sister   . Anxiety disorder Sister   . Colon polyps Sister   . Heart disease Maternal Grandfather   . Colon polyps Brother   . Colon cancer Neg Hx   . Stomach cancer Neg Hx   . Parkinsonism Maternal Grandmother     Social History   Socioeconomic History  . Marital status: Legally Separated    Spouse name: Not on file  . Number of children: Not on file  . Years of education: Not on file  . Highest education level: Not on file  Occupational History  . Not on file  Social Needs  . Financial resource strain: Not on file  . Food insecurity:    Worry: Not  on file    Inability: Not on file  . Transportation needs:    Medical: Not on file    Non-medical: Not on file  Tobacco Use  . Smoking status: Current Every Day Smoker    Years: 15.00    Types: Cigarettes  . Smokeless tobacco: Never Used  . Tobacco comment: less than a pack a day  Substance and Sexual Activity  . Alcohol use: No  . Drug use: No  . Sexual activity: Not Currently    Partners: Male    Birth control/protection: None  Lifestyle  . Physical activity:    Days per week: Not on file    Minutes per session: Not on file  . Stress: Not on file  Relationships  . Social connections:    Talks on phone: Not on file    Gets together: Not on file    Attends religious service: Not on file    Active member of club or organization: Not  on file    Attends meetings of clubs or organizations: Not on file    Relationship status: Not on file  . Intimate partner violence:    Fear of current or ex partner: Not on file    Emotionally abused: Not on file    Physically abused: Not on file    Forced sexual activity: Not on file  Other Topics Concern  . Not on file  Social History Narrative  . Not on file    Outpatient Medications Prior to Visit  Medication Sig Dispense Refill  . cholecalciferol (VITAMIN D) 1000 units tablet Take 1,000 Units by mouth daily.    Marland Kitchen loratadine (CLARITIN) 10 MG tablet Take 10 mg by mouth daily.    . busPIRone (BUSPAR) 10 MG tablet TAKE 1 TABLET BY MOUTH THREE TIMES DAILY 90 tablet 0  . citalopram (CELEXA) 40 MG tablet Take 1 tablet (40 mg total) by mouth daily. 90 tablet 1  . citalopram (CELEXA) 40 MG tablet Take 1 tablet by mouth once daily 30 tablet 0  . cyclobenzaprine (FLEXERIL) 10 MG tablet TAKE ONE TABLET BY MOUTH ONCE DAILY AT BEDTIME AS NEEDED FOR MUSCLE SPASM 30 tablet 2  . hyoscyamine (LEVSIN SL) 0.125 MG SL tablet Place 1 tablet (0.125 mg total) under the tongue every 4 (four) hours as needed. (Patient not taking: Reported on 12/19/2018) 30 tablet 0  . methylPREDNISolone (MEDROL) 4 MG tablet 5 tab po qd X 1d then 4 tab po qd X 1d then 3 tab po qd X 1d then 2 tab po qd then 1 tab po qd (Patient not taking: Reported on 12/19/2018) 15 tablet 0   No facility-administered medications prior to visit.     Allergies  Allergen Reactions  . Eggs Or Egg-Derived Products Nausea And Vomiting    cramping  . Neosporin [Neomycin-Bacitracin Zn-Polymyx] Itching    Inflamed, itchy   . Penicillins Hives  . Codeine     GI upset    Review of Systems  Constitutional: Negative for fever and malaise/fatigue.  HENT: Negative for congestion.   Eyes: Negative for blurred vision.  Respiratory: Negative for shortness of breath.   Cardiovascular: Negative for chest pain, palpitations and leg swelling.   Gastrointestinal: Negative for abdominal pain, blood in stool and nausea.  Genitourinary: Negative for dysuria and frequency.  Musculoskeletal: Negative for falls.  Skin: Negative for rash.  Neurological: Negative for dizziness, loss of consciousness and headaches.  Endo/Heme/Allergies: Negative for environmental allergies.  Psychiatric/Behavioral: Negative for depression. The  patient is nervous/anxious and has insomnia.        Objective:    Physical Exam Constitutional:      Appearance: Normal appearance. She is not ill-appearing.  HENT:     Head: Normocephalic and atraumatic.     Nose: Nose normal.  Pulmonary:     Effort: Pulmonary effort is normal.  Neurological:     Mental Status: She is alert and oriented to person, place, and time.  Psychiatric:        Mood and Affect: Mood normal.        Behavior: Behavior normal.     Ht 5' 1.5" (1.562 m)   Wt 110 lb (49.9 kg)   LMP 11/16/2008   BMI 20.45 kg/m  Wt Readings from Last 3 Encounters:  12/19/18 110 lb (49.9 kg)  08/09/17 97 lb 6.4 oz (44.2 kg)  04/13/17 94 lb 6.4 oz (42.8 kg)    Diabetic Foot Exam - Simple   No data filed     Lab Results  Component Value Date   WBC 9.0 08/09/2017   HGB 13.4 08/09/2017   HCT 39.5 08/09/2017   PLT 353.0 08/09/2017   GLUCOSE 87 08/09/2017   CHOL 197 05/08/2013   TRIG 258 (H) 05/08/2013   HDL 59 05/08/2013   LDLCALC 86 05/08/2013   ALT 8 09/22/2016   AST 14 09/22/2016   NA 139 08/09/2017   K 3.9 08/09/2017   CL 104 08/09/2017   CREATININE 0.84 08/09/2017   BUN 7 08/09/2017   CO2 31 08/09/2017   TSH 2.30 09/22/2016    Lab Results  Component Value Date   TSH 2.30 09/22/2016   Lab Results  Component Value Date   WBC 9.0 08/09/2017   HGB 13.4 08/09/2017   HCT 39.5 08/09/2017   MCV 92.9 08/09/2017   PLT 353.0 08/09/2017   Lab Results  Component Value Date   NA 139 08/09/2017   K 3.9 08/09/2017   CO2 31 08/09/2017   GLUCOSE 87 08/09/2017   BUN 7 08/09/2017    CREATININE 0.84 08/09/2017   BILITOT 0.3 09/22/2016   ALKPHOS 80 09/22/2016   AST 14 09/22/2016   ALT 8 09/22/2016   PROT 6.8 09/22/2016   ALBUMIN 4.5 09/22/2016   CALCIUM 9.2 08/09/2017   GFR 74.68 08/09/2017   Lab Results  Component Value Date   CHOL 197 05/08/2013   Lab Results  Component Value Date   HDL 59 05/08/2013   Lab Results  Component Value Date   LDLCALC 86 05/08/2013   Lab Results  Component Value Date   TRIG 258 (H) 05/08/2013   Lab Results  Component Value Date   CHOLHDL 3.3 05/08/2013   No results found for: HGBA1C     Assessment & Plan:   Problem List Items Addressed This Visit    Depression with anxiety    She had been doing very well til Covid hit and was only taking Buspar in am. She feels the dose of Citalopram has been adequate but she is going to up her Buspar to tid for now.       Relevant Medications   busPIRone (BUSPAR) 10 MG tablet   citalopram (CELEXA) 40 MG tablet   Vitamin D deficiency    Has added daily Vitamin D supplements will continue to monitor      Smoker    Smoking about a 1/2 ppd. Is encouraged to attempt complete cessation to protect herself from a bad outcome from [place] should  she fall ill.       Other Visit Diagnoses    Muscle spasm       Relevant Medications   cyclobenzaprine (FLEXERIL) 10 MG tablet      I have discontinued Alandria P. Liskey's methylPREDNISolone, hyoscyamine, and citalopram. I have also changed her busPIRone and citalopram. Additionally, I am having her maintain her loratadine, cholecalciferol, and cyclobenzaprine.  Meds ordered this encounter  Medications  . busPIRone (BUSPAR) 10 MG tablet    Sig: Take 1 tablet (10 mg total) by mouth 3 (three) times daily.    Dispense:  270 tablet    Refill:  1  . cyclobenzaprine (FLEXERIL) 10 MG tablet    Sig: TAKE ONE TABLET BY MOUTH ONCE DAILY AT BEDTIME AS NEEDED FOR MUSCLE SPASM    Dispense:  90 tablet    Refill:  1  . citalopram (CELEXA) 40 MG  tablet    Sig: Take 1 tablet (40 mg total) by mouth daily.    Dispense:  90 tablet    Refill:  1   I discussed the assessment and treatment plan with the patient. The patient was provided an opportunity to ask questions and all were answered. The patient agreed with the plan and demonstrated an understanding of the instructions.   The patient was advised to call back or seek an in-person evaluation if the symptoms worsen or if the condition fails to improve as anticipated.  I provided 15 minutes of non-face-to-face time during this encounter.   Penni Homans, MD

## 2018-12-19 NOTE — Assessment & Plan Note (Signed)
She had been doing very well til Covid hit and was only taking Buspar in am. She feels the dose of Citalopram has been adequate but she is going to up her Buspar to tid for now.

## 2018-12-19 NOTE — Assessment & Plan Note (Signed)
Smoking about a 1/2 ppd. Is encouraged to attempt complete cessation to protect herself from a bad outcome from [place] should she fall ill.

## 2018-12-20 ENCOUNTER — Ambulatory Visit: Payer: Self-pay | Admitting: Family Medicine

## 2019-03-20 ENCOUNTER — Ambulatory Visit: Payer: Self-pay | Admitting: Family Medicine

## 2019-06-16 ENCOUNTER — Other Ambulatory Visit: Payer: Self-pay

## 2019-06-16 ENCOUNTER — Ambulatory Visit: Payer: Self-pay | Admitting: *Deleted

## 2019-06-16 ENCOUNTER — Ambulatory Visit (INDEPENDENT_AMBULATORY_CARE_PROVIDER_SITE_OTHER): Payer: Self-pay | Admitting: Family Medicine

## 2019-06-16 ENCOUNTER — Encounter: Payer: Self-pay | Admitting: Family Medicine

## 2019-06-16 VITALS — BP 108/82 | HR 70 | Temp 97.1°F | Ht 61.0 in | Wt 111.0 lb

## 2019-06-16 DIAGNOSIS — R42 Dizziness and giddiness: Secondary | ICD-10-CM

## 2019-06-16 LAB — BASIC METABOLIC PANEL
BUN: 10 mg/dL (ref 6–23)
CO2: 28 mEq/L (ref 19–32)
Calcium: 10 mg/dL (ref 8.4–10.5)
Chloride: 105 mEq/L (ref 96–112)
Creatinine, Ser: 0.88 mg/dL (ref 0.40–1.20)
GFR: 66.15 mL/min (ref >60.00)
Glucose, Bld: 78 mg/dL (ref 70–99)
Potassium: 4.1 mEq/L (ref 3.5–5.1)
Sodium: 142 mEq/L (ref 135–145)

## 2019-06-16 LAB — CBC
HCT: 41.9 % (ref 36.0–46.0)
Hemoglobin: 14 g/dL (ref 12.0–15.0)
MCHC: 33.5 g/dL (ref 30.0–36.0)
MCV: 92 fl (ref 78.0–100.0)
Platelets: 365 10*3/uL (ref 150.0–400.0)
RBC: 4.55 Mil/uL (ref 3.87–5.11)
RDW: 12.5 % (ref 11.5–15.5)
WBC: 8 10*3/uL (ref 4.0–10.5)

## 2019-06-16 LAB — TSH: TSH: 3.87 u[IU]/mL (ref 0.35–4.50)

## 2019-06-16 NOTE — Patient Instructions (Addendum)
Give [place] 2-3 business days to get the results of your labs back.   Stay hydrated. Try to get 60 oz of water daily.  If this happens again, please let me know. If it is constant, seek immediate care.  Let us know if you need anything.

## 2019-06-16 NOTE — Progress Notes (Signed)
Chief Complaint  Patient presents with  . Dizziness    KONNER SHORES is 58 y.o. pt here for dizziness.  Duration: 12 d Pass out? No Spinning? Yes  Triggers- none Lasts for around 10 min.  Palpitations? No Recent illness/fever? No Headache? No Neurologic signs? Tingling in extremities sometimes Change in PO intake? No  Has not tried anything at home. Laying down with feet up seems to help.   ROS:  Neuro: As noted in HPI Eyes: No vision changes  Past Medical History:  Diagnosis Date  . Abnormal results of thyroid function studies 01/28/2013  . Allergic state 11/20/2011  . Allergy   . Anxiety   . Cancer ([place])    basal cell  . Chicken pox as a child  . Depression   . Depression with anxiety   . Headache(784.0) 11/20/2011  . Hematuria 01/29/2012  . Hip pain, right 01/29/2012  . Myalgia 11/20/2011  . Neck pain 05/13/2013  . Other and unspecified hyperlipidemia 05/13/2013  . Personal history of colonic adenoma 06/23/2013   06/23/2013 2 dminutive polyps    . Preventative health care 10/13/2011  . Puncture wound of thigh, right 02/03/2012  . Raynaud phenomenon 04/15/2014  . Renal lithiasis 01/29/2012  . Sinusitis acute 01/29/2012  . Sinusitis, acute 01/29/2012  . Smoker   . Vitamin D deficiency   . Wound infection 06/20/2012    Family History  Problem Relation Age of Onset  . Hypertension Mother   . Parkinsonism Mother   . Stroke Father   . Hypertension Father   . Cancer Father        leukemia  . Colon polyps Father   . Depression Sister   . Anxiety disorder Sister   . Colon polyps Sister   . Heart disease Maternal Grandfather   . Colon polyps Brother   . Colon cancer Neg Hx   . Stomach cancer Neg Hx   . Parkinsonism Maternal Grandmother     Allergies as of 06/16/2019      Reactions   Eggs Or Egg-derived Products Nausea And Vomiting   cramping   Neosporin [neomycin-bacitracin Zn-polymyx] Itching   Inflamed, itchy    Penicillins Hives   Codeine    GI upset       Medication List       Accurate as of June 16, 2019  2:46 PM. If you have any questions, ask your nurse or doctor.        busPIRone 10 MG tablet Commonly known as: BUSPAR Take 1 tablet (10 mg total) by mouth 3 (three) times daily.   cholecalciferol 1000 units tablet Commonly known as: VITAMIN D Take 1,000 Units by mouth daily.   citalopram 40 MG tablet Commonly known as: CELEXA Take 1 tablet (40 mg total) by mouth daily.   cyclobenzaprine 10 MG tablet Commonly known as: FLEXERIL TAKE ONE TABLET BY MOUTH ONCE DAILY AT BEDTIME AS NEEDED FOR MUSCLE SPASM   loratadine 10 MG tablet Commonly known as: CLARITIN Take 10 mg by mouth daily.       BP 108/82 (BP Location: Left Arm, Patient Position: Sitting, Cuff Size: Normal)   Pulse 70   Temp (!) 97.1 F (36.2 C) (Temporal)   Ht 5\' 1"  (1.549 m)   Wt 111 lb (50.3 kg)   LMP 11/16/2008   SpO2 97%   BMI 20.97 kg/m  General: Awake, alert, appears stated age Eyes: PERRLA, EOMi Ears: Patent, TM's neg b/l Heart: RRR, no murmurs, no carotid bruits Lungs:  CTAB, no accessory muscle use MSK: 5/5 strength throughout, gait normal Neuro: No cerebellar signs, patellar reflex 3/4 b/l wo clonus, biceps reflex 2/4 b/l wo clonus; Dix-Hall-Pike negative bilaterally. Psych: Age appropriate judgment and insight, normal mood and affect  Dizziness - Plan: Basic Metabolic Panel (BMET), CBC, TSH, CANCELED: CBC, CANCELED: Basic Metabolic Panel (BMET), CANCELED: TSH  Check labs.  She may be dehydrated.  Question hypoglycemia.  Does not sound like vertigo.  She is not actively having issues, doubt anything sinister at this moment. F/u prn. Pt voiced understanding and agreement to the plan.  [place], DO 06/16/19 2:46 PM

## 2019-06-16 NOTE — Telephone Encounter (Signed)
Pt called with complaints dizziness, blurred/double vision, and tingling x 3 episodes in 1.5 weeks; she says that she can not open her eyes because "it feels like she is going to pass out"; the pt says that the episodes last app 10 min, and she feels like she is going to pass out; she had tingling in her arms and legs the last episode; her last episode was 06/12/2019; family prompted her to call today; the pt says that she had vertigo in the past, but this feels different; recommendations made per nurse triage protocol; she verbalized understanding; the pt sees Dr Charlett Blake, [facility]; pt transferred to [facility] for scheduling. Reason for Disposition . [1] MODERATE dizziness (e.g., interferes with normal activities) AND [2] has NOT been evaluated by physician for this  (Exception: dizziness caused by heat exposure, sudden standing, or poor fluid intake)  Answer Assessment - Initial Assessment Questions 1. DESCRIPTION: "Describe your dizziness."     "feels like whole world is spinning in circles" 2. LIGHTHEADED: "Do you feel lightheaded?" (e.g., somewhat faint, woozy, weak upon standing)    Felt faint 3. VERTIGO: "Do you feel like either you or the room is spinning or tilting?" (i.e. vertigo)    yes 4. SEVERITY: "How bad is it?"  "Do you feel like you are going to faint?" "Can you stand and walk?"   - MILD - walking normally   - MODERATE - interferes with normal activities (e.g., work, school)    - SEVERE - unable to stand, requires support to walk, feels like passing out now.     Moderate to severe 5. ONSET:  "When did the dizziness begin?"    06/04/2019 6. AGGRAVATING FACTORS: "Does anything make it worse?" (e.g., standing, change in head position)   Nothing makes worse 7. HEART RATE: "Can you tell me your heart rate?" "How many beats in 15 seconds?"  (Note: not all patients can do this)       Pt can not complete this task 8. CAUSE: "What do you think is causing the dizziness?"     Not sure 9.  RECURRENT SYMPTOM: "Have you had dizziness before?" If so, ask: "When was the last time?" "What happened that time?"     Yes vertigo 7-8 years ago 10. OTHER SYMPTOMS: "Do you have any other symptoms?" (e.g., fever, chest pain, vomiting, diarrhea, bleeding)       Brief tingling in arms and legs 11. PREGNANCY: "Is there any chance you are pregnant?" "When was your last menstrual period?"       No  Protocols used: DIZZINESS [facility]

## 2019-06-16 NOTE — Telephone Encounter (Signed)
Patient has appointment today with you at 1130am

## 2019-06-19 ENCOUNTER — Other Ambulatory Visit: Payer: Self-pay | Admitting: Family Medicine

## 2019-08-01 ENCOUNTER — Encounter: Payer: Self-pay | Admitting: Internal Medicine

## 2019-08-02 ENCOUNTER — Other Ambulatory Visit (HOSPITAL_COMMUNITY): Payer: Self-pay | Admitting: *Deleted

## 2019-08-02 DIAGNOSIS — Z1231 Encounter for screening mammogram for malignant neoplasm of breast: Secondary | ICD-10-CM

## 2019-08-25 ENCOUNTER — Ambulatory Visit (AMBULATORY_SURGERY_CENTER): Payer: Self-pay

## 2019-08-25 ENCOUNTER — Other Ambulatory Visit: Payer: Self-pay

## 2019-08-25 VITALS — Temp 97.7°F | Ht 61.0 in | Wt 116.4 lb

## 2019-08-25 DIAGNOSIS — Z01818 Encounter for other preprocedural examination: Secondary | ICD-10-CM

## 2019-08-25 DIAGNOSIS — Z86010 Personal history of colonic polyps: Secondary | ICD-10-CM

## 2019-08-25 NOTE — Progress Notes (Signed)
No soy allergy known to patient, pt state egg intolerance in the past but presently she does eats eggs, no yolks, but also eats cakes and things cooked with eggs with no problems. No issues with past sedation with any surgeries  or procedures, no intubation problems  No diet pills per patient No home 02 use per patient  No blood thinners per patient  Pt denies issues with constipation  No A fib or A flutter  EMMI video sent to pt's e mail   Due to the COVID-19 pandemic we are asking patients to follow these guidelines. Please only bring one care partner. Please be aware that your care partner may wait in the car in the parking lot or if they feel like they will be too hot to wait in the car, they may wait in the lobby on the 4th floor. All care partners are required to wear a mask the entire time (we do not have any that we can provide them), they need to practice social distancing, and we will do a Covid check for all patient's and care partners when you arrive. Also we will check their temperature and your temperature. If the care partner waits in their car they need to stay in the parking lot the entire time and we will call them on their cell phone when the patient is ready for discharge so they can bring the car to the front of the building. Also all patient's will need to wear a mask into building.

## 2019-08-29 ENCOUNTER — Telehealth: Payer: Self-pay

## 2019-08-29 NOTE — Telephone Encounter (Signed)
Contacted patient to schedule covid testing for pre-proc/endoscopy.  Patient states she has had a death in the family and would like to call us back at a more convenient time. Patient had requested to have the covid testing at a [facility] site due to her being a self-pay and [facility] pathology fee is $100.  Spoke with Alisia Ferrari at [facility] short stay site to confirm they are able to do a covid screening for [place].  Pamala Hurry has confirmed.  Request we place covid order for hospital site and they can schedule the patient.  Nothing further needed at this time.

## 2019-08-30 NOTE — Telephone Encounter (Signed)
Patient called to let you know that she prefers to just leave the covid testing location as it was. She does not want to confuse herself for she has a lot on her mind at the moment.

## 2019-08-31 ENCOUNTER — Encounter (HOSPITAL_COMMUNITY): Payer: Self-pay

## 2019-08-31 ENCOUNTER — Telehealth: Payer: Self-pay | Admitting: *Deleted

## 2019-08-31 ENCOUNTER — Ambulatory Visit (HOSPITAL_COMMUNITY): Payer: Self-pay

## 2019-08-31 ENCOUNTER — Ambulatory Visit (HOSPITAL_COMMUNITY)
Admission: RE | Admit: 2019-08-31 | Discharge: 2019-08-31 | Disposition: A | Discharge status: Home / Self Care | Payer: Self-pay | Source: Ambulatory Visit | Attending: Obstetrics and Gynecology | Admitting: Obstetrics and Gynecology

## 2019-08-31 ENCOUNTER — Other Ambulatory Visit: Payer: Self-pay

## 2019-08-31 DIAGNOSIS — Z1239 Encounter for other screening for malignant neoplasm of breast: Secondary | ICD-10-CM

## 2019-08-31 NOTE — Telephone Encounter (Signed)
Opened in error

## 2019-08-31 NOTE — Progress Notes (Signed)
No complaints today.   Pap Smear: Pap smear completed today. Last Pap smear was 07/07/2013 at [facility] and normal with negative HPV. Per patient has a history of an abnormal Pap smear 20 years ago that a repeat Pap smear was completed for follow-up. Patient stated she has had at least three normal Pap smears since repeat Pap smear was completed. Last Pap smear result is in Epic.  Physical exam: Breasts Breasts symmetrical. No skin abnormalities left breast. Bump on skin observed at right breast 6 o'clock 7 cm from the nipple that per patient has been there at least 8 years. No nipple retraction bilateral breasts. No nipple discharge bilateral breasts. No lymphadenopathy. No lumps palpated bilateral breasts. No complaints of pain or tenderness on exam. Referred patient to the [facility] for a screening mammogram. Appointment scheduled for Tuesday, September 05, 2019 at 1030.        Pelvic/Bimanual   Ext Genitalia No lesions, no swelling and no discharge observed on external genitalia.         Vagina Vagina pink and normal texture. No lesions or discharge observed in vagina.          Cervix Cervix is present. Cervix pink and of normal texture. No discharge observed.     Uterus Uterus is present and palpable. Uterus in normal position and normal size.        Adnexae Bilateral ovaries present and palpable. No tenderness on palpation.         Rectovaginal No rectal exam completed today since patient had no rectal complaints. No skin abnormalities observed on exam.    Smoking History: Patient has never smoked.  Patient Navigation: Patient education provided. Access to services provided for patient through [place] program.   Colorectal Cancer Screening: Per patient has never had a colonoscopy completed. Patient has a colonoscopy scheduled for 09/08/2019. No complaints today.   Breast and Cervical Cancer Risk Assessment: Patient has no family history of  breast cancer, known genetic mutations, or radiation treatment to the chest before age 68. Patient has no history of cervical dysplasia, immunocompromised, or DES exposure in-utero.  Risk Assessment    Risk Scores      08/31/2019   Last edited by: Loletta Parish, RN   5-year risk: 0.9 %   Lifetime risk: 5.7 %

## 2019-08-31 NOTE — Patient Instructions (Signed)
Explained breast self awareness with Regis P Vanecek. Let patient know [facility] will cover Pap smears and HPV typing every 5 years unless has a history of abnormal Pap smears. Referred patient to the [facility] for a screening mammogram. Appointment scheduled for Tuesday, September 05, 2019 at 1030. Patient aware of appointment and will be there. Let patient know will follow up with her within the next couple weeks with results of Pap smear by letter or phone. Informed patient that the [facility] will follow-up with her within the next couple of weeks with results of mammogram by letter or phone. Lasondra P Strieter verbalized understanding.  Maijor Hornig, Arvil Chaco, RN 2:15 PM

## 2019-08-31 NOTE — Telephone Encounter (Signed)
Pt states that she will go to the [facility] location to have her Covid test done- she states she is ok paying the fee for the test.

## 2019-09-01 LAB — CYTOLOGY - PAP
Comment: NEGATIVE
Diagnosis: NEGATIVE
High risk HPV: NEGATIVE

## 2019-09-05 ENCOUNTER — Ambulatory Visit (INDEPENDENT_AMBULATORY_CARE_PROVIDER_SITE_OTHER): Payer: Self-pay

## 2019-09-05 ENCOUNTER — Other Ambulatory Visit: Payer: Self-pay | Admitting: Internal Medicine

## 2019-09-05 DIAGNOSIS — Z1159 Encounter for screening for other viral diseases: Secondary | ICD-10-CM

## 2019-09-06 ENCOUNTER — Telehealth (HOSPITAL_COMMUNITY): Payer: Self-pay | Admitting: *Deleted

## 2019-09-06 LAB — SARS CORONAVIRUS 2 (TAT 6-24 HRS): SARS Coronavirus 2: NEGATIVE

## 2019-09-06 NOTE — Telephone Encounter (Signed)
Normal Pap smear and negative HPV result letter mailed to patient 09/06/2019.

## 2019-09-08 ENCOUNTER — Ambulatory Visit (AMBULATORY_SURGERY_CENTER): Payer: Self-pay | Admitting: Internal Medicine

## 2019-09-08 ENCOUNTER — Other Ambulatory Visit: Payer: Self-pay

## 2019-09-08 ENCOUNTER — Encounter: Payer: Self-pay | Admitting: Internal Medicine

## 2019-09-08 VITALS — BP 105/67 | HR 55 | Temp 97.1°F | Resp 15 | Ht 61.0 in | Wt 116.0 lb

## 2019-09-08 DIAGNOSIS — Z86010 Personal history of colonic polyps: Secondary | ICD-10-CM

## 2019-09-08 MED ORDER — SODIUM CHLORIDE 0.9 % IV SOLN: 500.0000 mL | Freq: Once | INTRAVENOUS | Status: DC

## 2019-09-08 NOTE — Progress Notes (Signed)
Vitals-DT Temp-LC  Pt's states no medical or surgical changes since previsit or office visit.  Patient stated that she had propofol during her last procedure, and she did fine.

## 2019-09-08 NOTE — Progress Notes (Signed)
A/ox3, pleased with MAC, report to RN 

## 2019-09-08 NOTE — Patient Instructions (Signed)
YOU HAD AN ENDOSCOPIC PROCEDURE TODAY AT THE [facility]:   Refer to the procedure report that was given to you for any specific questions about what was found during the examination.  If the procedure report does not answer your questions, please call your gastroenterologist to clarify.  If you requested that your care partner not be given the details of your procedure findings, then the procedure report has been included in a sealed envelope for you to review at your convenience later.  YOU SHOULD EXPECT: Some feelings of bloating in the abdomen. Passage of more gas than usual.  Walking can help get rid of the air that was put into your GI tract during the procedure and reduce the bloating. If you had a lower endoscopy (such as a colonoscopy or flexible sigmoidoscopy) you may notice spotting of blood in your stool or on the toilet paper. If you underwent a bowel prep for your procedure, you may not have a normal bowel movement for a few days.  Please Note:  You might notice some irritation and congestion in your nose or some drainage.  This is from the oxygen used during your procedure.  There is no need for concern and it should clear up in a day or so.  SYMPTOMS TO REPORT IMMEDIATELY:   Following lower endoscopy (colonoscopy or flexible sigmoidoscopy):  Excessive amounts of blood in the stool  Significant tenderness or worsening of abdominal pains  Swelling of the abdomen that is new, acute  Fever of 100F or higher  For urgent or emergent issues, a gastroenterologist can be reached at any hour by calling [phone].   DIET:  We do recommend a small meal at first, but then you may proceed to your regular diet.  Drink plenty of fluids but you should avoid alcoholic beverages for 24 hours.  ACTIVITY:  You should plan to take it easy for the rest of today and you should NOT DRIVE or use heavy machinery until tomorrow (because of the sedation medicines used during the test).     FOLLOW UP: Our staff will call the number listed on your records 48-72 hours following your procedure to check on you and address any questions or concerns that you may have regarding the information given to you following your procedure. If we do not reach you, we will leave a message.  We will attempt to reach you two times.  During this call, we will ask if you have developed any symptoms of COVID 19. If you develop any symptoms (ie: fever, flu-like symptoms, shortness of breath, cough etc.) before then, please call [phone].  If you test positive for Covid 19 in the 2 weeks post procedure, please call and report this information to us.    If any biopsies were taken you will be contacted by phone or by letter within the next 1-3 weeks.  Please call us at [phone] if you have not heard about the biopsies in 3 weeks.    SIGNATURES/CONFIDENTIALITY: You and/or your care partner have signed paperwork which will be entered into your electronic medical record.  These signatures attest to the fact that that the information above on your After Visit Summary has been reviewed and is understood.  Full responsibility of the confidentiality of this discharge information lies with you and/or your care-partner. 

## 2019-09-08 NOTE — Op Note (Signed)
[place] Patient Name: Autumn Myers Procedure Date: 09/08/2019 11:45 AM MRN: [id number] Endoscopist: Gatha Mayer , MD Age: 59 Referring MD:  Date of Birth: August 01, 1961 Gender: Female Account #: [id number] Procedure:                Colonoscopy Indications:              Surveillance: Personal history of adenomatous                            polyps on last colonoscopy > 5 years ago Medicines:                Propofol per Anesthesia, Monitored Anesthesia Care Procedure:                Pre-Anesthesia Assessment:                           - Prior to the procedure, a History and Physical                            was performed, and patient medications and                            allergies were reviewed. The patient's tolerance of                            previous anesthesia was also reviewed. The risks                            and benefits of the procedure and the sedation                            options and risks were discussed with the patient.                            All questions were answered, and informed consent                            was obtained. Prior Anticoagulants: The patient has                            taken no previous anticoagulant or antiplatelet                            agents. ASA Grade Assessment: II - A patient with                            mild systemic disease. After reviewing the risks                            and benefits, the patient was deemed in                            satisfactory condition to undergo the procedure.  After obtaining informed consent, the colonoscope                            was passed under direct vision. Throughout the                            procedure, the patient's blood pressure, pulse, and                            oxygen saturations were monitored continuously. The                            Colonoscope was introduced through the anus and   advanced to the the cecum, identified by                            appendiceal orifice and ileocecal valve. The                            colonoscopy was performed without difficulty. The                            patient tolerated the procedure well. The quality                            of the bowel preparation was excellent. The bowel                            preparation used was Miralax via split dose                            instruction. The ileocecal valve, appendiceal                            orifice, and rectum were photographed. Scope In: 11:53:24 AM Scope Out: 12:03:11 PM Scope Withdrawal Time: 0 hours 7 minutes 0 seconds  Total Procedure Duration: 0 hours 9 minutes 47 seconds  Findings:                 The perianal and digital rectal examinations were                            normal.                           The colon (entire examined portion) appeared normal.                           No additional abnormalities were found on                            retroflexion. Complications:            No immediate complications. Estimated Blood Loss:     Estimated blood loss: none. Impression:               - The entire  examined colon is normal.                           - No specimens collected.                           - Personal history of colonic polyp - diminutive                            adenoma 2014. Recommendation:           - Patient has a contact number available for                            emergencies. The signs and symptoms of potential                            delayed complications were discussed with the                            patient. Return to normal activities tomorrow.                            Written discharge instructions were provided to the                            patient.                           - Resume previous diet.                           - Continue present medications.                           - Repeat colonoscopy in 10  years. Gatha Mayer, MD 09/08/2019 12:10:55 PM This report has been signed electronically.

## 2019-09-12 ENCOUNTER — Telehealth: Payer: Self-pay | Admitting: *Deleted

## 2019-09-12 NOTE — Telephone Encounter (Signed)
  Follow up Call-  Call back number 09/08/2019  Post procedure Call Back phone  # [phone]  Permission to leave phone message Yes  Some recent data might be hidden     Patient questions:  Do you have a fever, pain , or abdominal swelling? No. Pain Score  0 *  Have you tolerated food without any problems? Yes.    Have you been able to return to your normal activities? Yes.    Do you have any questions about your discharge instructions: Diet   No. Medications  No. Follow up visit  No.  Do you have questions or concerns about your Care? No.  Actions: * If pain score is 4 or above: No action needed, pain <4.  1. Have you developed a fever since your procedure? no  2.   Have you had an respiratory symptoms (SOB or cough) since your procedure? no  3.   Have you tested positive for COVID 19 since your procedure no  4.   Have you had any family members/close contacts diagnosed with the COVID 19 since your procedure?  no   If yes to any of these questions please route to Joylene John, RN and Alphonsa Gin, Therapist, sports.

## 2019-09-14 ENCOUNTER — Other Ambulatory Visit: Payer: Self-pay | Admitting: Family Medicine

## 2019-09-15 NOTE — Telephone Encounter (Signed)
Autumn Lukes, MD  You 2 hours ago (12:10 PM)   Thanks if we could just get her set up by May it would be great. I think she either has bad or no insurance. It is up to her whether it is virtual or in person. Dr Jacinto Reap

## 2019-09-15 NOTE — Telephone Encounter (Signed)
Dr Charlett Blake -- Just refilled pt's citalopram and see that she was last seen 12/2018 and has no future appointments scheduled. When is pt due for routine follow up?

## 2019-09-15 NOTE — Telephone Encounter (Signed)
Spoke with pt and scheduled in person follow up for 12/15/19 at 9:40am.

## 2019-09-28 ENCOUNTER — Ambulatory Visit
Admission: RE | Admit: 2019-09-28 | Discharge: 2019-09-28 | Disposition: A | Discharge status: Home / Self Care | Payer: No Typology Code available for payment source | Source: Ambulatory Visit | Attending: Obstetrics and Gynecology | Admitting: Obstetrics and Gynecology

## 2019-09-28 ENCOUNTER — Other Ambulatory Visit: Payer: Self-pay

## 2019-09-28 DIAGNOSIS — Z1231 Encounter for screening mammogram for malignant neoplasm of breast: Secondary | ICD-10-CM

## 2019-12-15 ENCOUNTER — Other Ambulatory Visit: Payer: Self-pay | Admitting: *Deleted

## 2019-12-15 MED ORDER — CITALOPRAM HYDROBROMIDE 40 MG PO TABS: 40.0000 mg | ORAL_TABLET | Freq: Every day | ORAL | 0 refills | Status: DC

## 2019-12-18 ENCOUNTER — Ambulatory Visit: Payer: Self-pay | Admitting: Family Medicine

## 2020-01-05 ENCOUNTER — Other Ambulatory Visit: Payer: Self-pay

## 2020-01-05 ENCOUNTER — Encounter: Payer: Self-pay | Admitting: Family Medicine

## 2020-01-05 ENCOUNTER — Telehealth (INDEPENDENT_AMBULATORY_CARE_PROVIDER_SITE_OTHER): Payer: Self-pay | Admitting: Family Medicine

## 2020-01-05 ENCOUNTER — Telehealth: Payer: Self-pay | Admitting: *Deleted

## 2020-01-05 DIAGNOSIS — F418 Other specified anxiety disorders: Secondary | ICD-10-CM

## 2020-01-05 DIAGNOSIS — E559 Vitamin D deficiency, unspecified: Secondary | ICD-10-CM

## 2020-01-05 NOTE — Telephone Encounter (Signed)
Left message on machine to call back  Needs appointment for CPE Dec or Jan

## 2020-01-07 NOTE — Assessment & Plan Note (Signed)
Encouraged heart healthy diet, increase exercise, avoid trans fats, consider a krill oil cap daily 

## 2020-01-07 NOTE — Assessment & Plan Note (Signed)
Supplement and monitor 

## 2020-01-07 NOTE — Assessment & Plan Note (Signed)
Doing well at this time on current meds. No changes

## 2020-01-07 NOTE — Progress Notes (Signed)
Virtual Visit via Phone Note  I connected with Autumn Myers on 01/05/20 at  9:00 AM EDT by a phone enabled telemedicine application and verified that I am speaking with the correct person using two identifiers.  Location: Patient: home Provider: home, patient and provider are in visit   I discussed the limitations of evaluation and management by telemedicine and the availability of in person appointments. The patient expressed understanding and agreed to proceed. Kem Boroughs, CMA was able to get the patient set up on a visit, phone after being unable to set up a video visit.       Subjective:    Patient ID: Autumn Myers, female    DOB: [birth year], 59 y.o.   MRN: [id number]  Chief Complaint  Patient presents with  . Follow-up    HPI Patient is in today for follow up on chronic medical concerns. No recent febrile illness or hospitalizations. She is doing very well. She feels her medications are helping her manage her current doses of Buspar and Celexa. She has had her colonoscopy, pap and mammogram and everything went well. No acute concerns. Denies CP/palp/SOB/HA/congestion/fevers/GI or GU c/o. Taking meds as prescribed  Past Medical History:  Diagnosis Date  . Abnormal results of thyroid function studies 01/28/2013  . Allergic state 11/20/2011  . Allergy    seasonal  . Anxiety   . Cancer ([place])    basal cell  . Chicken pox as a child  . Depression   . Depression with anxiety   . Headache(784.0) 11/20/2011  . Hematuria 01/29/2012  . Hip pain, right 01/29/2012  . Myalgia 11/20/2011  . Neck pain 05/13/2013  . Other and unspecified hyperlipidemia 05/13/2013  . Personal history of colonic adenoma 06/23/2013   06/23/2013 2 dminutive polyps    . Preventative health care 10/13/2011  . Puncture wound of thigh, right 02/03/2012  . Raynaud phenomenon 04/15/2014  . Renal lithiasis 01/29/2012  . Sinusitis acute 01/29/2012  . Sinusitis, acute 01/29/2012  . Smoker   . Vitamin D  deficiency   . Wound infection 06/20/2012    Past Surgical History:  Procedure Laterality Date  . CARPAL TUNNEL RELEASE     on right and left  . CESAREAN SECTION  1994  . COLONOSCOPY  2014  . COLONOSCOPY W/ POLYPECTOMY  2014   1 was precancerous  . LITHOTRIPSY    . POLYPECTOMY      Family History  Problem Relation Age of Onset  . Hypertension Mother   . Parkinsonism Mother   . Stroke Father   . Hypertension Father   . Cancer Father        leukemia  . Colon polyps Father   . Depression Sister   . Anxiety disorder Sister   . Colon polyps Sister   . Heart disease Maternal Grandfather   . Colon polyps Brother   . Parkinsonism Maternal Grandmother   . Colon cancer Neg Hx   . Stomach cancer Neg Hx   . Esophageal cancer Neg Hx   . Rectal cancer Neg Hx     Social History   Socioeconomic History  . Marital status: Legally Separated    Spouse name: Not on file  . Number of children: Not on file  . Years of education: Not on file  . Highest education level: 12th grade  Occupational History  . Not on file  Tobacco Use  . Smoking status: Current Every Day Smoker    Years: 15.00    Types:  Cigarettes  . Smokeless tobacco: Never Used  . Tobacco comment: less than a pack a day  Substance and Sexual Activity  . Alcohol use: No  . Drug use: No  . Sexual activity: Not Currently    Partners: Male    Birth control/protection: None  Other Topics Concern  . Not on file  Social History Narrative  . Not on file   Social Determinants of Health   Financial Resource Strain:   . Difficulty of Paying Living Expenses:   Food Insecurity:   . Worried About Charity fundraiser in the Last Year:   . Arboriculturist in the Last Year:   Transportation Needs: No Transportation Needs  . Lack of Transportation (Medical): No  . Lack of Transportation (Non-Medical): No  Physical Activity:   . Days of Exercise per Week:   . Minutes of Exercise per Session:   Stress:   . Feeling of  Stress :   Social Connections:   . Frequency of Communication with Friends and Family:   . Frequency of Social Gatherings with Friends and Family:   . Attends Religious Services:   . Active Member of Clubs or Organizations:   . Attends Archivist Meetings:   Marland Kitchen Marital Status:   Intimate Partner Violence:   . Fear of Current or Ex-Partner:   . Emotionally Abused:   Marland Kitchen Physically Abused:   . Sexually Abused:     Outpatient Medications Prior to Visit  Medication Sig Dispense Refill  . Biotin 10000 MCG TABS Take 1 tablet by mouth daily.    . busPIRone (BUSPAR) 10 MG tablet Take 1 tablet (10 mg total) by mouth 3 (three) times daily. 270 tablet 1  . cholecalciferol (VITAMIN D) 1000 units tablet Take 5,000 Units by mouth daily.     . citalopram (CELEXA) 40 MG tablet Take 1 tablet (40 mg total) by mouth daily. 90 tablet 0  . cyclobenzaprine (FLEXERIL) 10 MG tablet TAKE ONE TABLET BY MOUTH ONCE DAILY AT BEDTIME AS NEEDED FOR MUSCLE SPASM 90 tablet 1  . loratadine (CLARITIN) 10 MG tablet Take 10 mg by mouth daily.    . Multiple Vitamin (MULTIVITAMIN) tablet Take 1 tablet by mouth daily.     No facility-administered medications prior to visit.    Allergies  Allergen Reactions  . Eggs Or Egg-Derived Products Nausea And Vomiting    cramping  . Neosporin [Neomycin-Bacitracin Zn-Polymyx] Itching    Inflamed, itchy   . Penicillins Hives  . Codeine     GI upset    Review of Systems  Constitutional: Negative for fever and malaise/fatigue.  HENT: Negative for congestion.   Eyes: Negative for blurred vision.  Respiratory: Negative for shortness of breath.   Cardiovascular: Negative for chest pain, palpitations and leg swelling.  Gastrointestinal: Negative for abdominal pain, blood in stool and nausea.  Genitourinary: Negative for dysuria and frequency.  Musculoskeletal: Negative for falls.  Skin: Negative for rash.  Neurological: Negative for dizziness, loss of consciousness and  headaches.  Endo/Heme/Allergies: Negative for environmental allergies.  Psychiatric/Behavioral: Negative for depression. The patient is not nervous/anxious.        Objective:    Physical Exam unable to via phone visit  Wt 112 lb (50.8 kg)   LMP 11/16/2008   BMI 21.16 kg/m  Wt Readings from Last 3 Encounters:  01/05/20 112 lb (50.8 kg)  09/08/19 116 lb (52.6 kg)  08/31/19 112 lb (50.8 kg)    Diabetic Foot Exam -  Simple   No data filed     Lab Results  Component Value Date   WBC 8.0 06/16/2019   HGB 14.0 06/16/2019   HCT 41.9 06/16/2019   PLT 365.0 06/16/2019   GLUCOSE 78 06/16/2019   CHOL 197 05/08/2013   TRIG 258 (H) 05/08/2013   HDL 59 05/08/2013   LDLCALC 86 05/08/2013   ALT 8 09/22/2016   AST 14 09/22/2016   NA 142 06/16/2019   K 4.1 06/16/2019   CL 105 06/16/2019   CREATININE 0.88 06/16/2019   BUN 10 06/16/2019   CO2 28 06/16/2019   TSH 3.87 06/16/2019    Lab Results  Component Value Date   TSH 3.87 06/16/2019   Lab Results  Component Value Date   WBC 8.0 06/16/2019   HGB 14.0 06/16/2019   HCT 41.9 06/16/2019   MCV 92.0 06/16/2019   PLT 365.0 06/16/2019   Lab Results  Component Value Date   NA 142 06/16/2019   K 4.1 06/16/2019   CO2 28 06/16/2019   GLUCOSE 78 06/16/2019   BUN 10 06/16/2019   CREATININE 0.88 06/16/2019   BILITOT 0.3 09/22/2016   ALKPHOS 80 09/22/2016   AST 14 09/22/2016   ALT 8 09/22/2016   PROT 6.8 09/22/2016   ALBUMIN 4.5 09/22/2016   CALCIUM 10.0 06/16/2019   GFR 66.15 06/16/2019   Lab Results  Component Value Date   CHOL 197 05/08/2013   Lab Results  Component Value Date   HDL 59 05/08/2013   Lab Results  Component Value Date   LDLCALC 86 05/08/2013   Lab Results  Component Value Date   TRIG 258 (H) 05/08/2013   Lab Results  Component Value Date   CHOLHDL 3.3 05/08/2013   No results found for: HGBA1C     Assessment & Plan:   Problem List Items Addressed This Visit    Depression with anxiety     Doing well at this time on current meds. No changes      Vitamin D deficiency    Supplement and monitor         I am having Autumn Myers maintain her loratadine, cholecalciferol, busPIRone, cyclobenzaprine, Biotin, citalopram, and multivitamin.  No orders of the defined types were placed in this encounter.    I discussed the assessment and treatment plan with the patient. The patient was provided an opportunity to ask questions and all were answered. The patient agreed with the plan and demonstrated an understanding of the instructions.   The patient was advised to call back or seek an in-person evaluation if the symptoms worsen or if the condition fails to improve as anticipated.  I provided 15 minutes of non-face-to-face time during this encounter.   Penni Homans, MD

## 2020-03-14 ENCOUNTER — Other Ambulatory Visit: Payer: Self-pay | Admitting: Family Medicine

## 2020-04-16 ENCOUNTER — Other Ambulatory Visit: Payer: Self-pay | Admitting: Family Medicine

## 2020-06-11 ENCOUNTER — Other Ambulatory Visit: Payer: Self-pay | Admitting: Family Medicine

## 2020-09-09 ENCOUNTER — Other Ambulatory Visit: Payer: Self-pay | Admitting: Family Medicine

## 2020-12-09 ENCOUNTER — Other Ambulatory Visit: Payer: Self-pay | Admitting: Family Medicine

## 2020-12-18 ENCOUNTER — Other Ambulatory Visit: Payer: Self-pay

## 2020-12-18 DIAGNOSIS — Z1231 Encounter for screening mammogram for malignant neoplasm of breast: Secondary | ICD-10-CM

## 2021-01-02 ENCOUNTER — Other Ambulatory Visit: Payer: Self-pay

## 2021-01-02 ENCOUNTER — Ambulatory Visit: Payer: No Typology Code available for payment source | Admitting: *Deleted

## 2021-01-02 ENCOUNTER — Ambulatory Visit
Admission: RE | Admit: 2021-01-02 | Discharge: 2021-01-02 | Disposition: A | Discharge status: Home / Self Care | Payer: No Typology Code available for payment source | Source: Ambulatory Visit | Attending: Family Medicine | Admitting: Family Medicine

## 2021-01-02 ENCOUNTER — Other Ambulatory Visit: Payer: Self-pay | Admitting: Obstetrics and Gynecology

## 2021-01-02 VITALS — BP 114/74 | Wt 113.0 lb

## 2021-01-02 DIAGNOSIS — Z1231 Encounter for screening mammogram for malignant neoplasm of breast: Secondary | ICD-10-CM

## 2021-01-02 DIAGNOSIS — Z1239 Encounter for other screening for malignant neoplasm of breast: Secondary | ICD-10-CM

## 2021-01-02 NOTE — Progress Notes (Signed)
Ms. Autumn Myers is a 60 y.o. female who presents to [facility] clinic today with no complaints.    Pap Smear: Pap smear not completed today. Last Pap smear was 08/31/2019 at [facility] clinic and was normal with negative HPV. Per patient has no history of an abnormal Pap smear. Last Pap smear result is available in Epic.   Physical exam: Breasts Breasts symmetrical. No skin abnormalities bilateral breasts. No nipple retraction bilateral breasts. No nipple discharge bilateral breasts. No lymphadenopathy. No lumps palpated bilateral breasts. No complaints of pain or tenderness on exam.      MS DIGITAL SCREENING TOMO BILATERAL  Result Date: 09/29/2019 CLINICAL DATA:  Screening. EXAM: DIGITAL SCREENING BILATERAL MAMMOGRAM WITH TOMO AND CAD COMPARISON:  Previous exam(s). ACR Breast Density Category c: The breast tissue is heterogeneously dense, which may obscure small masses. FINDINGS: There are no findings suspicious for malignancy. Images were processed with CAD. IMPRESSION: No mammographic evidence of malignancy. A result letter of this screening mammogram will be mailed directly to the patient. RECOMMENDATION: Screening mammogram in one year. (Code:SM-B-01Y) BI-RADS CATEGORY  1: Negative. Electronically Signed   By: Audie Pinto M.D.   On: 09/29/2019 14:19   Pelvic/Bimanual Pap is not indicated today per BCCCP guidelines.   Smoking History: Patient is a current smoker. Discussed smoking cessation with patient. Referred to the [facility] Quitline and gave resources to the free smoking cessation classes at [facility].    Patient Navigation: Patient education provided. Access to services provided for patient through [facility] program.   Colorectal Cancer Screening: Per patient has had colonoscopy completed on 09/08/2019 at [place]. No complaints today.    Breast and Cervical Cancer Risk Assessment: Patient does not have family history of breast cancer, known genetic mutations, or radiation treatment to the  chest before age 69. Patient does not have history of cervical dysplasia, immunocompromised, or DES exposure in-utero.  Risk Assessment    Risk Scores      01/02/2021 08/31/2019   Last edited by: Demetrius Revel, LPN Layman Gully, Heath Gold, RN   5-year risk: 1 % 0.9 %   Lifetime risk: 5.6 % 5.7 %          A: BCCCP exam without pap smear No complaints.  P: Referred patient to the [facility] for a screening mammogram on the mobile unit. Appointment scheduled Thursday, January 02, 2021 at 1130.  Loletta Parish, RN 01/02/2021 10:33 AM

## 2021-01-02 NOTE — Patient Instructions (Signed)
Explained breast self awareness with Autumn Myers. Patient did not need a Pap smear today due to last Pap smear and HPV typing was 08/31/2019. Let her know BCCCP will cover Pap smears and HPV typing every 5 years unless has a history of abnormal Pap smears. Referred patient to the [facility] for a screening mammogram on the mobile unit. Appointment scheduled Thursday, January 02, 2021 at 1130. Patient escorted to the mobile unit following BCCCP appointment for her screening mammogram. Let patient know the [facility] will follow up with her within the next couple weeks with results of her mammogram by letter or phone. Discussed smoking cessation with patient. Referred to the [facility] Quitline and gave resources to the free smoking cessation classes at [facility]. [name] verbalized understanding.  Earl Zellmer, Arvil Chaco, RN 10:33 AM

## 2021-01-27 ENCOUNTER — Other Ambulatory Visit: Payer: Self-pay

## 2021-01-27 ENCOUNTER — Ambulatory Visit (INDEPENDENT_AMBULATORY_CARE_PROVIDER_SITE_OTHER): Payer: Self-pay | Admitting: Family Medicine

## 2021-01-27 VITALS — BP 109/64 | HR 67 | Temp 98.1°F | Resp 12 | Ht 61.0 in | Wt 113.0 lb

## 2021-01-27 DIAGNOSIS — F32A Depression, unspecified: Secondary | ICD-10-CM

## 2021-01-27 DIAGNOSIS — E785 Hyperlipidemia, unspecified: Secondary | ICD-10-CM

## 2021-01-27 DIAGNOSIS — F418 Other specified anxiety disorders: Secondary | ICD-10-CM

## 2021-01-27 DIAGNOSIS — F17200 Nicotine dependence, unspecified, uncomplicated: Secondary | ICD-10-CM

## 2021-01-27 DIAGNOSIS — G8929 Other chronic pain: Secondary | ICD-10-CM

## 2021-01-27 DIAGNOSIS — F419 Anxiety disorder, unspecified: Secondary | ICD-10-CM

## 2021-01-27 DIAGNOSIS — M545 Low back pain, unspecified: Secondary | ICD-10-CM

## 2021-01-27 DIAGNOSIS — E559 Vitamin D deficiency, unspecified: Secondary | ICD-10-CM

## 2021-01-27 MED ORDER — TIZANIDINE HCL 2 MG PO TABS: 1.0000 mg | ORAL_TABLET | Freq: Three times a day (TID) | ORAL | 1 refills | Status: DC | PRN

## 2021-01-27 NOTE — Assessment & Plan Note (Signed)
Is debilitating and worsening over several years. No recent fall or injury. She cannot stand, sit walk for any extended period dof time without significant pain in her right lower back and some radiation to her right leg at times.she is ready to get the pain worked up. xrays are ordered today. She can alternate Ibuprofen and Tylenol and she is prescribed Tizanidine to use prn. Also use moist heat and stretching

## 2021-01-27 NOTE — Progress Notes (Signed)
Subjective:    Patient ID: Autumn Myers, female    DOB: 10/08/1960, 60 y.o.   MRN: [id number]  Chief Complaint  Patient presents with   Follow-up    HPI Patient is in today for evaluation of severe low back pain. She has struggled with notable pain off and on for years and denies any significant trauma att he beginning of her ordeal. She notes no urinary or fecal incontinence. Her pain does radiate to right thigh at times. Denies CP/palp/SOB/HA/congestion/fevers/GI or GU c/o. Taking meds as prescribed   Past Medical History:  Diagnosis Date   Abnormal results of thyroid function studies 01/28/2013   Allergic state 11/20/2011   Allergy    seasonal   Anxiety    Cancer ([place])    basal cell   Chicken pox as a child   Depression    Depression with anxiety    Headache(784.0) 11/20/2011   Hematuria 01/29/2012   Hip pain, right 01/29/2012   Myalgia 11/20/2011   Neck pain 05/13/2013   Other and unspecified hyperlipidemia 05/13/2013   Personal history of colonic adenoma 06/23/2013   06/23/2013 2 dminutive polyps     Preventative health care 10/13/2011   Puncture wound of thigh, right 02/03/2012   Raynaud phenomenon 04/15/2014   Renal lithiasis 01/29/2012   Sinusitis acute 01/29/2012   Sinusitis, acute 01/29/2012   Smoker    Vitamin D deficiency    Wound infection 06/20/2012    Past Surgical History:  Procedure Laterality Date   CARPAL TUNNEL RELEASE     on right and left   CESAREAN SECTION  1994   COLONOSCOPY  2014   COLONOSCOPY W/ POLYPECTOMY  2014   1 was precancerous   LITHOTRIPSY     POLYPECTOMY      Family History  Problem Relation Age of Onset   Hypertension Mother    Parkinsonism Mother    Stroke Father    Hypertension Father    Cancer Father        leukemia   Colon polyps Father    Depression Sister    Anxiety disorder Sister    Colon polyps Sister    Heart disease Maternal Grandfather    Colon polyps Brother    Parkinsonism Maternal Grandmother    Colon  cancer Neg Hx    Stomach cancer Neg Hx    Esophageal cancer Neg Hx    Rectal cancer Neg Hx     Social History   Socioeconomic History   Marital status: Divorced    Spouse name: Not on file   Number of children: 2   Years of education: Not on file   Highest education level: High school graduate  Occupational History   Not on file  Tobacco Use   Smoking status: Every Day    Years: 15.00    Pack years: 0.00    Types: Cigarettes   Smokeless tobacco: Never   Tobacco comments:    less than a pack a day  Vaping Use   Vaping Use: Never used  Substance and Sexual Activity   Alcohol use: No   Drug use: No   Sexual activity: Not Currently    Partners: Male    Birth control/protection: None  Other Topics Concern   Not on file  Social History Narrative   Not on file   Social Determinants of Health   Financial Resource Strain: Not on file  Food Insecurity: Not on file  Transportation Needs: No Transportation Needs  Lack of Transportation (Medical): No   Lack of Transportation (Non-Medical): No  Physical Activity: Not on file  Stress: Not on file  Social Connections: Not on file  Intimate Partner Violence: Not on file    Outpatient Medications Prior to Visit  Medication Sig Dispense Refill   Biotin 10000 MCG TABS Take 1 tablet by mouth daily.     busPIRone (BUSPAR) 10 MG tablet TAKE 1 TABLET BY MOUTH THREE TIMES DAILY 270 tablet 0   cholecalciferol (VITAMIN D) 1000 units tablet Take 5,000 Units by mouth daily.      citalopram (CELEXA) 40 MG tablet Take 1 tablet by mouth once daily 90 tablet 0   cyclobenzaprine (FLEXERIL) 10 MG tablet TAKE ONE TABLET BY MOUTH ONCE DAILY AT BEDTIME AS NEEDED FOR MUSCLE SPASM 90 tablet 1   loratadine (CLARITIN) 10 MG tablet Take 10 mg by mouth daily.     Multiple Vitamin (MULTIVITAMIN) tablet Take 1 tablet by mouth daily.     No facility-administered medications prior to visit.    Allergies  Allergen Reactions   Eggs Or Egg-Derived  Products Nausea And Vomiting    cramping   Neosporin [Neomycin-Bacitracin Zn-Polymyx] Itching    Inflamed, itchy    Penicillins Hives   Codeine     GI upset    Review of Systems  Constitutional:  Negative for fever and malaise/fatigue.  HENT:  Negative for congestion.   Eyes:  Negative for blurred vision.  Respiratory:  Negative for shortness of breath.   Cardiovascular:  Negative for chest pain, palpitations and leg swelling.  Gastrointestinal:  Negative for abdominal pain, blood in stool and nausea.  Genitourinary:  Negative for dysuria and frequency.  Musculoskeletal:  Positive for back pain and joint pain. Negative for falls.  Skin:  Negative for rash.  Neurological:  Negative for dizziness, loss of consciousness and headaches.  Endo/Heme/Allergies:  Negative for environmental allergies.  Psychiatric/Behavioral:  Positive for depression. The patient is nervous/anxious.       Objective:    Physical Exam Constitutional:      General: She is not in acute distress.    Appearance: She is well-developed.  HENT:     Head: Normocephalic and atraumatic.  Eyes:     Conjunctiva/sclera: Conjunctivae normal.  Neck:     Thyroid: No thyromegaly.  Cardiovascular:     Rate and Rhythm: Normal rate and regular rhythm.     Heart sounds: Normal heart sounds. No murmur heard. Pulmonary:     Effort: Pulmonary effort is normal. No respiratory distress.     Breath sounds: Normal breath sounds.  Abdominal:     General: Bowel sounds are normal. There is no distension.     Palpations: Abdomen is soft. There is no mass.     Tenderness: There is no abdominal tenderness.  Musculoskeletal:     Cervical back: Neck supple.  Lymphadenopathy:     Cervical: No cervical adenopathy.  Skin:    General: Skin is warm and dry.  Neurological:     Mental Status: She is alert and oriented to person, place, and time.  Psychiatric:        Behavior: Behavior normal.    BP 109/64 (BP Location: Right Arm,  Cuff Size: Normal)   Pulse 67   Temp 98.1 F (36.7 C)   Resp 12   Ht 5\' 1"  (1.549 m)   Wt 113 lb (51.3 kg)   LMP 11/16/2008   SpO2 100%   BMI 21.35 kg/m  Wt  Readings from Last 3 Encounters:  01/27/21 113 lb (51.3 kg)  01/02/21 113 lb (51.3 kg)  01/05/20 112 lb (50.8 kg)    Diabetic Foot Exam - Simple   No data filed    Lab Results  Component Value Date   WBC 8.0 06/16/2019   HGB 14.0 06/16/2019   HCT 41.9 06/16/2019   PLT 365.0 06/16/2019   GLUCOSE 78 06/16/2019   CHOL 197 05/08/2013   TRIG 258 (H) 05/08/2013   HDL 59 05/08/2013   LDLCALC 86 05/08/2013   ALT 8 09/22/2016   AST 14 09/22/2016   NA 142 06/16/2019   K 4.1 06/16/2019   CL 105 06/16/2019   CREATININE 0.88 06/16/2019   BUN 10 06/16/2019   CO2 28 06/16/2019   TSH 3.87 06/16/2019    Lab Results  Component Value Date   TSH 3.87 06/16/2019   Lab Results  Component Value Date   WBC 8.0 06/16/2019   HGB 14.0 06/16/2019   HCT 41.9 06/16/2019   MCV 92.0 06/16/2019   PLT 365.0 06/16/2019   Lab Results  Component Value Date   NA 142 06/16/2019   K 4.1 06/16/2019   CO2 28 06/16/2019   GLUCOSE 78 06/16/2019   BUN 10 06/16/2019   CREATININE 0.88 06/16/2019   BILITOT 0.3 09/22/2016   ALKPHOS 80 09/22/2016   AST 14 09/22/2016   ALT 8 09/22/2016   PROT 6.8 09/22/2016   ALBUMIN 4.5 09/22/2016   CALCIUM 10.0 06/16/2019   GFR 66.15 06/16/2019   Lab Results  Component Value Date   CHOL 197 05/08/2013   Lab Results  Component Value Date   HDL 59 05/08/2013   Lab Results  Component Value Date   LDLCALC 86 05/08/2013   Lab Results  Component Value Date   TRIG 258 (H) 05/08/2013   Lab Results  Component Value Date   CHOLHDL 3.3 05/08/2013   No results found for: HGBA1C     Assessment & Plan:   Problem List Items Addressed This Visit     Depression with anxiety    She is struggling with daily pain and parents who are failing and dneed her help. She feels her medications are helping  enough and does not want to change them       Vitamin D deficiency - Primary    Supplement and monitor       Relevant Orders   Comprehensive metabolic panel   TSH   VITAMIN D 25 Hydroxy (Vit-D Deficiency, Fractures)   Smoker    Encouraged cessation but she is still unwilling to attempt it       Low back pain    Is debilitating and worsening over several years. No recent fall or injury. She cannot stand, sit walk for any extended period dof time without significant pain in her right lower back and some radiation to her right leg at times.she is ready to get the pain worked up. xrays are ordered today. She can alternate Ibuprofen and Tylenol and she is prescribed Tizanidine to use prn. Also use moist heat and stretching       Relevant Medications   tiZANidine (ZANAFLEX) 2 MG tablet   Other Relevant Orders   CBC   DG Lumbar Spine 2-3 Views   DG Sacrum/Coccyx   Other Visit Diagnoses     Anxiety and depression       Hyperlipidemia, unspecified hyperlipidemia type       Relevant Orders   Lipid panel  TSH       I am having Autumn Myers start on tiZANidine. I am also having her maintain her loratadine, cholecalciferol, cyclobenzaprine, Biotin, multivitamin, citalopram, and busPIRone.  Meds ordered this encounter  Medications   tiZANidine (ZANAFLEX) 2 MG tablet    Sig: Take 0.5-2 tablets (1-4 mg total) by mouth every 8 (eight) hours as needed for muscle spasms.    Dispense:  40 tablet    Refill:  1     Penni Homans, MD

## 2021-01-27 NOTE — Patient Instructions (Signed)
Advil/Motrin/Ibuprofen 200 mg tabs, 2 tabs every 4-6 hours with food and good hydration, max of 2400 mg in 24 hours, (same family Aleve/Naproxen known as NSAIDs do not mix and match  If you need breakthrough pain meds Tylenol/Acetaminophen ES 500 mg tabs 1-2 up to three x a day max of 3000 mg in 24  Chronic Back Pain When back pain lasts longer than 3 months, it is called chronic back pain. The cause of your back pain may not be known. Some common causes include: Wear and tear (degenerative disease) of the bones, ligaments, or disks in your back. Inflammation and stiffness in your back (arthritis). People who have chronic back pain often go through certain periods in which the pain is more intense (flare-ups). Many people can learn to manage the pain with home care. Follow these instructions at home: Pay attention to any changes in your symptoms. Take these actions to help withyour pain: Managing pain and stiffness     If directed, apply ice to the painful area. Your health care provider may recommend applying ice during the first 24-48 hours after a flare-up begins. To do this: Put ice in a plastic bag. Place a towel between your skin and the bag. Leave the ice on for 20 minutes, 2-3 times per day. If directed, apply heat to the affected area as often as told by your health care provider. Use the heat source that your health care provider recommends, such as a moist heat pack or a heating pad. Place a towel between your skin and the heat source. Leave the heat on for 20-30 minutes. Remove the heat if your skin turns bright red. This is especially important if you are unable to feel pain, heat, or cold. You may have a greater risk of getting burned. Try soaking in a warm tub. Activity  Avoid bending and other activities that make the problem worse. Maintain a proper position when standing or sitting: When standing, keep your upper back and neck straight, with your shoulders pulled back.  Avoid slouching. When sitting, keep your back straight and relax your shoulders. Do not round your shoulders or pull them backward. Do not sit or stand in one place for long periods of time. Take brief periods of rest throughout the day. This will reduce your pain. Resting in a lying or standing position is usually better than sitting to rest. When you are resting for longer periods, mix in some mild activity or stretching between periods of rest. This will help to prevent stiffness and pain. Get regular exercise. Ask your health care provider what activities are safe for you. Do not lift anything that is heavier than 10 lb (4.5 kg), or the limit that you are told, until your health care provider says that it is safe. Always use proper lifting technique, which includes: Bending your knees. Keeping the load close to your body. Avoiding twisting. Sleep on a firm mattress in a comfortable position. Try lying on your side with your knees slightly bent. If you lie on your back, put a pillow under your knees.  Medicines Treatment may include medicines for pain and inflammation taken by mouth or applied to the skin, prescription pain medicine, or muscle relaxants. Take over-the-counter and prescription medicines only as told by your health care provider. Ask your health care provider if the medicine prescribed to you: Requires you to avoid driving or using machinery. Can cause constipation. You may need to take these actions to prevent or treat constipation:  Drink enough fluid to keep your urine pale yellow. Take over-the-counter or prescription medicines. Eat foods that are high in fiber, such as beans, whole grains, and fresh fruits and vegetables. Limit foods that are high in fat and processed sugars, such as fried or sweet foods. General instructions Do not use any products that contain nicotine or tobacco, such as cigarettes, e-cigarettes, and chewing tobacco. If you need help quitting, ask your  health care provider. Keep all follow-up visits as told by your health care provider. This is important. Contact a health care provider if: You have pain that is not relieved with rest or medicine. Your pain gets worse, or you have new pain. You have a high fever. You have rapid weight loss. You have trouble doing your normal activities. Get help right away if: You have weakness or numbness in one or both of your legs or feet. You have trouble controlling your bladder or your bowels. You have severe back pain and have any of the following: Nausea or vomiting. Pain in your abdomen. Shortness of breath or you faint. Summary Chronic back pain is back pain that lasts longer than 3 months. When a flare-up begins, apply ice to the painful area for the first 24-48 hours. Apply a moist heat pad or use a heating pad on the painful area as directed by your health care provider. When you are resting for longer periods, mix in some mild activity or stretching between periods of rest. This will help to prevent stiffness and pain. This information is not intended to replace advice given to you by your health care provider. Make sure you discuss any questions you have with your healthcare provider. Document Revised: 08/30/2019 Document Reviewed: 08/30/2019 Elsevier Patient Education  2022 [employer].

## 2021-01-27 NOTE — Assessment & Plan Note (Signed)
She is struggling with daily pain and parents who are failing and dneed her help. She feels her medications are helping enough and does not want to change them

## 2021-01-27 NOTE — Assessment & Plan Note (Signed)
Supplement and monitor 

## 2021-01-27 NOTE — Assessment & Plan Note (Signed)
Encouraged cessation but she is still unwilling to attempt it

## 2021-01-28 ENCOUNTER — Ambulatory Visit (INDEPENDENT_AMBULATORY_CARE_PROVIDER_SITE_OTHER)
Admission: RE | Admit: 2021-01-28 | Discharge: 2021-01-28 | Disposition: A | Discharge status: Home / Self Care | Payer: Self-pay | Source: Ambulatory Visit | Attending: Family Medicine | Admitting: Family Medicine

## 2021-01-28 DIAGNOSIS — M545 Low back pain, unspecified: Secondary | ICD-10-CM

## 2021-01-28 DIAGNOSIS — G8929 Other chronic pain: Secondary | ICD-10-CM

## 2021-01-28 LAB — TSH: TSH: 3.21 u[IU]/mL (ref 0.35–4.50)

## 2021-01-28 LAB — LIPID PANEL
Cholesterol: 196 mg/dL (ref 0–200)
HDL: 60.3 mg/dL (ref >39.00)
LDL Cholesterol: 113 mg/dL — ABNORMAL HIGH (ref 0–99)
NonHDL: 135.3
Total CHOL/HDL Ratio: 3
Triglycerides: 111 mg/dL (ref 0.0–149.0)
VLDL: 22.2 mg/dL (ref 0.0–40.0)

## 2021-01-28 LAB — COMPREHENSIVE METABOLIC PANEL
ALT: 14 U/L (ref 0–35)
AST: 19 U/L (ref 0–37)
Albumin: 4.7 g/dL (ref 3.5–5.2)
Alkaline Phosphatase: 74 U/L (ref 39–117)
BUN: 11 mg/dL (ref 6–23)
CO2: 31 mEq/L (ref 19–32)
Calcium: 9.5 mg/dL (ref 8.4–10.5)
Chloride: 103 mEq/L (ref 96–112)
Creatinine, Ser: 0.99 mg/dL (ref 0.40–1.20)
GFR: 62.65 mL/min (ref >60.00)
Glucose, Bld: 67 mg/dL — ABNORMAL LOW (ref 70–99)
Potassium: 3.4 mEq/L — ABNORMAL LOW (ref 3.5–5.1)
Sodium: 140 mEq/L (ref 135–145)
Total Bilirubin: 0.3 mg/dL (ref 0.2–1.2)
Total Protein: 7.1 g/dL (ref 6.0–8.3)

## 2021-01-28 LAB — CBC
HCT: 38.2 % (ref 36.0–46.0)
Hemoglobin: 13.1 g/dL (ref 12.0–15.0)
MCHC: 34.4 g/dL (ref 30.0–36.0)
MCV: 91.7 fl (ref 78.0–100.0)
Platelets: 319 10*3/uL (ref 150.0–400.0)
RBC: 4.17 Mil/uL (ref 3.87–5.11)
RDW: 12.8 % (ref 11.5–15.5)
WBC: 7.9 10*3/uL (ref 4.0–10.5)

## 2021-01-28 LAB — VITAMIN D 25 HYDROXY (VIT D DEFICIENCY, FRACTURES): VITD: 100.26 ng/mL — ABNORMAL HIGH (ref 30.00–100.00)

## 2021-01-29 ENCOUNTER — Other Ambulatory Visit: Payer: Self-pay | Admitting: Family Medicine

## 2021-01-29 DIAGNOSIS — M545 Low back pain, unspecified: Secondary | ICD-10-CM

## 2021-02-08 ENCOUNTER — Other Ambulatory Visit: Payer: Self-pay

## 2021-02-08 ENCOUNTER — Ambulatory Visit (HOSPITAL_BASED_OUTPATIENT_CLINIC_OR_DEPARTMENT_OTHER)
Admission: RE | Admit: 2021-02-08 | Discharge: 2021-02-08 | Disposition: A | Discharge status: Home / Self Care | Payer: Self-pay | Source: Ambulatory Visit | Attending: Family Medicine | Admitting: Family Medicine

## 2021-02-08 DIAGNOSIS — M545 Low back pain, unspecified: Secondary | ICD-10-CM | POA: Insufficient documentation

## 2021-02-12 ENCOUNTER — Telehealth: Payer: Self-pay | Admitting: Family Medicine

## 2021-02-12 ENCOUNTER — Other Ambulatory Visit: Payer: Self-pay | Admitting: Family Medicine

## 2021-02-12 DIAGNOSIS — M545 Low back pain, unspecified: Secondary | ICD-10-CM

## 2021-02-12 DIAGNOSIS — M5136 Other intervertebral disc degeneration, lumbar region: Secondary | ICD-10-CM

## 2021-02-12 NOTE — Telephone Encounter (Signed)
Patient states she got a message on mychart to inform Dr. B that she wants referral to Ortho.

## 2021-02-12 NOTE — Telephone Encounter (Signed)
Message sent to provider for diagnosis.  She stated that she will go to [facility] provider.

## 2021-03-06 ENCOUNTER — Other Ambulatory Visit: Payer: Self-pay | Admitting: Family Medicine

## 2021-03-07 ENCOUNTER — Encounter: Payer: Self-pay | Admitting: Orthopaedic Surgery

## 2021-03-07 ENCOUNTER — Other Ambulatory Visit: Payer: Self-pay

## 2021-03-07 ENCOUNTER — Ambulatory Visit (INDEPENDENT_AMBULATORY_CARE_PROVIDER_SITE_OTHER): Payer: Self-pay | Admitting: Orthopaedic Surgery

## 2021-03-07 VITALS — BP 100/70 | HR 69 | Ht 61.0 in | Wt 110.0 lb

## 2021-03-07 DIAGNOSIS — M545 Low back pain, unspecified: Secondary | ICD-10-CM

## 2021-03-07 NOTE — Progress Notes (Signed)
Office Visit Note   Patient: Autumn Myers           Date of Birth: 23-Oct-1960           MRN: [id number] Visit Date: 03/07/2021              Requested by: Mosie Lukes, MD [address] [place],  [place] [address] PCP: Mosie Lukes, MD   Assessment & Plan: Visit Diagnoses:  1. Low back pain, unspecified back pain laterality, unspecified chronicity, unspecified whether sciatica present     Plan: We discussed therapy referral.  She can look in some insurance options if this not effective then possible epidural injection was discussed.  We reviewed the MRI gave her a copy of the report no areas that require operative intervention at this point.  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  Follow-Up Instructions: No follow-ups on file.   Orders:  No orders of the defined types were placed in this encounter.  No orders of the defined types were placed in this encounter.     Procedures: No procedures performed   Clinical Data: No additional findings.   Subjective: Chief Complaint  Patient presents with   Lower Back - Pain    HPI 60 year old female seen with chronic back pain for many years.  She is looking at some insurance options currently self-pay and states she has had increased pain for last 6 7 months some difficulty walking around in the store.  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  Pain with turning twisting walking standing.  Pain radiates from the back into the posterior right leg stops at the knee.  She has had some numbness and tingling in the toes at times.  She is taken tizanidine, ibuprofen, lidocaine patches all without relief.  No associated bowel or bladder symptoms.  Patient had MRI scan 02/10/2021 which showed some degenerative changes with some spurring and mild bulging at several levels no areas of significant compression.  Radiologist read this as degeneration of upper disc and lower facets  without impingement.  Review of Systems positive for room smoking history of vitamin D deficiency depression.  All other systems noncontributory HPI.   Objective: Vital Signs: BP 100/70   Pulse 69   Ht '5\' 1"'$  (1.549 m)   Wt 110 lb (49.9 kg)   LMP 11/16/2008   BMI 20.78 kg/m   Physical Exam Constitutional:      Appearance: She is well-developed.  HENT:     Head: Normocephalic.     Right Ear: External ear normal.     Left Ear: External ear normal. There is no impacted cerumen.  Eyes:     Pupils: Pupils are equal, round, and reactive to light.  Neck:     Thyroid: No thyromegaly.     Trachea: No tracheal deviation.  Cardiovascular:     Rate and Rhythm: Normal rate.  Pulmonary:     Effort: Pulmonary effort is normal.  Abdominal:     Palpations: Abdomen is soft.  Musculoskeletal:     Cervical back: No rigidity.  Skin:    General: Skin is warm and dry.  Neurological:     Mental Status: She is alert and oriented to person, place, and time.  Psychiatric:        Behavior: Behavior normal.    Ortho Exam patient has some tenderness on the right side right sciatic notch negative straight leg raising 90 degrees  knee and ankle jerk are intact she is able to heel and toe walk.  She has discomfort more with extension and forward flexion.  Specialty Comments:  No specialty comments available.  Imaging: CLINICAL DATA:  Low back pain for over 6 weeks with new right hip and posterior thigh pain. Right leg pain with activity   EXAM: MRI LUMBAR SPINE WITHOUT CONTRAST   TECHNIQUE: Multiplanar, multisequence MR imaging of the lumbar spine was performed. No intravenous contrast was administered.   COMPARISON:  01/28/2021 radiography   FINDINGS: Segmentation:  5 lumbar type vertebrae   Alignment:  Physiologic.   Vertebrae:  No fracture, evidence of discitis, or bone lesion.   Conus medullaris and cauda equina: Conus extends to the L1-2 level. Conus and cauda equina appear  normal.   Paraspinal and other soft tissues: Negative   Disc levels:   L1-L2: Disc desiccation and narrowing with shallow central protrusion. Ventral endplate spurring   624THL: Disc narrowing and bulging with annular fissuring   L3-L4: Mild disc desiccation and bulging   L4-L5: Degenerative facet spurring and mild disc bulging.   L5-S1:Moderate degenerative facet spurring and mild disc bulging.   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 No impingement to explain leg symptoms.     Electronically Signed   By: Monte Fantasia M.D.   On: 02/10/2021 08:36   PMFS History: Patient Active Problem List   Diagnosis Date Noted   Screening breast examination 08/31/2019   LLQ pain 08/09/2017   Low back pain 08/09/2017   Raynaud phenomenon 04/15/2014   Right flank pain 03/28/2014   Chest pain 03/28/2014   Personal history of colonic adenoma 06/23/2013   Neck pain 05/13/2013   Other and unspecified hyperlipidemia 05/13/2013   Abnormal results of thyroid function studies 01/28/2013   Hematuria 01/29/2012   Renal lithiasis 01/29/2012   Hip pain, right 01/29/2012   Allergic state 11/20/2011   Headache 11/20/2011   Myalgia 11/20/2011   Preventative health care 10/13/2011   Depression with anxiety    Vitamin D deficiency    Smoker    Past Medical History:  Diagnosis Date   Abnormal results of thyroid function studies 01/28/2013   Allergic state 11/20/2011   Allergy    seasonal   Anxiety    Cancer ([place])    basal cell   Chicken pox as a child   Depression    Depression with anxiety    Headache(784.0) 11/20/2011   Hematuria 01/29/2012   Hip pain, right 01/29/2012   Myalgia 11/20/2011   Neck pain 05/13/2013   Other and unspecified hyperlipidemia 05/13/2013   Personal history of colonic adenoma 06/23/2013   06/23/2013 2 dminutive polyps     Preventative health care 10/13/2011   Puncture wound of thigh, right 02/03/2012   Raynaud phenomenon 04/15/2014   Renal  lithiasis 01/29/2012   Sinusitis acute 01/29/2012   Sinusitis, acute 01/29/2012   Smoker    Vitamin D deficiency    Wound infection 06/20/2012    Family History  Problem Relation Age of Onset   Hypertension Mother    Parkinsonism Mother    Stroke Father    Hypertension Father    Cancer Father        leukemia   Colon polyps Father    Depression Sister    Anxiety disorder Sister    Colon polyps Sister    Heart disease Maternal Grandfather    Colon polyps Brother    Parkinsonism Maternal Grandmother  Colon cancer Neg Hx    Stomach cancer Neg Hx    Esophageal cancer Neg Hx    Rectal cancer Neg Hx     Past Surgical History:  Procedure Laterality Date   CARPAL TUNNEL RELEASE     on right and left   CESAREAN SECTION  1994   COLONOSCOPY  2014   COLONOSCOPY W/ POLYPECTOMY  2014   1 was precancerous   LITHOTRIPSY     POLYPECTOMY     Social History   Occupational History   Not on file  Tobacco Use   Smoking status: Every Day    Years: 15.00    Types: Cigarettes   Smokeless tobacco: Never   Tobacco comments:    less than a pack a day  Vaping Use   Vaping Use: Never used  Substance and Sexual Activity   Alcohol use: No   Drug use: No   Sexual activity: Not Currently    Partners: Male    Birth control/protection: None

## 2021-03-10 ENCOUNTER — Telehealth: Payer: Self-pay | Admitting: Orthopaedic Surgery

## 2021-03-10 NOTE — Telephone Encounter (Signed)
Pt called requesting a call back concerning referral epidural for back injections and which dr she will be seeing. She is asking about pricing for injection because she is self pay. Please call pt at [phone]

## 2021-03-11 ENCOUNTER — Other Ambulatory Visit: Payer: Self-pay

## 2021-03-11 MED ORDER — METHYLPREDNISOLONE 4 MG PO TBPK: ORAL_TABLET | ORAL | 0 refills | Status: DC

## 2021-03-11 NOTE — Telephone Encounter (Signed)
We do not have a referral for this patient. We need to have a referral so we know what code to give her pricing for.

## 2021-03-11 NOTE — Telephone Encounter (Signed)
Patient aware this was called in for her  

## 2021-07-21 ENCOUNTER — Ambulatory Visit: Payer: Self-pay | Admitting: Family Medicine

## 2021-08-03 IMAGING — MG MM DIGITAL SCREENING BILAT W/ TOMO AND CAD
8 series · 9 of 24 positions shown · non-contrast
Comparison: Previous exam(s).

CLINICAL DATA: Screening.

EXAM:
DIGITAL SCREENING BILATERAL MAMMOGRAM WITH TOMOSYNTHESIS AND CAD
TECHNIQUE: Bilateral screening digital craniocaudal and mediolateral oblique
mammograms were obtained. Bilateral screening digital breast
tomosynthesis was performed. The images were evaluated with
computer-aided detection.

[L CC synth-2D]
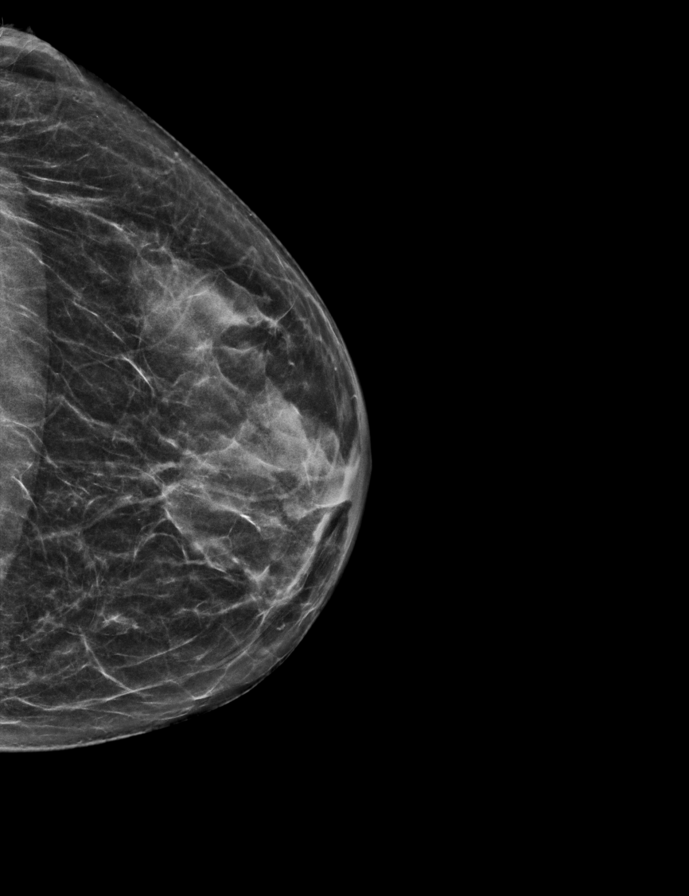

[R CC synth-2D]
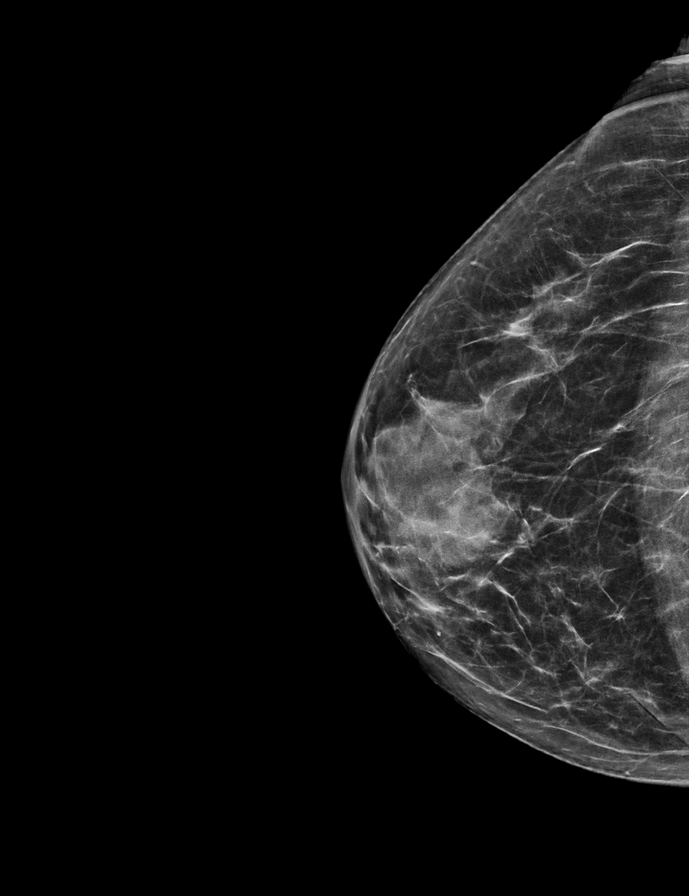

[R MLO synth-2D]
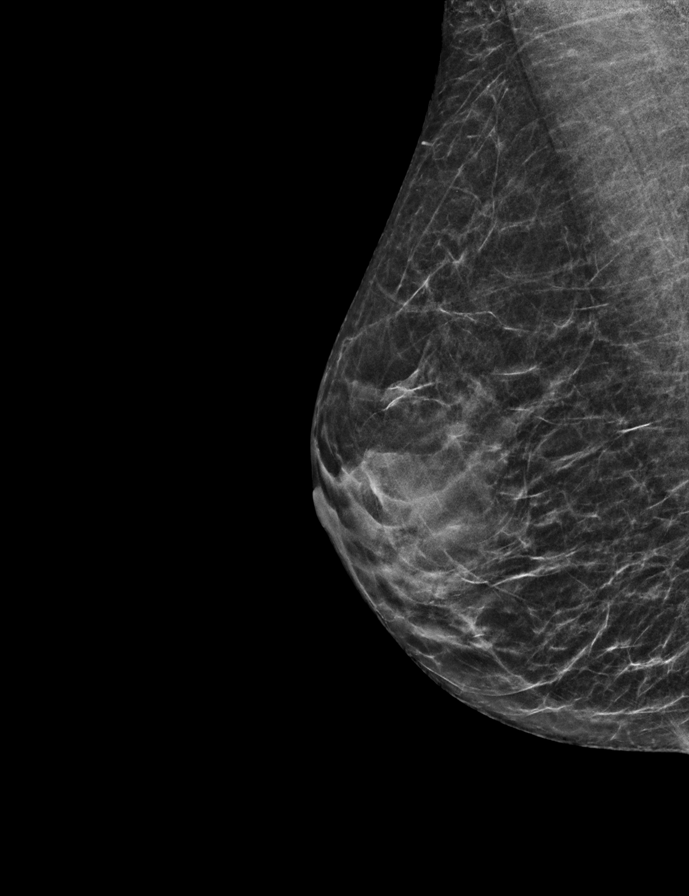

[L MLO synth-2D]
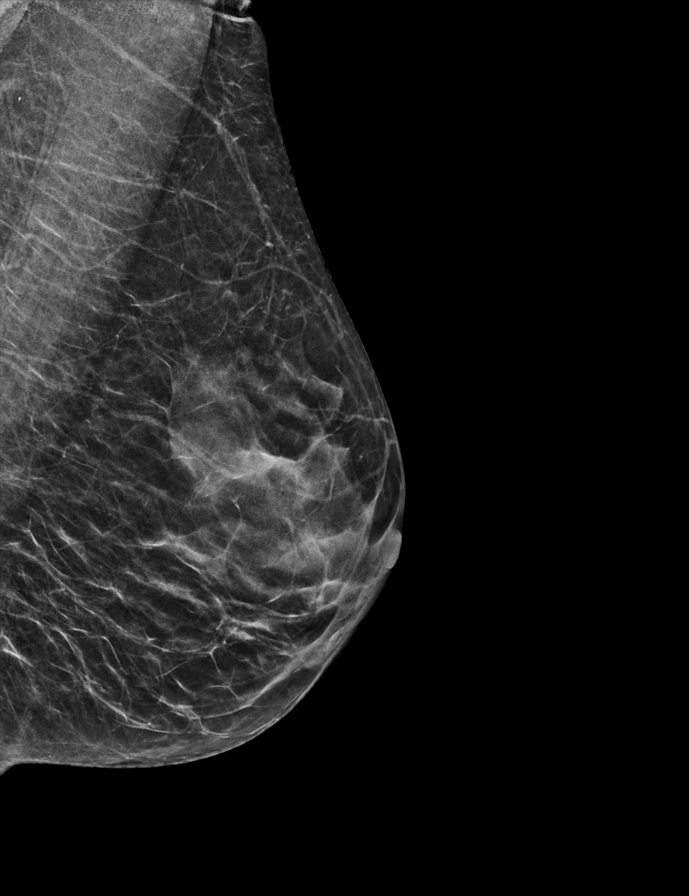

[R CC tomo · 2 of 54 frames shown]
[frame 18/54]
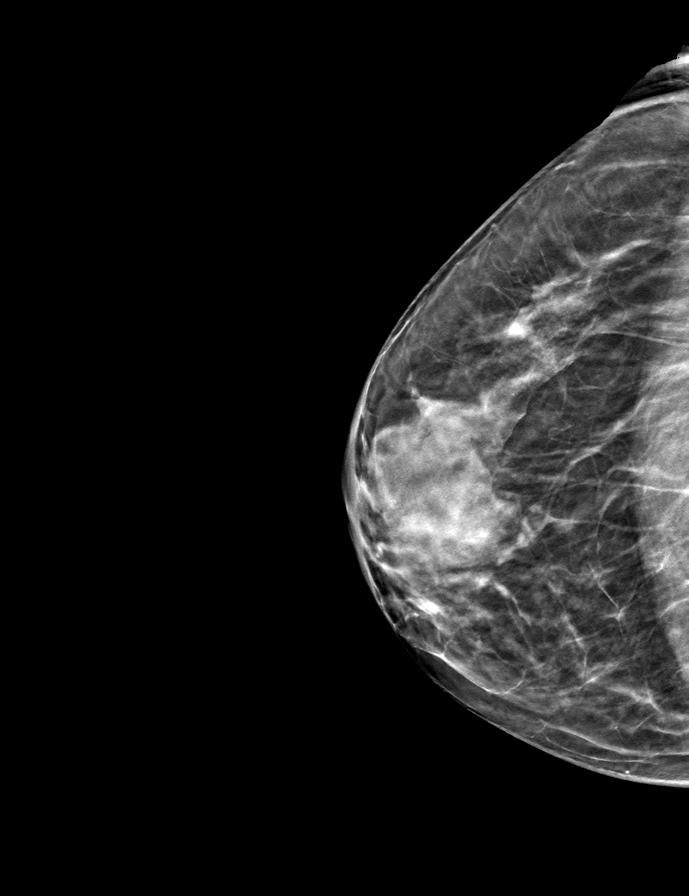
[frame 27/54]
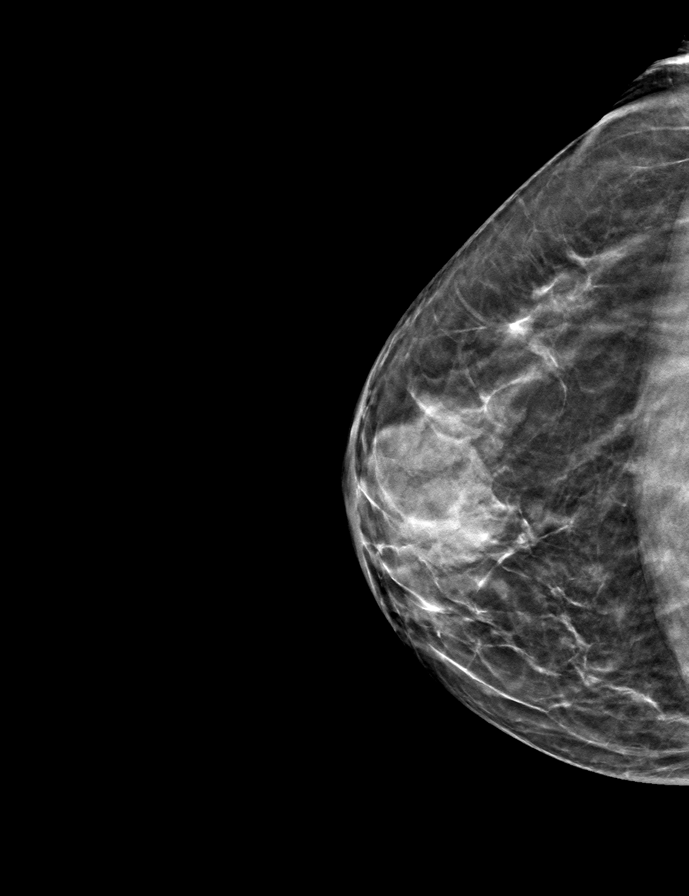

[L CC tomo · tomo slice 26/51.0]
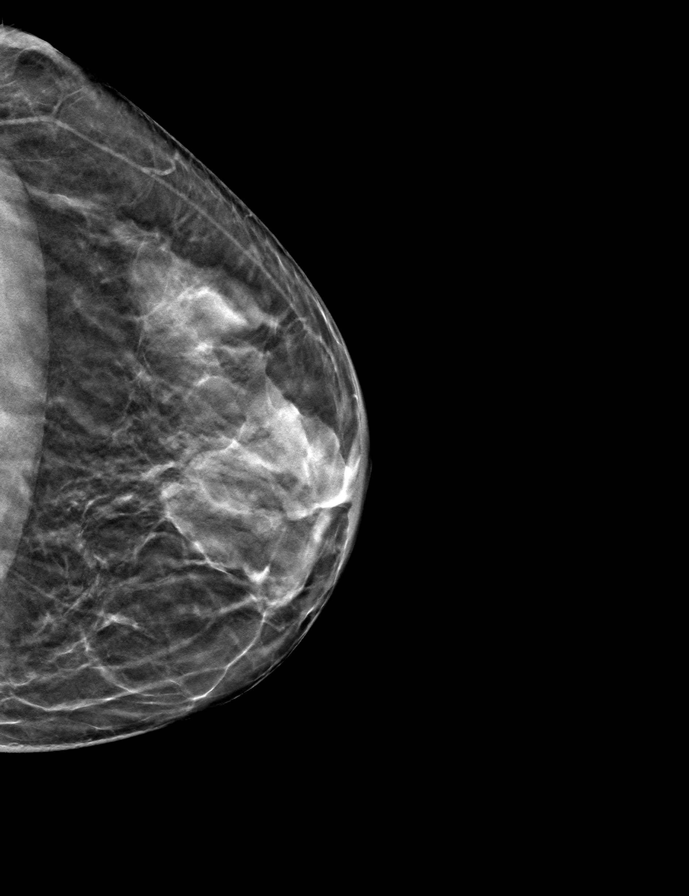

[R MLO tomo · tomo slice 22/43.0]
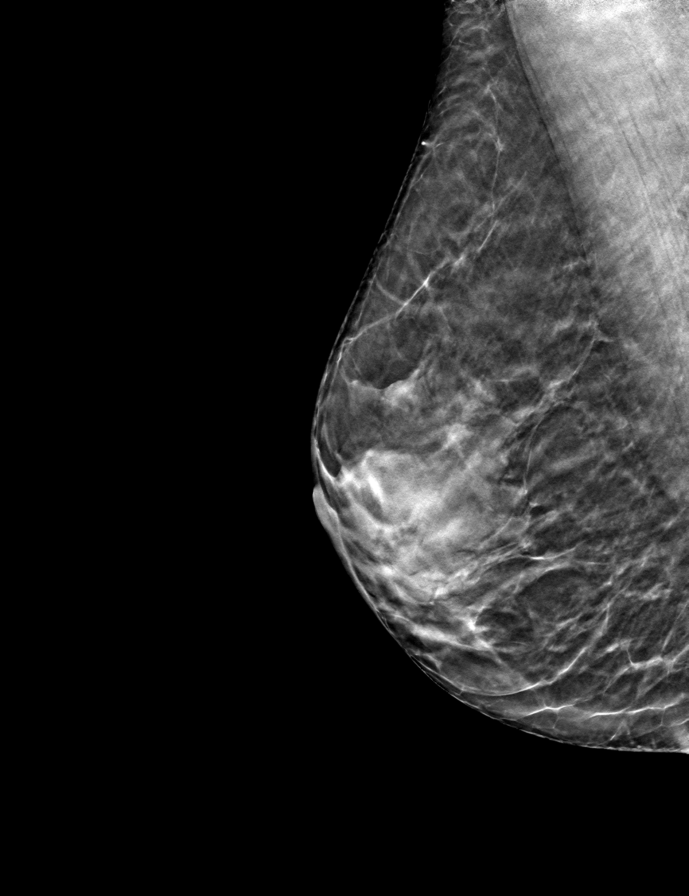

[L MLO tomo · tomo slice 23/45.0]
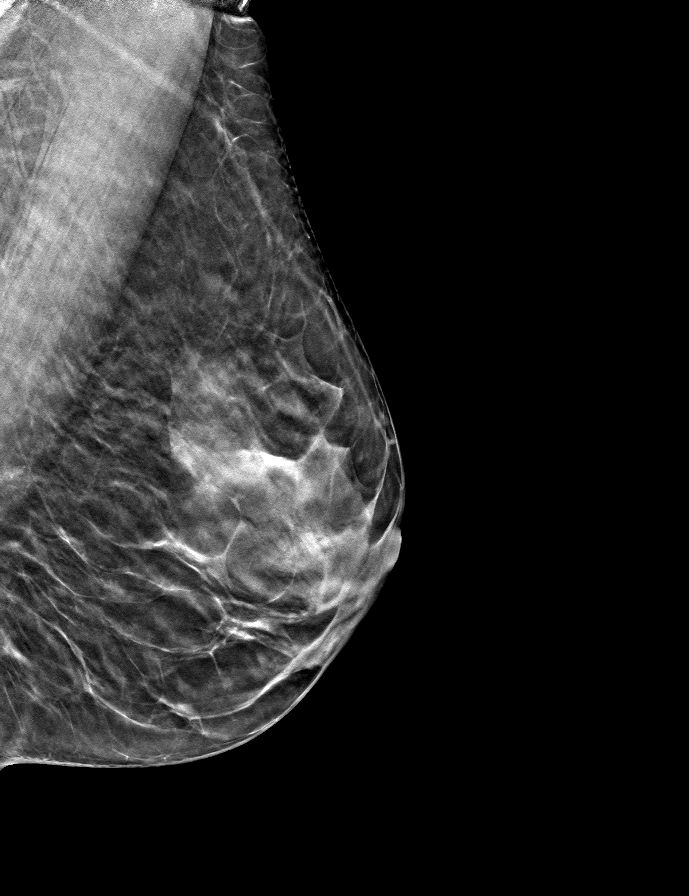

[9 of 24 positions shown; findings below may reference images not displayed]

ACR Breast Density Category c: The breast tissue is heterogeneously
dense, which may obscure small masses.
FINDINGS: There are no findings suspicious for malignancy. The images were
evaluated with computer-aided detection.
IMPRESSION: No mammographic evidence of malignancy. A result letter of this
screening mammogram will be mailed directly to the patient.

RECOMMENDATION:
Screening mammogram in one year. (Code:T4-5-GWO)

BI-RADS CATEGORY  1: Negative.

## 2021-08-08 ENCOUNTER — Telehealth: Payer: Self-pay | Admitting: Family Medicine

## 2021-08-08 NOTE — Telephone Encounter (Signed)
Pt called to get get her follow up rescheduled. Advised pt Dr. Jacinto Reap has a very busy schedule but she did have a cancellation on January 16th other than that it may be a couple months. She denied and said she has a job and couldn't get off. Also offered to get her in to see Melissa sooner, she denied and said she does not know her like Dr. B does, and every time another provider is involved it does not go well. She was very aggravated, cursing and getting loud over the phone. Asked for a message to be sent to Dr. B, because she knows her personally. She would like a call back because she is thinking of switching drs. Please advise.

## 2021-08-08 NOTE — Telephone Encounter (Signed)
Scheduled pt for 08/25/21

## 2021-08-22 ENCOUNTER — Telehealth: Payer: Self-pay

## 2021-08-22 NOTE — Telephone Encounter (Signed)
Pt called to cx her appointment for 08/25/21 d/t her father having surgery. I let her know that I would let you all know so that Dr Charlett Blake can check her schedule to see when she can be worked back in for her r/s.

## 2021-08-22 NOTE — Telephone Encounter (Signed)
scheduled

## 2021-08-25 ENCOUNTER — Ambulatory Visit: Payer: Self-pay | Admitting: Family Medicine

## 2021-08-27 ENCOUNTER — Other Ambulatory Visit: Payer: Self-pay | Admitting: Family Medicine

## 2021-08-28 ENCOUNTER — Other Ambulatory Visit: Payer: Self-pay | Admitting: Family Medicine

## 2021-11-24 ENCOUNTER — Ambulatory Visit (INDEPENDENT_AMBULATORY_CARE_PROVIDER_SITE_OTHER): Payer: 59 | Admitting: Family Medicine

## 2021-11-24 ENCOUNTER — Other Ambulatory Visit (HOSPITAL_BASED_OUTPATIENT_CLINIC_OR_DEPARTMENT_OTHER): Payer: Self-pay

## 2021-11-24 ENCOUNTER — Encounter: Payer: Self-pay | Admitting: Family Medicine

## 2021-11-24 ENCOUNTER — Telehealth: Payer: Self-pay

## 2021-11-24 VITALS — BP 108/70 | HR 82 | Temp 97.9°F | Resp 18 | Ht 61.0 in | Wt 107.2 lb

## 2021-11-24 DIAGNOSIS — T63301A Toxic effect of unspecified spider venom, accidental (unintentional), initial encounter: Secondary | ICD-10-CM | POA: Diagnosis not present

## 2021-11-24 DIAGNOSIS — H6012 Cellulitis of left external ear: Secondary | ICD-10-CM | POA: Diagnosis not present

## 2021-11-24 MED ORDER — DOXYCYCLINE HYCLATE 100 MG PO TABS
100.0000 mg | ORAL_TABLET | Freq: Two times a day (BID) | ORAL | 0 refills | Status: DC
  Filled 2021-11-24: qty 20, 10d supply, fill #0

## 2021-11-24 MED ORDER — METHYLPREDNISOLONE ACETATE 80 MG/ML IJ SUSP
80.0000 mg | Freq: Once | INTRAMUSCULAR | Status: AC
  Administered 2021-11-24: 80 mg via INTRAMUSCULAR

## 2021-11-24 MED ORDER — SULFAMETHOXAZOLE-TRIMETHOPRIM 800-160 MG PO TABS: 1.0000 | ORAL_TABLET | Freq: Two times a day (BID) | ORAL | 0 refills | Status: DC

## 2021-11-24 MED ORDER — DOXYCYCLINE HYCLATE 100 MG PO TABS: 100.0000 mg | ORAL_TABLET | Freq: Two times a day (BID) | ORAL | 0 refills | Status: DC

## 2021-11-24 NOTE — Assessment & Plan Note (Signed)
abx per orders  

## 2021-11-24 NOTE — Telephone Encounter (Signed)
Appt scheduled w/ Dr. Lowne.  

## 2021-11-24 NOTE — Patient Instructions (Signed)
Spider Bite Spider bites are not common. When spider bites do happen, most do not cause serious health problems. There are only a few types of spider bites that can cause serious health problems. What are the causes? A spider bite is often caused by a person accidentally making contact with a spider in a way that traps the spider against the skin. What increases the risk? You are more likely to be bitten by a spider if: You live in an area where spiders live, and you disturb their habitat. You work outdoors, such as a landscaper or farmer. You participate in certain outdoor activities, such as playing in leaves or hiking. What are the signs or symptoms? Symptoms may vary depending on the type of spider. Some spider bites may cause symptoms within 1 hour after the bite. For other spider bites, it may take 1-2 days for symptoms to develop. Common symptoms of this condition include: A raised area that is red. Redness and swelling around the area of the bite or bites. Discomfort or pain in the area of the bite. A few types of spiders, such as the black widow or the brown recluse, can inject poison (venom) into a bite wound. This venom causes more serious symptoms. Symptoms of a venomous spider bite vary and may include: Muscle cramps. Nausea, vomiting, or abdominal pain. A fever. A skin sore (lesion) that spreads. This can break into an open wound (skin ulcer). Light-headedness or dizziness. How is this diagnosed? This condition may be diagnosed based on your symptoms and a physical exam. Your health care provider will ask about the history of your injury and any details you may have about the spider. This may help to determine what type of spider bit you. How is this treated? Many spider bites do not require treatment. If needed, this condition may be treated by: Icing and keeping the bite area raised (elevated). Taking or applying over-the-counter or prescription medicines to help control  symptoms such as pain and itching. Having a tetanus shot, if needed. Taking antibiotic medicine. Follow these instructions at home: Medicines Take or apply over-the-counter and prescription medicines only as told by your health care provider. If you were prescribed an antibiotic medicine, take or apply it as told by your health care provider. Do not stop using the antibiotic even if you start to feel better or if your condition improves. Managing pain and swelling  If directed, put ice on the bite area. To do this: Put ice in a plastic bag. Place a towel between your skin and the bag. Leave the ice on for 20 minutes, 2-3 times a day. Remove the ice if your skin turns bright red. This is very important. If you cannot feel pain, heat, or cold, you have a greater risk of damage to the area. Elevate the bite area above the level of your heart while you are sitting or lying down. General instructions  Do not scratch the bite area. Keep the bite area clean and dry. Wash the bite area daily with soap and water as told by your health care provider. Keep all follow-up visits. This is important. Contact a health care provider if: Your bite does not get better after 3 days of treatment. Your bite turns black or purple. You have increased redness, swelling, or pain at the site of the bite. Get help right away if: You develop shortness of breath or chest pain. You have fluid, blood, or pus coming from the bite area. You have   muscle cramps or painful muscle spasms. You develop abdominal pain, nausea, or vomiting. You feel unusually tired (fatigued) or sleepy. These symptoms may represent a serious problem that is an emergency. Do not wait to see if the symptoms will go away. Get medical help right away. Call your local emergency services (911 in the [place] Do not drive yourself to the hospital. Summary Spider bites are not common. When spider bites do happen, most do not cause serious health  problems. Take or apply over-the-counter and prescription medicines only as told by your health care provider. Keep the bite area clean and dry. Wash the bite area daily with soap and water as told by your health care provider. Contact a health care provider if you have increased redness, swelling, or pain at the site of the bite. Get help right away if you develop shortness of breath or chest pain. This information is not intended to replace advice given to you by your health care provider. Make sure you discuss any questions you have with your health care provider. Document Revised: 05/08/2020 Document Reviewed: 05/08/2020 Elsevier Patient Education  2023 [employer]  

## 2021-11-24 NOTE — Telephone Encounter (Signed)
Nurse Assessment ?Nurse: Toribio Harbour, RN, Joelene Millin Date/Time (Eastern Time): 11/24/2021 1:34:10 PM ?Confirm and document reason for call. If ?symptomatic, describe symptoms. ?---Caller states she she was bitten by a spider on her ?left ear last week and on Friday she had chills. It was ?swollen and painful. She is now having redness and ?soreness on her neck and she is feeling numbness when ?she eats on the back of her tongue. The entire area is ?warm and red. Denies fever now but does think she ran ?a fever over the weekend. ?Does the patient have any new or worsening ?symptoms? ---Yes ?Will a triage be completed? ---Yes ?Related visit to physician within the last 2 weeks? ---No ?Does the PT have any chronic conditions? (i.e. ?diabetes, asthma, this includes High risk factors for ?pregnancy, etc.) ?---Yes ?List chronic conditions. ---anxiety ?Is this a behavioral health or substance abuse call? ---No ?Guidelines ?Guideline Title Affirmed Question Affirmed Notes Nurse Date/Time (Eastern ?Time) ?Spider Bite - [place] ?[1] Red streak or red ?line AND [2] length ?> 2 inches (5 cm) ?Toribio Harbour, RN, ?Joelene Millin ?11/24/2021 1:35:50 ?PM ?PLEASE NOTE: All timestamps contained within this report are represented as [place] Standard Time. ?CONFIDENTIALTY NOTICE: This fax transmission is intended only for the addressee. It contains information that is legally privileged, confidential or ?otherwise protected from use or disclosure. If you are not the intended recipient, you are strictly prohibited from reviewing, disclosing, copying using ?or disseminating any of this information or taking any action in reliance on or regarding this information. If you have received this fax in error, please ?notify us immediately by telephone so that we can arrange for its return to [place]. Phone: [phone], Toll-Free: [phone], Fax: [phone] ?Page: 2 of 2 ?Call Id: [id number] ?Disp. Time (Eastern ?Time) Disposition Final User ?11/24/2021 1:42:42  PM See HCP within 4 Hours (or ?PCP triage) ?Yes Toribio Harbour, RN, Joelene Millin ?Caller Disagree/Comply Comply ?Caller Understands Yes ?PreDisposition Call Doctor ?Care Advice Given Per Guideline ?SEE HCP (OR PCP TRIAGE) WITHIN 4 HOURS: * IF OFFICE WILL BE OPEN: You need to be seen within the next 3 or 4 ?hours. Call your doctor (or NP/PA) now or as soon as the office opens. CALL BACK IF: * You become worse CARE ADVICE ?given per Spider Bite - [place] (Adult) guideline. ?Comments ?User: Devoria Albe, RN Date/Time Eilene Ghazi Time): 11/24/2021 1:43:22 PM ?No answer on backline. Advised to call back to schedule or go to [facility]. ?Referrals ?REFERRED TO PCP OFFICE ?

## 2021-11-24 NOTE — Assessment & Plan Note (Signed)
Depo medrol ?Can con't benadryl prn  ?rto prn  ?

## 2021-11-24 NOTE — Progress Notes (Addendum)
? ?Subjective:  ? ?By signing my name below, I, Carylon Perches, attest that this documentation has been prepared under the direction and in the presence of Roma Schanz DO, 11/24/2021  ? ? Patient ID: Autumn Myers, female    DOB: 1960-10-15, 61 y.o.   MRN: [id number] ? ?Chief Complaint  ?Patient presents with  ? Insect Bite  ?  Left ear, bite occurred x1 week ago. Pt states having pain, chills, fever, and swelling.   ? ? ?HPI ?Patient is in today for an office visit ? ?She complains of symptoms that begun because of a spider bite. She was bit on 11/17/2021 and begun to develop chills on 11/21/2021. She felt sick on 11/22/2021. As of today's visit, her neck is sore, the backside of her tongue gets numb when she eats and she has head pain. Her left ear and the left side of her neck is tender. She has been taking children benadryl. She denies of any fullness in her ears.  ? ? ?Past Medical History:  ?Diagnosis Date  ? Abnormal results of thyroid function studies 01/28/2013  ? Allergic state 11/20/2011  ? Allergy   ? seasonal  ? Anxiety   ? Cancer [facility])   ? basal cell  ? Chicken pox as a child  ? Depression   ? Depression with anxiety   ? Headache(784.0) 11/20/2011  ? Hematuria 01/29/2012  ? Hip pain, right 01/29/2012  ? Myalgia 11/20/2011  ? Neck pain 05/13/2013  ? Other and unspecified hyperlipidemia 05/13/2013  ? Personal history of colonic adenoma 06/23/2013  ? 06/23/2013 2 dminutive polyps    ? Preventative health care 10/13/2011  ? Puncture wound of thigh, right 02/03/2012  ? Raynaud phenomenon 04/15/2014  ? Renal lithiasis 01/29/2012  ? Sinusitis acute 01/29/2012  ? Sinusitis, acute 01/29/2012  ? Smoker   ? Vitamin D deficiency   ? Wound infection 06/20/2012  ? ? ?Past Surgical History:  ?Procedure Laterality Date  ? CARPAL TUNNEL RELEASE    ? on right and left  ? [place]  ? COLONOSCOPY  2014  ? COLONOSCOPY W/ POLYPECTOMY  2014  ? 1 was precancerous  ? LITHOTRIPSY    ? POLYPECTOMY    ? ? ?Family History   ?Problem Relation Age of Onset  ? Hypertension Mother   ? Parkinsonism Mother   ? Stroke Father   ? Hypertension Father   ? Cancer Father   ?     leukemia  ? Colon polyps Father   ? Depression Sister   ? Anxiety disorder Sister   ? Colon polyps Sister   ? Heart disease Maternal Grandfather   ? Colon polyps Brother   ? Parkinsonism Maternal Grandmother   ? Colon cancer Neg Hx   ? Stomach cancer Neg Hx   ? Esophageal cancer Neg Hx   ? Rectal cancer Neg Hx   ? ? ?Social History  ? ?Socioeconomic History  ? Marital status: Divorced  ?  Spouse name: Not on file  ? Number of children: 2  ? Years of education: Not on file  ? Highest education level: High school graduate  ?Occupational History  ? Not on file  ?Tobacco Use  ? Smoking status: Every Day  ?  Years: 15.00  ?  Types: Cigarettes  ? Smokeless tobacco: Never  ? Tobacco comments:  ?  less than a pack a day  ?Vaping Use  ? Vaping Use: Never used  ?Substance and Sexual Activity  ?  Alcohol use: No  ? Drug use: No  ? Sexual activity: Not Currently  ?  Partners: Male  ?  Birth control/protection: None  ?Other Topics Concern  ? Not on file  ?Social History Narrative  ? Not on file  ? ?Social Determinants of Health  ? ?Financial Resource Strain: Not on file  ?Food Insecurity: Not on file  ?Transportation Needs: No Transportation Needs  ? Lack of Transportation (Medical): No  ? Lack of Transportation (Non-Medical): No  ?Physical Activity: Not on file  ?Stress: Not on file  ?Social Connections: Not on file  ?Intimate Partner Violence: Not on file  ? ? ?Outpatient Medications Prior to Visit  ?Medication Sig Dispense Refill  ? Biotin 10000 MCG TABS Take 1 tablet by mouth daily.    ? busPIRone (BUSPAR) 10 MG tablet TAKE 1 TABLET BY MOUTH THREE TIMES DAILY 270 tablet 1  ? cholecalciferol (VITAMIN D) 1000 units tablet Take 5,000 Units by mouth daily.     ? citalopram (CELEXA) 40 MG tablet Take 1 tablet by mouth once daily 90 tablet 1  ? cyclobenzaprine (FLEXERIL) 10 MG tablet  TAKE ONE TABLET BY MOUTH ONCE DAILY AT BEDTIME AS NEEDED FOR MUSCLE SPASM 90 tablet 1  ? loratadine (CLARITIN) 10 MG tablet Take 10 mg by mouth daily.    ? methylPREDNISolone (MEDROL DOSEPAK) 4 MG TBPK tablet Take as directed 21 tablet 0  ? Multiple Vitamin (MULTIVITAMIN) tablet Take 1 tablet by mouth daily.    ? tiZANidine (ZANAFLEX) 2 MG tablet TAKE 0.5-2 TABLETS BY MOUTH EVERY 8 HOURS AS NEEDED FOR MUSCLE SPASMS 40 tablet 0  ? ?No facility-administered medications prior to visit.  ? ? ?Allergies  ?Allergen Reactions  ? Eggs Or Egg-Derived Products Nausea And Vomiting  ?  cramping  ? Neosporin [Neomycin-Bacitracin Zn-Polymyx] Itching  ?  Inflamed, itchy   ? Penicillins Hives  ? Codeine   ?  GI upset  ? ? ?Review of Systems  ?HENT:  Positive for ear pain (Left Ear).   ?     (+) Tongue Numbness  ?Musculoskeletal:  Positive for neck pain (Left Side).  ?Neurological:  Positive for headaches.  ? ?   ?Objective:  ?  ?Physical Exam ?Vitals and nursing note reviewed.  ?Constitutional:   ?   General: She is not in acute distress. ?   Appearance: She is not ill-appearing.  ?HENT:  ?   Head: Normocephalic and atraumatic.  ?   Right Ear: External ear normal.  ?   Left Ear: External ear normal.  ?   Ears:  ?   Comments: Ext l ear--- + scab ,c/w insect bite  ?Tender to touch ?Ext er red and swollen , tender  ?Eyes:  ?   Extraocular Movements: Extraocular movements intact.  ?   Pupils: Pupils are equal, round, and reactive to light.  ?Neck:  ? ?   Comments: L side neck tender to touch ?+ errythem  ?Cardiovascular:  ?   Rate and Rhythm: Normal rate and regular rhythm.  ?   Heart sounds: Normal heart sounds. No murmur heard. ?  No gallop.  ?Pulmonary:  ?   Effort: Pulmonary effort is normal. No respiratory distress.  ?   Breath sounds: Normal breath sounds. No wheezing or rales.  ?Lymphadenopathy:  ?   Cervical: Cervical adenopathy present.  ?   Left cervical: Superficial cervical adenopathy present.  ?Skin: ?   General: Skin is  warm and dry.  ?Neurological:  ?  Mental Status: She is oriented to person, place, and time.  ?Psychiatric:     ?   Judgment: Judgment normal.  ? ? ?BP 108/70 (BP Location: Left Arm, Patient Position: Sitting, Cuff Size: Normal)   Pulse 82   Temp 97.9 ?F (36.6 ?C) (Oral)   Resp 18   Ht '5\' 1"'$  (1.549 m)   Wt 107 lb 3.2 oz (48.6 kg)   LMP 11/16/2008   SpO2 98%   BMI 20.26 kg/m?  ?Wt Readings from Last 3 Encounters:  ?11/24/21 107 lb 3.2 oz (48.6 kg)  ?03/07/21 110 lb (49.9 kg)  ?01/27/21 113 lb (51.3 kg)  ? ? ?Diabetic Foot Exam - Simple   ?No data filed ?  ? ?Lab Results  ?Component Value Date  ? WBC 7.9 01/27/2021  ? HGB 13.1 01/27/2021  ? HCT 38.2 01/27/2021  ? PLT 319.0 01/27/2021  ? GLUCOSE 67 (L) 01/27/2021  ? CHOL 196 01/27/2021  ? TRIG 111.0 01/27/2021  ? HDL 60.30 01/27/2021  ? LDLCALC 113 (H) 01/27/2021  ? ALT 14 01/27/2021  ? AST 19 01/27/2021  ? NA 140 01/27/2021  ? K 3.4 (L) 01/27/2021  ? CL 103 01/27/2021  ? CREATININE 0.99 01/27/2021  ? BUN 11 01/27/2021  ? CO2 31 01/27/2021  ? TSH 3.21 01/27/2021  ? ? ?Lab Results  ?Component Value Date  ? TSH 3.21 01/27/2021  ? ?Lab Results  ?Component Value Date  ? WBC 7.9 01/27/2021  ? HGB 13.1 01/27/2021  ? HCT 38.2 01/27/2021  ? MCV 91.7 01/27/2021  ? PLT 319.0 01/27/2021  ? ?Lab Results  ?Component Value Date  ? NA 140 01/27/2021  ? K 3.4 (L) 01/27/2021  ? CO2 31 01/27/2021  ? GLUCOSE 67 (L) 01/27/2021  ? BUN 11 01/27/2021  ? CREATININE 0.99 01/27/2021  ? BILITOT 0.3 01/27/2021  ? ALKPHOS 74 01/27/2021  ? AST 19 01/27/2021  ? ALT 14 01/27/2021  ? PROT 7.1 01/27/2021  ? ALBUMIN 4.7 01/27/2021  ? CALCIUM 9.5 01/27/2021  ? GFR 62.65 01/27/2021  ? ?Lab Results  ?Component Value Date  ? CHOL 196 01/27/2021  ? ?Lab Results  ?Component Value Date  ? HDL 60.30 01/27/2021  ? ?Lab Results  ?Component Value Date  ? LDLCALC 113 (H) 01/27/2021  ? ?Lab Results  ?Component Value Date  ? TRIG 111.0 01/27/2021  ? ?Lab Results  ?Component Value Date  ? CHOLHDL 3 01/27/2021   ? ?No results found for: HGBA1C ? ?   ?Assessment & Plan:  ? ?Problem List Items Addressed This Visit   ? ?  ? Unprioritized  ? Cellulitis of antihelix of left ear  ?  abx per orders  ? ? ?  ?  ? Allergic react

## 2021-11-25 ENCOUNTER — Other Ambulatory Visit: Payer: Self-pay | Admitting: Family Medicine

## 2021-11-25 DIAGNOSIS — R7989 Other specified abnormal findings of blood chemistry: Secondary | ICD-10-CM

## 2021-11-25 DIAGNOSIS — R748 Abnormal levels of other serum enzymes: Secondary | ICD-10-CM

## 2021-11-25 LAB — CBC WITH DIFFERENTIAL/PLATELET
Basophils Absolute: 0 10*3/uL (ref 0.0–0.1)
Basophils Relative: 0.7 % (ref 0.0–3.0)
Eosinophils Absolute: 0.1 10*3/uL (ref 0.0–0.7)
Eosinophils Relative: 1.5 % (ref 0.0–5.0)
HCT: 37.7 % (ref 36.0–46.0)
Hemoglobin: 12.8 g/dL (ref 12.0–15.0)
Lymphocytes Relative: 17.3 % (ref 12.0–46.0)
Lymphs Abs: 1.1 10*3/uL (ref 0.7–4.0)
MCHC: 33.8 g/dL (ref 30.0–36.0)
MCV: 91.9 fl (ref 78.0–100.0)
Monocytes Absolute: 0.4 10*3/uL (ref 0.1–1.0)
Monocytes Relative: 6.5 % (ref 3.0–12.0)
Neutro Abs: 4.6 10*3/uL (ref 1.4–7.7)
Neutrophils Relative %: 74 % (ref 43.0–77.0)
Platelets: 272 10*3/uL (ref 150.0–400.0)
RBC: 4.11 Mil/uL (ref 3.87–5.11)
RDW: 12.6 % (ref 11.5–15.5)
WBC: 6.2 10*3/uL (ref 4.0–10.5)

## 2021-11-25 LAB — COMPREHENSIVE METABOLIC PANEL
ALT: 105 U/L — ABNORMAL HIGH (ref 0–35)
AST: 108 U/L — ABNORMAL HIGH (ref 0–37)
Albumin: 4.5 g/dL (ref 3.5–5.2)
Alkaline Phosphatase: 201 U/L — ABNORMAL HIGH (ref 39–117)
BUN: 7 mg/dL (ref 6–23)
CO2: 28 mEq/L (ref 19–32)
Calcium: 9.1 mg/dL (ref 8.4–10.5)
Chloride: 102 mEq/L (ref 96–112)
Creatinine, Ser: 0.89 mg/dL (ref 0.40–1.20)
GFR: 70.78 mL/min (ref >60.00)
Glucose, Bld: 89 mg/dL (ref 70–99)
Potassium: 3.6 mEq/L (ref 3.5–5.1)
Sodium: 138 mEq/L (ref 135–145)
Total Bilirubin: 0.5 mg/dL (ref 0.2–1.2)
Total Protein: 6.8 g/dL (ref 6.0–8.3)

## 2021-11-27 ENCOUNTER — Ambulatory Visit (HOSPITAL_BASED_OUTPATIENT_CLINIC_OR_DEPARTMENT_OTHER)
Admission: RE | Admit: 2021-11-27 | Discharge: 2021-11-27 | Disposition: A | Discharge status: Home / Self Care | Payer: 59 | Source: Ambulatory Visit | Attending: Family Medicine | Admitting: Family Medicine

## 2021-11-27 ENCOUNTER — Other Ambulatory Visit (INDEPENDENT_AMBULATORY_CARE_PROVIDER_SITE_OTHER): Payer: 59

## 2021-11-27 DIAGNOSIS — R748 Abnormal levels of other serum enzymes: Secondary | ICD-10-CM | POA: Insufficient documentation

## 2021-11-27 DIAGNOSIS — R7989 Other specified abnormal findings of blood chemistry: Secondary | ICD-10-CM | POA: Diagnosis not present

## 2021-11-27 LAB — COMPREHENSIVE METABOLIC PANEL
ALT: 69 U/L — ABNORMAL HIGH (ref 0–35)
AST: 30 U/L (ref 0–37)
Albumin: 4.6 g/dL (ref 3.5–5.2)
Alkaline Phosphatase: 195 U/L — ABNORMAL HIGH (ref 39–117)
BUN: 10 mg/dL (ref 6–23)
CO2: 27 mEq/L (ref 19–32)
Calcium: 9.5 mg/dL (ref 8.4–10.5)
Chloride: 103 mEq/L (ref 96–112)
Creatinine, Ser: 0.98 mg/dL (ref 0.40–1.20)
GFR: 63.05 mL/min (ref >60.00)
Glucose, Bld: 92 mg/dL (ref 70–99)
Potassium: 4 mEq/L (ref 3.5–5.1)
Sodium: 140 mEq/L (ref 135–145)
Total Bilirubin: 0.4 mg/dL (ref 0.2–1.2)
Total Protein: 7 g/dL (ref 6.0–8.3)

## 2021-11-27 LAB — CBC WITH DIFFERENTIAL/PLATELET
Basophils Absolute: 0.1 10*3/uL (ref 0.0–0.1)
Basophils Relative: 0.8 % (ref 0.0–3.0)
Eosinophils Absolute: 0.2 10*3/uL (ref 0.0–0.7)
Eosinophils Relative: 2.2 % (ref 0.0–5.0)
HCT: 37.7 % (ref 36.0–46.0)
Hemoglobin: 13.1 g/dL (ref 12.0–15.0)
Lymphocytes Relative: 24.7 % (ref 12.0–46.0)
Lymphs Abs: 1.7 10*3/uL (ref 0.7–4.0)
MCHC: 34.7 g/dL (ref 30.0–36.0)
MCV: 92.3 fl (ref 78.0–100.0)
Monocytes Absolute: 0.5 10*3/uL (ref 0.1–1.0)
Monocytes Relative: 7 % (ref 3.0–12.0)
Neutro Abs: 4.5 10*3/uL (ref 1.4–7.7)
Neutrophils Relative %: 65.3 % (ref 43.0–77.0)
Platelets: 353 10*3/uL (ref 150.0–400.0)
RBC: 4.08 Mil/uL (ref 3.87–5.11)
RDW: 12.9 % (ref 11.5–15.5)
WBC: 6.9 10*3/uL (ref 4.0–10.5)

## 2021-11-27 LAB — GAMMA GT: GGT: 138 U/L — ABNORMAL HIGH (ref 7–51)

## 2021-11-28 ENCOUNTER — Other Ambulatory Visit: Payer: 59

## 2021-12-02 ENCOUNTER — Telehealth: Payer: Self-pay | Admitting: Family Medicine

## 2021-12-02 NOTE — Telephone Encounter (Signed)
Pt had labs done on 11/27/21.  Are you able to review? ?

## 2021-12-02 NOTE — Telephone Encounter (Signed)
Patient notified of results.

## 2021-12-02 NOTE — Telephone Encounter (Signed)
Patient would like for someone to call her back so she can go over labs results.  ?

## 2021-12-03 NOTE — Progress Notes (Signed)
? ?Subjective:  ? ? Patient ID: Autumn Myers, female    DOB: 1960-12-17, 61 y.o.   MRN: [id number] ? ?Chief Complaint  ?Patient presents with  ? Anxiety  ? Abnomal liver labs  ? ? ?HPI ?Patient is in today for anxiety and follow up on chronic medical concerns. she has been under a great deal of stress and a friend of hers was recently diagnosed with a GI cancer so her recent trouble with elevated liver markers makes her very anxious. She notes her diarrhea while mildly improved persists with several loose stool daily. No bloody or tarry stool. No fevers or chills. Denies CP/palp/SOB/HA/congestion/fevers or GU c/o. Taking meds as prescribed  ? ?Past Medical History:  ?Diagnosis Date  ? Abnormal results of thyroid function studies 01/28/2013  ? Allergic state 11/20/2011  ? Allergy   ? seasonal  ? Anxiety   ? Cancer [facility])   ? basal cell  ? Chicken pox as a child  ? Depression   ? Depression with anxiety   ? Headache(784.0) 11/20/2011  ? Hematuria 01/29/2012  ? Hip pain, right 01/29/2012  ? Myalgia 11/20/2011  ? Neck pain 05/13/2013  ? Other and unspecified hyperlipidemia 05/13/2013  ? Personal history of colonic adenoma 06/23/2013  ? 06/23/2013 2 dminutive polyps    ? Preventative health care 10/13/2011  ? Puncture wound of thigh, right 02/03/2012  ? Raynaud phenomenon 04/15/2014  ? Renal lithiasis 01/29/2012  ? Sinusitis acute 01/29/2012  ? Sinusitis, acute 01/29/2012  ? Smoker   ? Vitamin D deficiency   ? Wound infection 06/20/2012  ? ? ?Past Surgical History:  ?Procedure Laterality Date  ? CARPAL TUNNEL RELEASE    ? on right and left  ? [place]  ? COLONOSCOPY  2014  ? COLONOSCOPY W/ POLYPECTOMY  2014  ? 1 was precancerous  ? LITHOTRIPSY    ? POLYPECTOMY    ? ? ?Family History  ?Problem Relation Age of Onset  ? Hypertension Mother   ? Parkinsonism Mother   ? Stroke Father   ? Hypertension Father   ? Cancer Father   ?     leukemia  ? Colon polyps Father   ? Depression Sister   ? Anxiety disorder Sister   ? Colon  polyps Sister   ? Heart disease Maternal Grandfather   ? Colon polyps Brother   ? Parkinsonism Maternal Grandmother   ? Colon cancer Neg Hx   ? Stomach cancer Neg Hx   ? Esophageal cancer Neg Hx   ? Rectal cancer Neg Hx   ? ? ?Social History  ? ?Socioeconomic History  ? Marital status: Divorced  ?  Spouse name: Not on file  ? Number of children: 2  ? Years of education: Not on file  ? Highest education level: High school graduate  ?Occupational History  ? Not on file  ?Tobacco Use  ? Smoking status: Every Day  ?  Years: 15.00  ?  Types: Cigarettes  ? Smokeless tobacco: Never  ? Tobacco comments:  ?  less than a pack a day  ?Vaping Use  ? Vaping Use: Never used  ?Substance and Sexual Activity  ? Alcohol use: No  ? Drug use: No  ? Sexual activity: Not Currently  ?  Partners: Male  ?  Birth control/protection: None  ?Other Topics Concern  ? Not on file  ?Social History Narrative  ? Not on file  ? ?Social Determinants of Health  ? ?Emergency planning/management officer  Strain: Not on file  ?Food Insecurity: Not on file  ?Transportation Needs: No Transportation Needs  ? Lack of Transportation (Medical): No  ? Lack of Transportation (Non-Medical): No  ?Physical Activity: Not on file  ?Stress: Not on file  ?Social Connections: Not on file  ?Intimate Partner Violence: Not on file  ? ? ?Outpatient Medications Prior to Visit  ?Medication Sig Dispense Refill  ? Biotin 10000 MCG TABS Take 1 tablet by mouth daily.    ? citalopram (CELEXA) 40 MG tablet Take 1 tablet by mouth once daily 90 tablet 1  ? cyclobenzaprine (FLEXERIL) 10 MG tablet TAKE ONE TABLET BY MOUTH ONCE DAILY AT BEDTIME AS NEEDED FOR MUSCLE SPASM 90 tablet 1  ? loratadine (CLARITIN) 10 MG tablet Take 10 mg by mouth daily.    ? Multiple Vitamin (MULTIVITAMIN) tablet Take 1 tablet by mouth daily.    ? sulfamethoxazole-trimethoprim (BACTRIM DS) 800-160 MG tablet Take 1 tablet by mouth 2 (two) times daily. 20 tablet 0  ? tiZANidine (ZANAFLEX) 2 MG tablet TAKE 0.5-2 TABLETS BY MOUTH  EVERY 8 HOURS AS NEEDED FOR MUSCLE SPASMS 40 tablet 0  ? busPIRone (BUSPAR) 10 MG tablet TAKE 1 TABLET BY MOUTH THREE TIMES DAILY 270 tablet 1  ? cholecalciferol (VITAMIN D) 1000 units tablet Take 5,000 Units by mouth daily.     ? methylPREDNISolone (MEDROL DOSEPAK) 4 MG TBPK tablet Take as directed 21 tablet 0  ? ?No facility-administered medications prior to visit.  ? ? ?Allergies  ?Allergen Reactions  ? Eggs Or Egg-Derived Products Nausea And Vomiting  ?  cramping  ? Neosporin [Neomycin-Bacitracin Zn-Polymyx] Itching  ?  Inflamed, itchy   ? Penicillins Hives  ? Codeine   ?  GI upset  ? ? ?Review of Systems  ?Constitutional:  Positive for malaise/fatigue. Negative for fever.  ?HENT:  Negative for congestion.   ?Eyes:  Negative for blurred vision.  ?Respiratory:  Negative for shortness of breath.   ?Cardiovascular:  Negative for chest pain, palpitations and leg swelling.  ?Gastrointestinal:  Positive for diarrhea. Negative for abdominal pain, blood in stool and nausea.  ?Genitourinary:  Negative for dysuria and frequency.  ?Musculoskeletal:  Negative for falls.  ?Skin:  Negative for rash.  ?Neurological:  Negative for dizziness, loss of consciousness and headaches.  ?Endo/Heme/Allergies:  Negative for environmental allergies.  ?Psychiatric/Behavioral:  Negative for depression. The patient is nervous/anxious.   ? ?   ?Objective:  ?  ?Physical Exam ?Constitutional:   ?   General: She is not in acute distress. ?   Appearance: She is well-developed.  ?HENT:  ?   Head: Normocephalic and atraumatic.  ?Eyes:  ?   Conjunctiva/sclera: Conjunctivae normal.  ?Neck:  ?   Thyroid: No thyromegaly.  ?Cardiovascular:  ?   Rate and Rhythm: Normal rate and regular rhythm.  ?   Heart sounds: Normal heart sounds. No murmur heard. ?Pulmonary:  ?   Effort: Pulmonary effort is normal. No respiratory distress.  ?   Breath sounds: Normal breath sounds.  ?Abdominal:  ?   General: Bowel sounds are normal. There is no distension.  ?    Palpations: Abdomen is soft. There is no mass.  ?   Tenderness: There is no abdominal tenderness.  ?Musculoskeletal:  ?   Cervical back: Neck supple.  ?Lymphadenopathy:  ?   Cervical: No cervical adenopathy.  ?Skin: ?   General: Skin is warm and dry.  ?Neurological:  ?   Mental Status: She is alert and oriented  to person, place, and time.  ?Psychiatric:     ?   Behavior: Behavior normal.  ? ? ?BP 118/70 (BP Location: Left Arm, Patient Position: Sitting, Cuff Size: Normal)   Pulse 65   Resp 20   Ht '5\' 1"'$  (1.549 m)   Wt 105 lb (47.6 kg)   LMP 11/16/2008   SpO2 99%   BMI 19.84 kg/m?  ?Wt Readings from Last 3 Encounters:  ?12/04/21 105 lb (47.6 kg)  ?11/24/21 107 lb 3.2 oz (48.6 kg)  ?03/07/21 110 lb (49.9 kg)  ? ? ?Diabetic Foot Exam - Simple   ?No data filed ?  ? ?Lab Results  ?Component Value Date  ? WBC 6.9 11/27/2021  ? HGB 13.1 11/27/2021  ? HCT 37.7 11/27/2021  ? PLT 353.0 11/27/2021  ? GLUCOSE 96 12/04/2021  ? CHOL 192 12/05/2021  ? TRIG 106.0 12/05/2021  ? HDL 60.50 12/05/2021  ? LDLCALC 110 (H) 12/05/2021  ? ALT 20 12/04/2021  ? AST 18 12/04/2021  ? NA 138 12/04/2021  ? K 3.8 12/04/2021  ? CL 100 12/04/2021  ? CREATININE 1.02 12/04/2021  ? BUN 8 12/04/2021  ? CO2 28 12/04/2021  ? TSH 2.12 12/05/2021  ? ? ?Lab Results  ?Component Value Date  ? TSH 2.12 12/05/2021  ? ?Lab Results  ?Component Value Date  ? WBC 6.9 11/27/2021  ? HGB 13.1 11/27/2021  ? HCT 37.7 11/27/2021  ? MCV 92.3 11/27/2021  ? PLT 353.0 11/27/2021  ? ?Lab Results  ?Component Value Date  ? NA 138 12/04/2021  ? K 3.8 12/04/2021  ? CO2 28 12/04/2021  ? GLUCOSE 96 12/04/2021  ? BUN 8 12/04/2021  ? CREATININE 1.02 12/04/2021  ? BILITOT 0.3 12/04/2021  ? ALKPHOS 127 (H) 12/04/2021  ? AST 18 12/04/2021  ? ALT 20 12/04/2021  ? PROT 6.9 12/04/2021  ? ALBUMIN 4.7 12/04/2021  ? CALCIUM 9.5 12/04/2021  ? GFR 60.08 12/04/2021  ? ?Lab Results  ?Component Value Date  ? CHOL 192 12/05/2021  ? ?Lab Results  ?Component Value Date  ? HDL 60.50 12/05/2021   ? ?Lab Results  ?Component Value Date  ? LDLCALC 110 (H) 12/05/2021  ? ?Lab Results  ?Component Value Date  ? TRIG 106.0 12/05/2021  ? ?Lab Results  ?Component Value Date  ? CHOLHDL 3 12/05/2021  ? ?No results found for:

## 2021-12-04 ENCOUNTER — Encounter: Payer: Self-pay | Admitting: Family Medicine

## 2021-12-04 ENCOUNTER — Ambulatory Visit (INDEPENDENT_AMBULATORY_CARE_PROVIDER_SITE_OTHER): Payer: 59 | Admitting: Family Medicine

## 2021-12-04 VITALS — BP 118/70 | HR 65 | Resp 20 | Ht 61.0 in | Wt 105.0 lb

## 2021-12-04 DIAGNOSIS — R197 Diarrhea, unspecified: Secondary | ICD-10-CM

## 2021-12-04 DIAGNOSIS — R7989 Other specified abnormal findings of blood chemistry: Secondary | ICD-10-CM

## 2021-12-04 DIAGNOSIS — D729 Disorder of white blood cells, unspecified: Secondary | ICD-10-CM

## 2021-12-04 DIAGNOSIS — E559 Vitamin D deficiency, unspecified: Secondary | ICD-10-CM | POA: Diagnosis not present

## 2021-12-04 DIAGNOSIS — F418 Other specified anxiety disorders: Secondary | ICD-10-CM | POA: Diagnosis not present

## 2021-12-04 DIAGNOSIS — E785 Hyperlipidemia, unspecified: Secondary | ICD-10-CM | POA: Diagnosis not present

## 2021-12-04 DIAGNOSIS — R946 Abnormal results of thyroid function studies: Secondary | ICD-10-CM

## 2021-12-04 MED ORDER — TRAZODONE HCL 50 MG PO TABS: 25.0000 mg | ORAL_TABLET | Freq: Every evening | ORAL | 3 refills | Status: DC | PRN

## 2021-12-04 MED ORDER — BUSPIRONE HCL 10 MG PO TABS: 10.0000 mg | ORAL_TABLET | Freq: Three times a day (TID) | ORAL | 1 refills | Status: DC | PRN

## 2021-12-04 NOTE — Patient Instructions (Signed)
Diarrhea, Adult Diarrhea is frequent loose and watery bowel movements. Diarrhea can make you feel weak and cause you to become dehydrated. Dehydration can make you tired and thirsty, cause you to have a dry mouth, and decrease how often you urinate. Diarrhea typically lasts 2-3 days. However, it can last longer if it is a sign of something more serious. It is important to treat your diarrhea as told by your health care provider. Follow these instructions at home: Eating and drinking     Follow these recommendations as told by your health care provider: Take an oral rehydration solution (ORS). This is an over-the-counter medicine that helps return your body to its normal balance of nutrients and water. It is found at pharmacies and retail stores. Drink plenty of fluids, such as water, ice chips, diluted fruit juice, and low-calorie sports drinks. You can drink milk also, if desired. Avoid drinking fluids that contain a lot of sugar or caffeine, such as energy drinks, sports drinks, and soda. Eat bland, easy-to-digest foods in small amounts as you are able. These foods include bananas, applesauce, rice, lean meats, toast, and crackers. Avoid alcohol. Avoid spicy or fatty foods.  Medicines Take over-the-counter and prescription medicines only as told by your health care provider. If you were prescribed an antibiotic medicine, take it as told by your health care provider. Do not stop using the antibiotic even if you start to feel better. General instructions  Wash your hands often using soap and water. If soap and water are not available, use a hand sanitizer. Others in the household should wash their hands as well. Hands should be washed: After using the toilet or changing a diaper. Before preparing, cooking, or serving food. While caring for a sick person or while visiting someone in a hospital. Drink enough fluid to keep your urine pale yellow. Rest at home while you recover. Watch your  condition for any changes. Take a warm bath to relieve any burning or pain from frequent diarrhea episodes. Keep all follow-up visits as told by your health care provider. This is important. Contact a health care provider if: You have a fever. Your diarrhea gets worse. You have new symptoms. You cannot keep fluids down. You feel light-headed or dizzy. You have a headache. You have muscle cramps. Get help right away if: You have chest pain. You feel extremely weak or you faint. You have bloody or black stools or stools that look like tar. You have severe pain, cramping, or bloating in your abdomen. You have trouble breathing or you are breathing very quickly. Your heart is beating very quickly. Your skin feels cold and clammy. You feel confused. You have signs of dehydration, such as: Dark urine, very little urine, or no urine. Cracked lips. Dry mouth. Sunken eyes. Sleepiness. Weakness. Summary Diarrhea is frequent loose and sometimes watery bowel movements. Diarrhea can make you feel weak and cause you to become dehydrated. Drink enough fluids to keep your urine pale yellow. Make sure that you wash your hands after using the toilet. If soap and water are not available, use hand sanitizer. Contact a health care provider if your diarrhea gets worse or you have new symptoms. Get help right away if you have signs of dehydration. This information is not intended to replace advice given to you by your health care provider. Make sure you discuss any questions you have with your health care provider. Document Revised: 01/29/2021 Document Reviewed: 01/29/2021 Elsevier Patient Education  2023 [employer]  

## 2021-12-05 ENCOUNTER — Other Ambulatory Visit (INDEPENDENT_AMBULATORY_CARE_PROVIDER_SITE_OTHER): Payer: 59

## 2021-12-05 DIAGNOSIS — E559 Vitamin D deficiency, unspecified: Secondary | ICD-10-CM | POA: Diagnosis not present

## 2021-12-05 LAB — LIPID PANEL
Cholesterol: 189 mg/dL (ref 0–200)
Cholesterol: 192 mg/dL (ref 0–200)
HDL: 59 mg/dL (ref >39.00)
HDL: 60.5 mg/dL (ref >39.00)
LDL Cholesterol: 109 mg/dL — ABNORMAL HIGH (ref 0–99)
LDL Cholesterol: 110 mg/dL — ABNORMAL HIGH (ref 0–99)
NonHDL: 130.04
NonHDL: 131.53
Total CHOL/HDL Ratio: 3
Total CHOL/HDL Ratio: 3
Triglycerides: 103 mg/dL (ref 0.0–149.0)
Triglycerides: 106 mg/dL (ref 0.0–149.0)
VLDL: 20.6 mg/dL (ref 0.0–40.0)
VLDL: 21.2 mg/dL (ref 0.0–40.0)

## 2021-12-05 LAB — COMPREHENSIVE METABOLIC PANEL
ALT: 20 U/L (ref 0–35)
AST: 18 U/L (ref 0–37)
Albumin: 4.7 g/dL (ref 3.5–5.2)
Alkaline Phosphatase: 127 U/L — ABNORMAL HIGH (ref 39–117)
BUN: 8 mg/dL (ref 6–23)
CO2: 28 mEq/L (ref 19–32)
Calcium: 9.5 mg/dL (ref 8.4–10.5)
Chloride: 100 mEq/L (ref 96–112)
Creatinine, Ser: 1.02 mg/dL (ref 0.40–1.20)
GFR: 60.08 mL/min (ref >60.00)
Glucose, Bld: 96 mg/dL (ref 70–99)
Potassium: 3.8 mEq/L (ref 3.5–5.1)
Sodium: 138 mEq/L (ref 135–145)
Total Bilirubin: 0.3 mg/dL (ref 0.2–1.2)
Total Protein: 6.9 g/dL (ref 6.0–8.3)

## 2021-12-05 LAB — VITAMIN D 25 HYDROXY (VIT D DEFICIENCY, FRACTURES)
VITD: 41.91 ng/mL (ref 30.00–100.00)
VITD: 36.33 ng/mL (ref 30.00–100.00)

## 2021-12-05 LAB — TSH
TSH: 2.17 u[IU]/mL (ref 0.35–5.50)
TSH: 2.12 u[IU]/mL (ref 0.35–5.50)

## 2021-12-05 LAB — GAMMA GT: GGT: 77 U/L — ABNORMAL HIGH (ref 7–51)

## 2021-12-08 DIAGNOSIS — R197 Diarrhea, unspecified: Secondary | ICD-10-CM | POA: Insufficient documentation

## 2021-12-08 DIAGNOSIS — R7989 Other specified abnormal findings of blood chemistry: Secondary | ICD-10-CM | POA: Insufficient documentation

## 2021-12-08 DIAGNOSIS — D729 Disorder of white blood cells, unspecified: Secondary | ICD-10-CM | POA: Insufficient documentation

## 2021-12-08 NOTE — Assessment & Plan Note (Addendum)
she has been under a great deal of stress and a friend of hers was recently diagnosed with a GI cancer so her recent trouble with elevated liver markers makes her very anxious will decrease Buspirone and use infrequently prn and will add Trazodone 25 mg and can increase to 50 mg qhs. Reassess in 8-12 weeks or as needed. Spent 30 minutes in discussion, formulating plan and counseling,  ?

## 2021-12-08 NOTE — Assessment & Plan Note (Addendum)
Stool cultures ordered and patient advised to add fiber supplement bid and avoid processed, fatty and rich foods. Add a probiotic ?

## 2021-12-08 NOTE — Assessment & Plan Note (Signed)
Continues to improve. Likely secondary to a recent viral illness. Continue to minimize processed foods and simple carbohydrates. Will monitor ?

## 2021-12-08 NOTE — Assessment & Plan Note (Signed)
Supplement and monitor 

## 2021-12-08 NOTE — Assessment & Plan Note (Signed)
normalized 

## 2022-02-12 ENCOUNTER — Other Ambulatory Visit: Payer: Self-pay | Admitting: Family Medicine

## 2022-03-02 ENCOUNTER — Other Ambulatory Visit: Payer: Self-pay | Admitting: Family Medicine

## 2022-03-02 DIAGNOSIS — M62838 Other muscle spasm: Secondary | ICD-10-CM

## 2022-04-07 ENCOUNTER — Other Ambulatory Visit (HOSPITAL_COMMUNITY)
Admission: RE | Admit: 2022-04-07 | Discharge: 2022-04-07 | Disposition: A | Discharge status: Home / Self Care | Payer: Commercial Managed Care - HMO | Source: Ambulatory Visit | Attending: Family | Admitting: Family

## 2022-04-07 ENCOUNTER — Other Ambulatory Visit (HOSPITAL_BASED_OUTPATIENT_CLINIC_OR_DEPARTMENT_OTHER): Payer: Self-pay

## 2022-04-07 ENCOUNTER — Ambulatory Visit: Payer: Commercial Managed Care - HMO | Admitting: Family

## 2022-04-07 VITALS — BP 110/60 | HR 60 | Temp 97.9°F | Resp 18 | Ht 61.0 in | Wt 104.0 lb

## 2022-04-07 DIAGNOSIS — R42 Dizziness and giddiness: Secondary | ICD-10-CM

## 2022-04-07 DIAGNOSIS — N898 Other specified noninflammatory disorders of vagina: Secondary | ICD-10-CM | POA: Diagnosis not present

## 2022-04-07 DIAGNOSIS — F418 Other specified anxiety disorders: Secondary | ICD-10-CM

## 2022-04-07 LAB — VITAMIN B12: Vitamin B-12: 381 pg/mL (ref 211–911)

## 2022-04-07 LAB — COMPREHENSIVE METABOLIC PANEL
ALT: 14 U/L (ref 0–35)
AST: 19 U/L (ref 0–37)
Albumin: 4.2 g/dL (ref 3.5–5.2)
Alkaline Phosphatase: 68 U/L (ref 39–117)
BUN: 9 mg/dL (ref 6–23)
CO2: 30 mEq/L (ref 19–32)
Calcium: 9.7 mg/dL (ref 8.4–10.5)
Chloride: 105 mEq/L (ref 96–112)
Creatinine, Ser: 0.86 mg/dL (ref 0.40–1.20)
GFR: 73.56 mL/min (ref >60.00)
Glucose, Bld: 74 mg/dL (ref 70–99)
Potassium: 3.9 mEq/L (ref 3.5–5.1)
Sodium: 141 mEq/L (ref 135–145)
Total Bilirubin: 0.5 mg/dL (ref 0.2–1.2)
Total Protein: 6.4 g/dL (ref 6.0–8.3)

## 2022-04-07 LAB — CBC WITH DIFFERENTIAL/PLATELET
Basophils Absolute: 0 10*3/uL (ref 0.0–0.1)
Basophils Relative: 0.5 % (ref 0.0–3.0)
Eosinophils Absolute: 0.2 10*3/uL (ref 0.0–0.7)
Eosinophils Relative: 2.2 % (ref 0.0–5.0)
HCT: 39.5 % (ref 36.0–46.0)
Hemoglobin: 13.4 g/dL (ref 12.0–15.0)
Lymphocytes Relative: 24.7 % (ref 12.0–46.0)
Lymphs Abs: 1.7 10*3/uL (ref 0.7–4.0)
MCHC: 33.8 g/dL (ref 30.0–36.0)
MCV: 93.9 fl (ref 78.0–100.0)
Monocytes Absolute: 0.4 10*3/uL (ref 0.1–1.0)
Monocytes Relative: 6.4 % (ref 3.0–12.0)
Neutro Abs: 4.5 10*3/uL (ref 1.4–7.7)
Neutrophils Relative %: 66.2 % (ref 43.0–77.0)
Platelets: 296 10*3/uL (ref 150.0–400.0)
RBC: 4.21 Mil/uL (ref 3.87–5.11)
RDW: 12.5 % (ref 11.5–15.5)
WBC: 6.7 10*3/uL (ref 4.0–10.5)

## 2022-04-07 MED ORDER — MECLIZINE HCL 25 MG PO TABS
25.0000 mg | ORAL_TABLET | Freq: Three times a day (TID) | ORAL | 0 refills | Status: AC | PRN
  Filled 2022-04-07: qty 30, 10d supply, fill #0

## 2022-04-07 MED ORDER — ALPRAZOLAM 0.5 MG PO TABS
0.5000 mg | ORAL_TABLET | Freq: Every day | ORAL | 0 refills | Status: DC | PRN
  Filled 2022-04-07: qty 20, 20d supply, fill #0

## 2022-04-07 NOTE — Progress Notes (Signed)
Autumn Myers is a 61 y.o. female with the following history as recorded in [employer]:  Patient Active Problem List   Diagnosis Date Noted   Abnormal WBC count 12/08/2021   Elevated liver function tests 12/08/2021   Diarrhea 12/08/2021   Cellulitis of antihelix of left ear 11/24/2021   Allergic reaction to spider bite 11/24/2021   Screening breast examination 08/31/2019   LLQ pain 08/09/2017   Low back pain 08/09/2017   Raynaud phenomenon 04/15/2014   Right flank pain 03/28/2014   Chest pain 03/28/2014   Personal history of colonic adenoma 06/23/2013   Neck pain 05/13/2013   Other and unspecified hyperlipidemia 05/13/2013   Abnormal results of thyroid function studies 01/28/2013   Hematuria 01/29/2012   Renal lithiasis 01/29/2012   Hip pain, right 01/29/2012   Allergic state 11/20/2011   Headache 11/20/2011   Myalgia 11/20/2011   Preventative health care 10/13/2011   Depression with anxiety    Vitamin D deficiency    Smoker     Current Outpatient Medications  Medication Sig Dispense Refill   ALPRAZolam (XANAX) 0.5 MG tablet Take 1 tablet (0.5 mg total) by mouth daily as needed for anxiety. 20 tablet 0   Biotin 10000 MCG TABS Take 1 tablet by mouth daily.     citalopram (CELEXA) 40 MG tablet Take 1 tablet by mouth once daily 90 tablet 0   cyclobenzaprine (FLEXERIL) 10 MG tablet TAKE 1 TABLET BY MOUTH ONCE DAILY AT BEDTIME AS NEEDED FOR MUSCLE SPASM 30 tablet 0   loratadine (CLARITIN) 10 MG tablet Take 10 mg by mouth daily.     meclizine (ANTIVERT) 25 MG tablet Take 1 tablet (25 mg total) by mouth 3 (three) times daily as needed for dizziness. 30 tablet 0   Multiple Vitamin (MULTIVITAMIN) tablet Take 1 tablet by mouth daily.     tiZANidine (ZANAFLEX) 2 MG tablet Take 0.5-2 tablets (1-4 mg total) by mouth 2 (two) times daily as needed for muscle spasms. 40 tablet 0   traZODone (DESYREL) 50 MG tablet Take 0.5-1 tablets (25-50 mg total) by mouth at bedtime as needed for sleep. 30  tablet 3   No current facility-administered medications for this visit.    Allergies: Eggs or egg-derived products, Neosporin [neomycin-bacitracin zn-polymyx], Penicillins, and Codeine  Past Medical History:  Diagnosis Date   Abnormal results of thyroid function studies 01/28/2013   Allergic state 11/20/2011   Allergy    seasonal   Anxiety    Cancer ([place])    basal cell   Chicken pox as a child   Depression    Depression with anxiety    Headache(784.0) 11/20/2011   Hematuria 01/29/2012   Hip pain, right 01/29/2012   Myalgia 11/20/2011   Neck pain 05/13/2013   Other and unspecified hyperlipidemia 05/13/2013   Personal history of colonic adenoma 06/23/2013   06/23/2013 2 dminutive polyps     Preventative health care 10/13/2011   Puncture wound of thigh, right 02/03/2012   Raynaud phenomenon 04/15/2014   Renal lithiasis 01/29/2012   Sinusitis acute 01/29/2012   Sinusitis, acute 01/29/2012   Smoker    Vitamin D deficiency    Wound infection 06/20/2012    Past Surgical History:  Procedure Laterality Date   CARPAL TUNNEL RELEASE     on right and left   [place]   COLONOSCOPY  2014   COLONOSCOPY W/ POLYPECTOMY  2014   1 was precancerous   LITHOTRIPSY     POLYPECTOMY  Family History  Problem Relation Age of Onset   Hypertension Mother    Parkinsonism Mother    Stroke Father    Hypertension Father    Cancer Father        leukemia   Colon polyps Father    Depression Sister    Anxiety disorder Sister    Colon polyps Sister    Heart disease Maternal Grandfather    Colon polyps Brother    Parkinsonism Maternal Grandmother    Colon cancer Neg Hx    Stomach cancer Neg Hx    Esophageal cancer Neg Hx    Rectal cancer Neg Hx     Social History   Tobacco Use   Smoking status: Every Day    Years: 15.00    Types: Cigarettes   Smokeless tobacco: Never   Tobacco comments:    less than a pack a day  Substance Use Topics   Alcohol use: No    Subjective:   Patient has had intermittent episodes of dizziness/ feeling light headed in the past 1-2 months; had "really bad episode" approximately 1 month- felt the room spinning around her- " I just woke up that way."  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- admits that stress level has been high; feels that her anxiety is causing a lot of her symptoms; is currently taking Celexa 40 mg daily but also wants something that can be taken as needed when the anxiety is bad;   Also mentions that has been having vaginal discharge recently- is not sexually active; went through menopause at age 72;    Objective:  Vitals:   04/07/22 0847  BP: 110/60  Pulse: 60  Resp: 18  Temp: 97.9 F (36.6 C)  TempSrc: Temporal  SpO2: 99%  Weight: 104 lb (47.2 kg)  Height: 5' 1" (1.549 m)    General: Well developed, well nourished, in no acute distress  Skin : Warm and dry.  Head: Normocephalic and atraumatic  Eyes: Sclera and conjunctiva clear; pupils round and reactive to light; extraocular movements intact  Ears: External normal; canals clear; tympanic membranes normal  Oropharynx: Pink, supple. No suspicious lesions  Neck: Supple without thyromegaly, adenopathy  Lungs: Respirations unlabored; clear to auscultation bilaterally without wheeze, rales, rhonchi  CVS exam: normal rate and regular rhythm.  Neurologic: Alert and oriented; speech intact; face symmetrical; moves all extremities well; CNII-XII intact without focal deficit   Assessment:  1. Dizziness   2. Depression with anxiety   3. Vaginal discharge     Plan:  Suspect anxiety and vertigo contributing to symptoms; EKG shows sinus rhythm in office; will update labs and head CT is ordered; Encouraged to try and take time for herself as she is able; eat regular meals; will give short term Rx for Xanax to use prn and she will plan to do a virtual visit with her PCP for further discussion; Update vaginal swab today- if no infection is found, plan to  refer to GYN;   No follow-ups on file.  Orders Placed This Encounter  Procedures   CT HEAD WO CONTRAST (5MM)    Standing Status:   Future    Standing Expiration Date:   04/08/2023    Order Specific Question:   Is patient pregnant?    Answer:   No    Order Specific Question:   Preferred imaging location?    Answer:   [facility]   CBC with Differential/Platelet   Comp Met (CMET)  B12   EKG 12-Lead    Requested Prescriptions   Signed Prescriptions Disp Refills   meclizine (ANTIVERT) 25 MG tablet 30 tablet 0    Sig: Take 1 tablet (25 mg total) by mouth 3 (three) times daily as needed for dizziness.   ALPRAZolam (XANAX) 0.5 MG tablet 20 tablet 0    Sig: Take 1 tablet (0.5 mg total) by mouth daily as needed for anxiety.

## 2022-04-08 ENCOUNTER — Other Ambulatory Visit: Payer: Self-pay | Admitting: Family

## 2022-04-08 ENCOUNTER — Telehealth: Payer: Commercial Managed Care - HMO | Admitting: Family Medicine

## 2022-04-08 DIAGNOSIS — F418 Other specified anxiety disorders: Secondary | ICD-10-CM

## 2022-04-08 LAB — CERVICOVAGINAL ANCILLARY ONLY
Bacterial Vaginitis (gardnerella): POSITIVE — AB
Candida Glabrata: NEGATIVE
Candida Vaginitis: NEGATIVE
Chlamydia: NEGATIVE
Comment: NEGATIVE
Comment: NEGATIVE
Comment: NEGATIVE
Comment: NEGATIVE
Comment: NEGATIVE
Comment: NORMAL
Neisseria Gonorrhea: NEGATIVE
Trichomonas: NEGATIVE

## 2022-04-08 MED ORDER — METRONIDAZOLE 500 MG PO TABS: 500.0000 mg | ORAL_TABLET | Freq: Two times a day (BID) | ORAL | 0 refills | Status: DC

## 2022-04-08 MED ORDER — METRONIDAZOLE 500 MG PO TABS: 500.0000 mg | ORAL_TABLET | Freq: Two times a day (BID) | ORAL | 0 refills | Status: AC

## 2022-04-08 MED ORDER — TRAZODONE HCL 50 MG PO TABS: 25.0000 mg | ORAL_TABLET | Freq: Two times a day (BID) | ORAL | 3 refills | Status: DC

## 2022-04-08 NOTE — Addendum Note (Signed)
Addended by: Kittie Plater, Paden Kuras HUA on: 04/08/2022 04:05 PM   Modules accepted: Orders

## 2022-04-08 NOTE — Progress Notes (Signed)
MyChart Video Visit    Virtual Visit via Video Note   This visit type was conducted due to national recommendations for restrictions regarding the COVID-19 Pandemic (e.g. social distancing) in an effort to limit this patient's exposure and mitigate transmission in our community. This patient is at least at moderate risk for complications without adequate follow up. This format is felt to be most appropriate for this patient at this time. Physical exam was limited by quality of the video and audio technology used for the visit. Shamaine, CMA was able to get the patient set up on a video visit.  Patient location: home Patient and provider in visit Provider location: Office  I discussed the limitations of evaluation and management by telemedicine and the availability of in person appointments. The patient expressed understanding and agreed to proceed.  Visit Date: 04/08/2022  Today's healthcare provider: Penni Homans, MD     Subjective:    Patient ID: Autumn Myers, female    DOB: 08/04/1960, 61 y.o.   MRN: [id number]  Chief Complaint  Patient presents with   Anxiety    Discuss Anxiety     HPI Patient is in today for evaluation of worsening anxiety. 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 They were both hospitalized in June and she has been caring for them since. She has them in Independent living now but they both need extra care so she goes there daily and she had to give up her job. She acknowledges that when she gets overly stressed she will experience sob, palpitations as she has in the past when she was stressed. Denies CP/HA/congestion/fevers/GI or GU c/o. Taking meds as prescribed   Past Medical History:  Diagnosis Date   Abnormal results of thyroid function studies 01/28/2013   Allergic state 11/20/2011   Allergy    seasonal   Anxiety    Cancer ([place])    basal cell   Chicken pox as a child    Depression    Depression with anxiety    Headache(784.0) 11/20/2011   Hematuria 01/29/2012   Hip pain, right 01/29/2012   Myalgia 11/20/2011   Neck pain 05/13/2013   Other and unspecified hyperlipidemia 05/13/2013   Personal history of colonic adenoma 06/23/2013   06/23/2013 2 dminutive polyps     Preventative health care 10/13/2011   Puncture wound of thigh, right 02/03/2012   Raynaud phenomenon 04/15/2014   Renal lithiasis 01/29/2012   Sinusitis acute 01/29/2012   Sinusitis, acute 01/29/2012   Smoker    Vitamin D deficiency    Wound infection 06/20/2012    Past Surgical History:  Procedure Laterality Date   CARPAL TUNNEL RELEASE     on right and left   CESAREAN SECTION  1994   COLONOSCOPY  2014   COLONOSCOPY W/ POLYPECTOMY  2014   1 was precancerous   LITHOTRIPSY     POLYPECTOMY      Family History  Problem Relation Age of Onset   Hypertension Mother    Parkinsonism Mother    Stroke Father    Hypertension Father    Cancer Father        leukemia   Colon polyps Father    Depression Sister    Anxiety disorder Sister    Colon polyps Sister    Heart disease Maternal Grandfather    Colon polyps Brother    Parkinsonism Maternal Grandmother  Colon cancer Neg Hx    Stomach cancer Neg Hx    Esophageal cancer Neg Hx    Rectal cancer Neg Hx     Social History   Socioeconomic History   Marital status: Divorced    Spouse name: Not on file   Number of children: 2   Years of education: Not on file   Highest education level: High school graduate  Occupational History   Not on file  Tobacco Use   Smoking status: Every Day    Years: 15.00    Types: Cigarettes   Smokeless tobacco: Never   Tobacco comments:    less than a pack a day  Vaping Use   Vaping Use: Never used  Substance and Sexual Activity   Alcohol use: No   Drug use: No   Sexual activity: Not Currently    Partners: Male    Birth control/protection: None  Other Topics Concern   Not on file  Social  History Narrative   Not on file   Social Determinants of Health   Financial Resource Strain: Not on file  Food Insecurity: Not on file  Transportation Needs: No Transportation Needs (01/02/2021)   PRAPARE - Hydrologist (Medical): No    Lack of Transportation (Non-Medical): No  Physical Activity: Not on file  Stress: Not on file  Social Connections: Not on file  Intimate Partner Violence: Not on file    Outpatient Medications Prior to Visit  Medication Sig Dispense Refill   ALPRAZolam (XANAX) 0.5 MG tablet Take 1 tablet (0.5 mg total) by mouth daily as needed for anxiety. 20 tablet 0   Biotin 10000 MCG TABS Take 1 tablet by mouth daily.     citalopram (CELEXA) 40 MG tablet Take 1 tablet by mouth once daily 90 tablet 0   cyclobenzaprine (FLEXERIL) 10 MG tablet TAKE 1 TABLET BY MOUTH ONCE DAILY AT BEDTIME AS NEEDED FOR MUSCLE SPASM 30 tablet 0   loratadine (CLARITIN) 10 MG tablet Take 10 mg by mouth daily.     meclizine (ANTIVERT) 25 MG tablet Take 1 tablet (25 mg total) by mouth 3 (three) times daily as needed for dizziness. 30 tablet 0   Multiple Vitamin (MULTIVITAMIN) tablet Take 1 tablet by mouth daily.     tiZANidine (ZANAFLEX) 2 MG tablet Take 0.5-2 tablets (1-4 mg total) by mouth 2 (two) times daily as needed for muscle spasms. 40 tablet 0   traZODone (DESYREL) 50 MG tablet Take 0.5-1 tablets (25-50 mg total) by mouth at bedtime as needed for sleep. 30 tablet 3   No facility-administered medications prior to visit.    Allergies  Allergen Reactions   Eggs Or Egg-Derived Products Nausea And Vomiting    cramping   Neosporin [Neomycin-Bacitracin Zn-Polymyx] Itching    Inflamed, itchy    Penicillins Hives   Codeine     GI upset    Review of Systems  Constitutional:  Positive for malaise/fatigue. Negative for fever.  HENT:  Negative for congestion.   Eyes:  Negative for blurred vision.  Respiratory:  Positive for shortness of breath.    Cardiovascular:  Positive for palpitations. Negative for chest pain and leg swelling.  Gastrointestinal:  Negative for abdominal pain, blood in stool and nausea.  Genitourinary:  Negative for dysuria and frequency.  Musculoskeletal:  Negative for falls.  Skin:  Negative for rash.  Neurological:  Positive for headaches. Negative for dizziness and loss of consciousness.  Endo/Heme/Allergies:  Negative for environmental  allergies.  Psychiatric/Behavioral:  Positive for depression. The patient is nervous/anxious.        Objective:    Physical Exam Constitutional:      General: She is not in acute distress.    Appearance: She is well-developed.  HENT:     Head: Normocephalic and atraumatic.  Eyes:     Conjunctiva/sclera: Conjunctivae normal.  Neck:     Thyroid: No thyromegaly.  Cardiovascular:     Rate and Rhythm: Normal rate and regular rhythm.     Heart sounds: Normal heart sounds. No murmur heard. Pulmonary:     Effort: Pulmonary effort is normal. No respiratory distress.     Breath sounds: Normal breath sounds.  Abdominal:     General: Bowel sounds are normal. There is no distension.     Palpations: Abdomen is soft. There is no mass.     Tenderness: There is no abdominal tenderness.  Musculoskeletal:     Cervical back: Neck supple.  Lymphadenopathy:     Cervical: No cervical adenopathy.  Skin:    General: Skin is warm and dry.  Neurological:     Mental Status: She is alert and oriented to person, place, and time.  Psychiatric:        Behavior: Behavior normal.     LMP 11/06/2008  Wt Readings from Last 3 Encounters:  04/07/22 104 lb (47.2 kg)  12/04/21 105 lb (47.6 kg)  11/24/21 107 lb 3.2 oz (48.6 kg)    Diabetic Foot Exam - Simple   No data filed    Lab Results  Component Value Date   WBC 6.7 04/07/2022   HGB 13.4 04/07/2022   HCT 39.5 04/07/2022   PLT 296.0 04/07/2022   GLUCOSE 74 04/07/2022   CHOL 192 12/05/2021   TRIG 106.0 12/05/2021   HDL 60.50  12/05/2021   LDLCALC 110 (H) 12/05/2021   ALT 14 04/07/2022   AST 19 04/07/2022   NA 141 04/07/2022   K 3.9 04/07/2022   CL 105 04/07/2022   CREATININE 0.86 04/07/2022   BUN 9 04/07/2022   CO2 30 04/07/2022   TSH 2.12 12/05/2021    Lab Results  Component Value Date   TSH 2.12 12/05/2021   Lab Results  Component Value Date   WBC 6.7 04/07/2022   HGB 13.4 04/07/2022   HCT 39.5 04/07/2022   MCV 93.9 04/07/2022   PLT 296.0 04/07/2022   Lab Results  Component Value Date   NA 141 04/07/2022   K 3.9 04/07/2022   CO2 30 04/07/2022   GLUCOSE 74 04/07/2022   BUN 9 04/07/2022   CREATININE 0.86 04/07/2022   BILITOT 0.5 04/07/2022   ALKPHOS 68 04/07/2022   AST 19 04/07/2022   ALT 14 04/07/2022   PROT 6.4 04/07/2022   ALBUMIN 4.2 04/07/2022   CALCIUM 9.7 04/07/2022   GFR 73.56 04/07/2022   Lab Results  Component Value Date   CHOL 192 12/05/2021   Lab Results  Component Value Date   HDL 60.50 12/05/2021   Lab Results  Component Value Date   LDLCALC 110 (H) 12/05/2021   Lab Results  Component Value Date   TRIG 106.0 12/05/2021   Lab Results  Component Value Date   CHOLHDL 3 12/05/2021   No results found for: "HGBA1C"     Assessment & Plan:   Problem List Items Addressed This Visit     Depression with anxiety    She is under a great deal of stress as she is the  primary care giver for both of her ailing parents in their 61s. They were both hospitalized in June and she has been caring for them since. She has them in Independent living now but they both need extra care so she goes there daily and she had to give up her job. Will continue the Citalopram at 40 mg and we will increase Trazodone to 25 ng bid and then to 50 bid as needed and as tolerated. She can use Alprazolam sparingly as well. Spent 30 minutes counseling patient and developing a plan of care.       Relevant Medications   traZODone (DESYREL) 50 MG tablet    I have changed Traniece P. Luzader's  traZODone. I am also having her maintain her loratadine, Biotin, multivitamin, citalopram, tiZANidine, cyclobenzaprine, meclizine, and ALPRAZolam.  Meds ordered this encounter  Medications   traZODone (DESYREL) 50 MG tablet    Sig: Take 0.5-1 tablets (25-50 mg total) by mouth 2 (two) times daily.    Dispense:  120 tablet    Refill:  3    I discussed the assessment and treatment plan with the patient. The patient was provided an opportunity to ask questions and all were answered. The patient agreed with the plan and demonstrated an understanding of the instructions.   The patient was advised to call back or seek an in-person evaluation if the symptoms worsen or if the condition fails to improve as anticipated.  I provided 35 minutes of face-to-face time during this encounter.   Penni Homans, MD [facility] at [facility] [phone] (phone) [phone] (fax)  [place]

## 2022-04-08 NOTE — Assessment & Plan Note (Addendum)
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 They were both hospitalized in June and she has been caring for them since. She has them in Independent living now but they both need extra care so she goes there daily and she had to give up her job. Will continue the Citalopram at 40 mg and we will increase Trazodone to 25 ng bid and then to 50 bid as needed and as tolerated. She can use Alprazolam sparingly as well. Spent 30 minutes counseling patient and developing a plan of care.

## 2022-04-09 ENCOUNTER — Telehealth (HOSPITAL_BASED_OUTPATIENT_CLINIC_OR_DEPARTMENT_OTHER): Payer: Self-pay

## 2022-04-09 ENCOUNTER — Ambulatory Visit (HOSPITAL_BASED_OUTPATIENT_CLINIC_OR_DEPARTMENT_OTHER)
Admission: RE | Admit: 2022-04-09 | Discharge: 2022-04-09 | Disposition: A | Discharge status: Home / Self Care | Payer: Commercial Managed Care - HMO | Source: Ambulatory Visit | Attending: Family | Admitting: Family

## 2022-04-09 DIAGNOSIS — R42 Dizziness and giddiness: Secondary | ICD-10-CM | POA: Diagnosis not present

## 2022-04-29 ENCOUNTER — Telehealth: Payer: Self-pay | Admitting: Family Medicine

## 2022-04-29 MED ORDER — METRONIDAZOLE 1.3 % VA GEL: 1.0000 | Freq: Every day | VAGINAL | 0 refills | Status: DC

## 2022-04-29 MED ORDER — METRONIDAZOLE 0.75 % VA GEL: 1.0000 | Freq: Every day | VAGINAL | 0 refills | Status: AC

## 2022-04-29 NOTE — Telephone Encounter (Signed)
Send a RX: Metronidazole gel 0.75% - once daily vaginally for five days. Office visit if no better

## 2022-04-29 NOTE — Addendum Note (Signed)
Addended byDamita Dunnings D on: 04/29/2022 01:47 PM   Modules accepted: Orders

## 2022-04-29 NOTE — Telephone Encounter (Signed)
Spoke with pt. Pt was diagnosed with BV on 04/07/22 and completed medication for BV on 9/11 and symptoms were resolved by the last day of treatment.  Currently having vaginal discharge that has been present x 1 week now.  She denies any other symptoms and reports vaginal discharge is the only symptom she was having with first infection. Wondering if she needs another round of treatment?  Please advise in PCP's absence.

## 2022-04-29 NOTE — Telephone Encounter (Signed)
Patient states she is not sure if her BV is back and would like to know if she could start another round of antibiotics. Please advise.

## 2022-04-29 NOTE — Telephone Encounter (Addendum)
Rx sent, Pt made aware.

## 2022-05-15 ENCOUNTER — Encounter: Payer: Self-pay | Admitting: Radiology

## 2022-05-15 ENCOUNTER — Ambulatory Visit (INDEPENDENT_AMBULATORY_CARE_PROVIDER_SITE_OTHER): Payer: Commercial Managed Care - HMO | Admitting: Radiology

## 2022-05-15 VITALS — BP 98/60 | Ht 61.0 in | Wt 102.0 lb

## 2022-05-15 DIAGNOSIS — N898 Other specified noninflammatory disorders of vagina: Secondary | ICD-10-CM | POA: Diagnosis not present

## 2022-05-15 DIAGNOSIS — N952 Postmenopausal atrophic vaginitis: Secondary | ICD-10-CM

## 2022-05-15 LAB — WET PREP FOR TRICH, YEAST, CLUE

## 2022-05-15 MED ORDER — ESTRADIOL 0.1 MG/GM VA CREA: 1.0000 g | TOPICAL_CREAM | VAGINAL | 12 refills | Status: DC

## 2022-05-15 NOTE — Progress Notes (Signed)
      Subjective: Autumn Myers is a 61 y.o. female who complains of persistent vaginal infection.  Treated for BV with Flagyl 2 weeks ago at PCP.  Symptoms returned after treatment with flagyl and was then given Metrogel.  Now complains of vaginal discharge, no odor, no itching. She is not sexually active.     Review of Systems  All other systems reviewed and are negative.   Past Medical History:  Diagnosis Date   Abnormal results of thyroid function studies 01/28/2013   Allergic state 11/20/2011   Allergy    seasonal   Anxiety    Cancer ([place])    basal cell   Chicken pox as a child   Depression    Depression with anxiety    Headache(784.0) 11/20/2011   Hematuria 01/29/2012   Hip pain, right 01/29/2012   Myalgia 11/20/2011   Neck pain 05/13/2013   Other and unspecified hyperlipidemia 05/13/2013   Personal history of colonic adenoma 06/23/2013   06/23/2013 2 dminutive polyps     Preventative health care 10/13/2011   Puncture wound of thigh, right 02/03/2012   Raynaud phenomenon 04/15/2014   Renal lithiasis 01/29/2012   Sinusitis acute 01/29/2012   Sinusitis, acute 01/29/2012   Smoker    Vitamin D deficiency    Wound infection 06/20/2012      Objective:  Today's Vitals   05/15/22 1051  BP: 98/60  Weight: 102 lb (46.3 kg)  Height: '5\' 1"'$  (1.549 m)   Body mass index is 19.27 kg/m.   -General: no acute distress -Vulva: without lesions or discharge -Vagina: discharge present, aptima swab and wet prep obtained -Cervix: no lesion or discharge, no CMT -Perineum: no lesions -Uterus: Mobile, non tender -Adnexa: no masses or tenderness   Microscopic wet-mount exam shows negative for pathogens, normal epithelial cells.   Chaperone offered and declined.  Assessment:/Plan:  1. Vaginal discharge - WET PREP FOR TRICH, YEAST, CLUE;negative  2. Atrophic vaginitis - estradiol (ESTRACE VAGINAL) 0.1 MG/GM vaginal cream; Place 1 g vaginally 3 (three) times a week.  Dispense: 42.5  g; Refill: 12   Will contact patient with results of testing completed today.  Avoid the use of soaps or perfumed products in the peri area. Avoid tub baths and sitting in sweaty or wet clothing for prolonged periods of time.

## 2022-05-21 ENCOUNTER — Other Ambulatory Visit: Payer: Self-pay | Admitting: Family Medicine

## 2022-05-24 NOTE — Assessment & Plan Note (Signed)
Encouraged increased hydration, 64 ounces of clear fluids daily. Minimize alcohol and caffeine. Eat small frequent meals with lean proteins and complex carbs. Avoid high and low blood sugars. Get adequate sleep, 7-8 hours a night. Needs exercise daily preferably in the morning.  

## 2022-05-24 NOTE — Assessment & Plan Note (Signed)
Struggles with anhedonia and anxiety

## 2022-05-24 NOTE — Assessment & Plan Note (Signed)
Encouraged moist heat and gentle stretching as tolerated. May try NSAIDs and prescription meds as directed and report if symptoms worsen or seek immediate care 

## 2022-05-24 NOTE — Assessment & Plan Note (Signed)
Hydrate and monitor 

## 2022-05-24 NOTE — Progress Notes (Signed)
MyChart Video Visit    Virtual Visit via Video Note   This visit type was conducted due to national recommendations for restrictions regarding the COVID-19 Pandemic (e.g. social distancing) in an effort to limit this patient's exposure and mitigate transmission in our community. This patient is at least at moderate risk for complications without adequate follow up. This format is felt to be most appropriate for this patient at this time. Physical exam was limited by quality of the video and audio technology used for the visit. Shamaine, CMA was able to get the patient set up on a video visit.  Patient location: home Patient and provider in visit Provider location: Office  I discussed the limitations of evaluation and management by telemedicine and the availability of in person appointments. The patient expressed understanding and agreed to proceed.  Visit Date: 05/25/2022  Today's healthcare provider: Penni Homans, MD     Subjective:    Patient ID: Autumn Myers, female    DOB: 02/09/1961, 61 y.o.   MRN: [id number]  No chief complaint on file.   HPI Patient is in today for follow up on chronic medical concerns, no recent febrile illness or acute hospitalizations. Denies CP/palp/SOB/HA/congestion/fevers/GI or GU c/o. Taking meds as prescribed   Past Medical History:  Diagnosis Date   Abnormal results of thyroid function studies 01/28/2013   Allergic state 11/20/2011   Allergy    seasonal   Anxiety    Cancer ([place])    basal cell   Chicken pox as a child   Depression    Depression with anxiety    Headache(784.0) 11/20/2011   Hematuria 01/29/2012   Hip pain, right 01/29/2012   Myalgia 11/20/2011   Neck pain 05/13/2013   Other and unspecified hyperlipidemia 05/13/2013   Personal history of colonic adenoma 06/23/2013   06/23/2013 2 dminutive polyps     Preventative health care 10/13/2011   Puncture wound of thigh, right 02/03/2012   Raynaud phenomenon 04/15/2014   Renal  lithiasis 01/29/2012   Sinusitis acute 01/29/2012   Sinusitis, acute 01/29/2012   Smoker    Vitamin D deficiency    Wound infection 06/20/2012    Past Surgical History:  Procedure Laterality Date   CARPAL TUNNEL RELEASE     on right and left   CESAREAN SECTION  1994   COLONOSCOPY  2014   COLONOSCOPY W/ POLYPECTOMY  2014   1 was precancerous   LITHOTRIPSY     POLYPECTOMY      Family History  Problem Relation Age of Onset   Hypertension Mother    Parkinsonism Mother    Heart failure Mother    Stroke Mother    Stroke Father    Hypertension Father    Cancer Father        leukemia   Colon polyps Father    Depression Sister    Anxiety disorder Sister    Colon polyps Sister    Colon polyps Brother    Parkinsonism Maternal Grandmother    Heart disease Maternal Grandfather    Colon cancer Neg Hx    Stomach cancer Neg Hx    Esophageal cancer Neg Hx    Rectal cancer Neg Hx     Social History   Socioeconomic History   Marital status: Divorced    Spouse name: Not on file   Number of children: 2   Years of education: Not on file   Highest education level: High school graduate  Occupational History   Not  on file  Tobacco Use   Smoking status: Every Day    Packs/day: 0.50    Years: 15.00    Total pack years: 7.50    Types: Cigarettes   Smokeless tobacco: Never   Tobacco comments:    less than a pack a day  Vaping Use   Vaping Use: Never used  Substance and Sexual Activity   Alcohol use: No   Drug use: No   Sexual activity: Not Currently    Partners: Male    Birth control/protection: None  Other Topics Concern   Not on file  Social History Narrative   Not on file   Social Determinants of Health   Financial Resource Strain: Not on file  Food Insecurity: Not on file  Transportation Needs: No Transportation Needs (01/02/2021)   PRAPARE - Hydrologist (Medical): No    Lack of Transportation (Non-Medical): No  Physical Activity: Not  on file  Stress: Not on file  Social Connections: Not on file  Intimate Partner Violence: Not on file    Outpatient Medications Prior to Visit  Medication Sig Dispense Refill   ALPRAZolam (XANAX) 0.5 MG tablet Take 1 tablet (0.5 mg total) by mouth daily as needed for anxiety. 20 tablet 0   aspirin 81 MG chewable tablet Chew by mouth daily.     Biotin 10000 MCG TABS Take 1 tablet by mouth daily.     citalopram (CELEXA) 40 MG tablet Take 1 tablet by mouth once daily 90 tablet 0   cyclobenzaprine (FLEXERIL) 10 MG tablet TAKE 1 TABLET BY MOUTH ONCE DAILY AT BEDTIME AS NEEDED FOR MUSCLE SPASM 30 tablet 0   estradiol (ESTRACE VAGINAL) 0.1 MG/GM vaginal cream Place 1 g vaginally 3 (three) times a week. 42.5 g 12   loratadine (CLARITIN) 10 MG tablet Take 10 mg by mouth daily.     meclizine (ANTIVERT) 25 MG tablet Take 1 tablet (25 mg total) by mouth 3 (three) times daily as needed for dizziness. 30 tablet 0   Multiple Vitamin (MULTIVITAMIN) tablet Take 1 tablet by mouth daily.     tiZANidine (ZANAFLEX) 2 MG tablet Take 0.5-2 tablets (1-4 mg total) by mouth 2 (two) times daily as needed for muscle spasms. 40 tablet 0   traZODone (DESYREL) 50 MG tablet Take 0.5-1 tablets (25-50 mg total) by mouth 2 (two) times daily. 120 tablet 3   No facility-administered medications prior to visit.    Allergies  Allergen Reactions   Eggs Or Egg-Derived Products Nausea And Vomiting    cramping   Minocycline Nausea And Vomiting   Neosporin [Neomycin-Bacitracin Zn-Polymyx] Itching    Inflamed, itchy    Penicillins Hives   Codeine     GI upset    Review of Systems  Constitutional:  Negative for fever and malaise/fatigue.  HENT:  Negative for congestion.   Eyes:  Negative for blurred vision.  Respiratory:  Negative for shortness of breath.   Cardiovascular:  Negative for chest pain, palpitations and leg swelling.  Gastrointestinal:  Negative for abdominal pain, blood in stool and nausea.  Genitourinary:   Negative for dysuria and frequency.  Musculoskeletal:  Negative for falls.  Skin:  Negative for rash.  Neurological:  Negative for dizziness, loss of consciousness and headaches.  Endo/Heme/Allergies:  Negative for environmental allergies.  Psychiatric/Behavioral:  Negative for depression. The patient is not nervous/anxious.        Objective:    Physical Exam Constitutional:  General: She is not in acute distress.    Appearance: She is well-developed.  HENT:     Head: Normocephalic and atraumatic.  Eyes:     Conjunctiva/sclera: Conjunctivae normal.  Neck:     Thyroid: No thyromegaly.  Cardiovascular:     Rate and Rhythm: Normal rate and regular rhythm.     Heart sounds: Normal heart sounds. No murmur heard. Pulmonary:     Effort: Pulmonary effort is normal. No respiratory distress.     Breath sounds: Normal breath sounds.  Abdominal:     General: Bowel sounds are normal. There is no distension.     Palpations: Abdomen is soft. There is no mass.     Tenderness: There is no abdominal tenderness.  Musculoskeletal:     Cervical back: Neck supple.  Lymphadenopathy:     Cervical: No cervical adenopathy.  Skin:    General: Skin is warm and dry.  Neurological:     Mental Status: She is alert and oriented to person, place, and time.  Psychiatric:        Behavior: Behavior normal.     LMP 11/06/2008  Wt Readings from Last 3 Encounters:  05/15/22 102 lb (46.3 kg)  04/07/22 104 lb (47.2 kg)  12/04/21 105 lb (47.6 kg)    Diabetic Foot Exam - Simple   No data filed    Lab Results  Component Value Date   WBC 6.7 04/07/2022   HGB 13.4 04/07/2022   HCT 39.5 04/07/2022   PLT 296.0 04/07/2022   GLUCOSE 74 04/07/2022   CHOL 192 12/05/2021   TRIG 106.0 12/05/2021   HDL 60.50 12/05/2021   LDLCALC 110 (H) 12/05/2021   ALT 14 04/07/2022   AST 19 04/07/2022   NA 141 04/07/2022   K 3.9 04/07/2022   CL 105 04/07/2022   CREATININE 0.86 04/07/2022   BUN 9 04/07/2022    CO2 30 04/07/2022   TSH 2.12 12/05/2021    Lab Results  Component Value Date   TSH 2.12 12/05/2021   Lab Results  Component Value Date   WBC 6.7 04/07/2022   HGB 13.4 04/07/2022   HCT 39.5 04/07/2022   MCV 93.9 04/07/2022   PLT 296.0 04/07/2022   Lab Results  Component Value Date   NA 141 04/07/2022   K 3.9 04/07/2022   CO2 30 04/07/2022   GLUCOSE 74 04/07/2022   BUN 9 04/07/2022   CREATININE 0.86 04/07/2022   BILITOT 0.5 04/07/2022   ALKPHOS 68 04/07/2022   AST 19 04/07/2022   ALT 14 04/07/2022   PROT 6.4 04/07/2022   ALBUMIN 4.2 04/07/2022   CALCIUM 9.7 04/07/2022   GFR 73.56 04/07/2022   Lab Results  Component Value Date   CHOL 192 12/05/2021   Lab Results  Component Value Date   HDL 60.50 12/05/2021   Lab Results  Component Value Date   LDLCALC 110 (H) 12/05/2021   Lab Results  Component Value Date   TRIG 106.0 12/05/2021   Lab Results  Component Value Date   CHOLHDL 3 12/05/2021   No results found for: "HGBA1C"     Assessment & Plan:   Problem List Items Addressed This Visit     Depression with anxiety    Struggles with anhedonia and anxiety      Vitamin D deficiency    Supplement and monitor      Smoker    Encouraged complete cessation      Headache    Encouraged increased hydration, 64  ounces of clear fluids daily. Minimize alcohol and caffeine. Eat small frequent meals with lean proteins and complex carbs. Avoid high and low blood sugars. Get adequate sleep, 7-8 hours a night. Needs exercise daily preferably in the morning.      Myalgia    Hydrate and monitor      Hyperlipidemia    Encourage heart healthy diet such as MIND or DASH diet, increase exercise, avoid trans fats, simple carbohydrates and processed foods, consider a krill or fish or flaxseed oil cap daily.       Low back pain    Encouraged moist heat and gentle stretching as tolerated. May try NSAIDs and prescription meds as directed and report if symptoms worsen or  seek immediate care       I am having Autumn Myers maintain her loratadine, Biotin, multivitamin, tiZANidine, cyclobenzaprine, meclizine, ALPRAZolam, traZODone, aspirin, estradiol, and citalopram.  No orders of the defined types were placed in this encounter.   I discussed the assessment and treatment plan with the patient. The patient was provided an opportunity to ask questions and all were answered. The patient agreed with the plan and demonstrated an understanding of the instructions.   The patient was advised to call back or seek an in-person evaluation if the symptoms worsen or if the condition fails to improve as anticipated.  I provided *** minutes of face-to-face time during this encounter.   Penni Homans, MD [facility] [phone] (phone) [phone] (fax)  [place]

## 2022-05-24 NOTE — Assessment & Plan Note (Signed)
Supplement and monitor 

## 2022-05-24 NOTE — Assessment & Plan Note (Signed)
Encourage heart healthy diet such as MIND or DASH diet, increase exercise, avoid trans fats, simple carbohydrates and processed foods, consider a krill or fish or flaxseed oil cap daily.  °

## 2022-05-24 NOTE — Assessment & Plan Note (Signed)
Encouraged complete cessation, she does not feel ready

## 2022-05-25 ENCOUNTER — Telehealth (INDEPENDENT_AMBULATORY_CARE_PROVIDER_SITE_OTHER): Payer: Commercial Managed Care - HMO | Admitting: Family Medicine

## 2022-05-25 DIAGNOSIS — M791 Myalgia, unspecified site: Secondary | ICD-10-CM | POA: Diagnosis not present

## 2022-05-25 DIAGNOSIS — M545 Low back pain, unspecified: Secondary | ICD-10-CM

## 2022-05-25 DIAGNOSIS — F418 Other specified anxiety disorders: Secondary | ICD-10-CM

## 2022-05-25 DIAGNOSIS — R519 Headache, unspecified: Secondary | ICD-10-CM

## 2022-05-25 DIAGNOSIS — G4700 Insomnia, unspecified: Secondary | ICD-10-CM

## 2022-05-25 DIAGNOSIS — F17200 Nicotine dependence, unspecified, uncomplicated: Secondary | ICD-10-CM

## 2022-05-25 DIAGNOSIS — E559 Vitamin D deficiency, unspecified: Secondary | ICD-10-CM | POA: Diagnosis not present

## 2022-05-25 DIAGNOSIS — E785 Hyperlipidemia, unspecified: Secondary | ICD-10-CM

## 2022-05-25 DIAGNOSIS — N952 Postmenopausal atrophic vaginitis: Secondary | ICD-10-CM

## 2022-05-25 MED ORDER — CITALOPRAM HYDROBROMIDE 40 MG PO TABS: 40.0000 mg | ORAL_TABLET | Freq: Every day | ORAL | 1 refills | Status: DC

## 2022-05-25 NOTE — Assessment & Plan Note (Addendum)
Encouraged good sleep hygiene such as dark, quiet room. No blue/green glowing lights such as computer screens in bedroom. No alcohol or stimulants in evening. Cut down on caffeine as able. Regular exercise is helpful but not just prior to bed time. Trazodone at 25-50 mg helps but the higher dose can leave her groggy the next morning

## 2022-05-26 ENCOUNTER — Telehealth: Payer: Commercial Managed Care - HMO | Admitting: Family Medicine

## 2022-06-28 IMAGING — US US ABDOMEN COMPLETE
1 series · 14 of 25 positions shown · non-contrast
Comparison: None.

CLINICAL DATA: Elevated liver function tests

EXAM:
ABDOMEN ULTRASOUND COMPLETE

[Series 1: us abdomen complete · 14 of 81 slices shown]
[im 1/81]
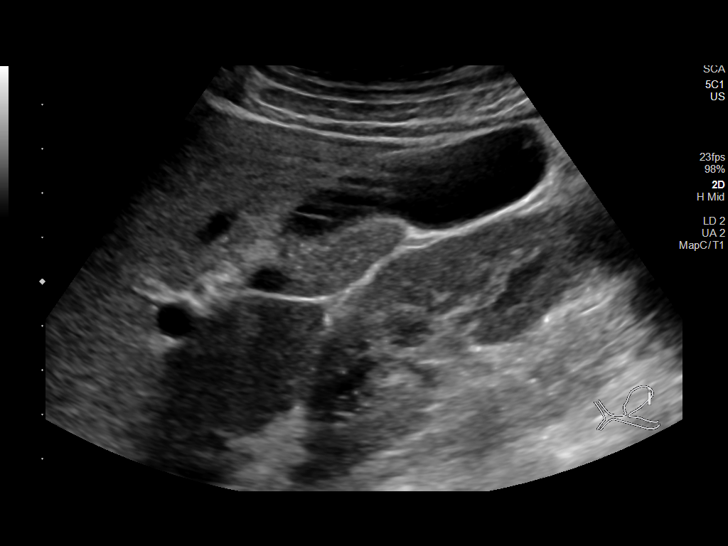
[im 7/81]
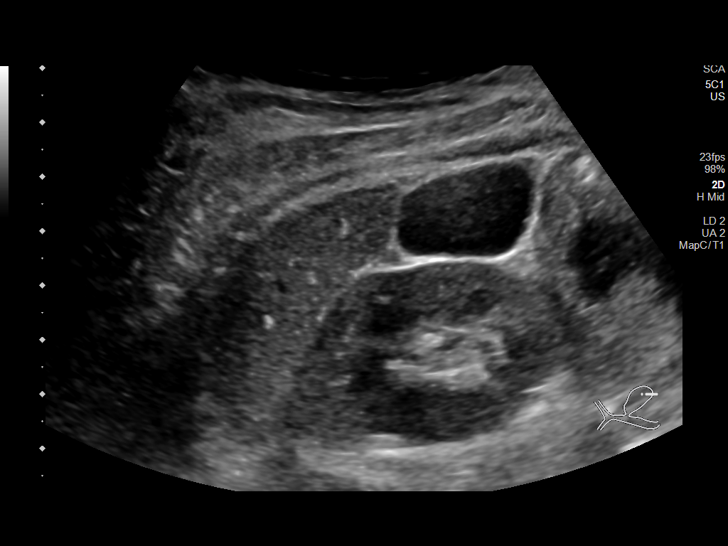
[im 14/81]
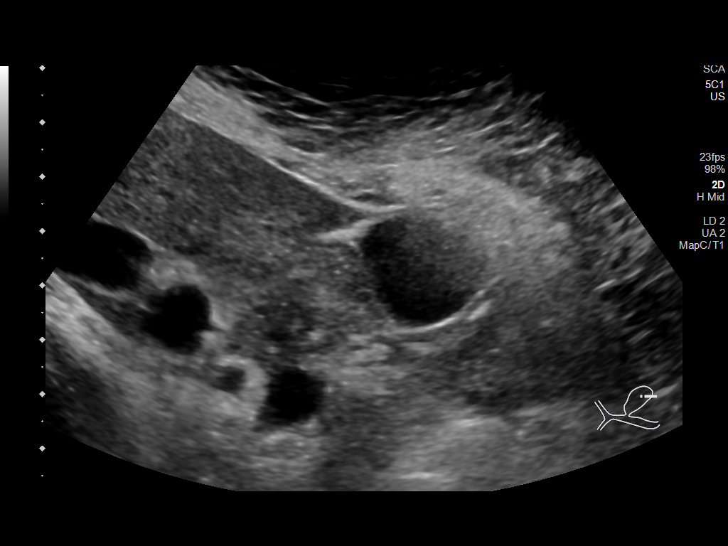
[im 21/81]
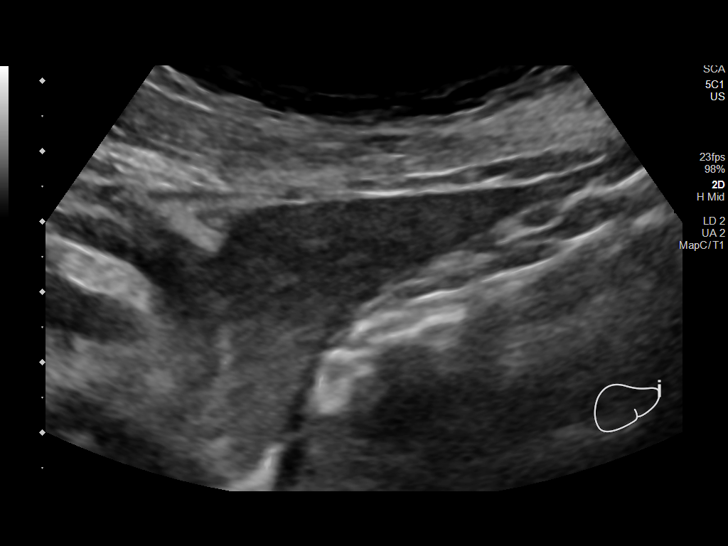
[im 27/81]
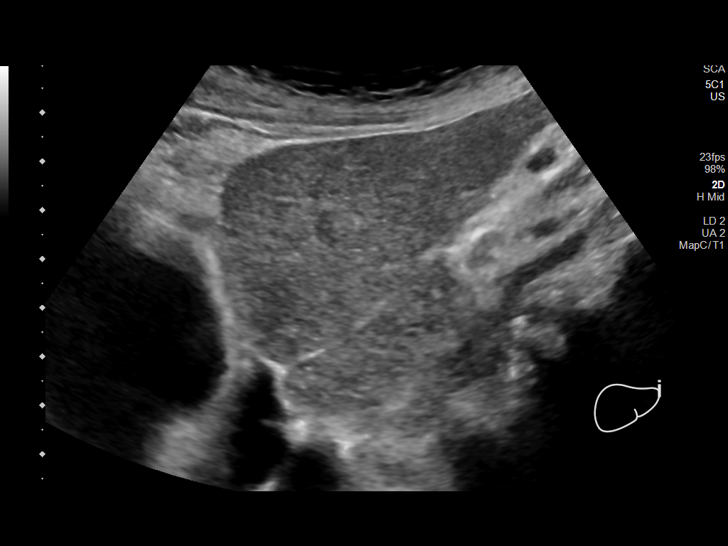
[im 31/81]
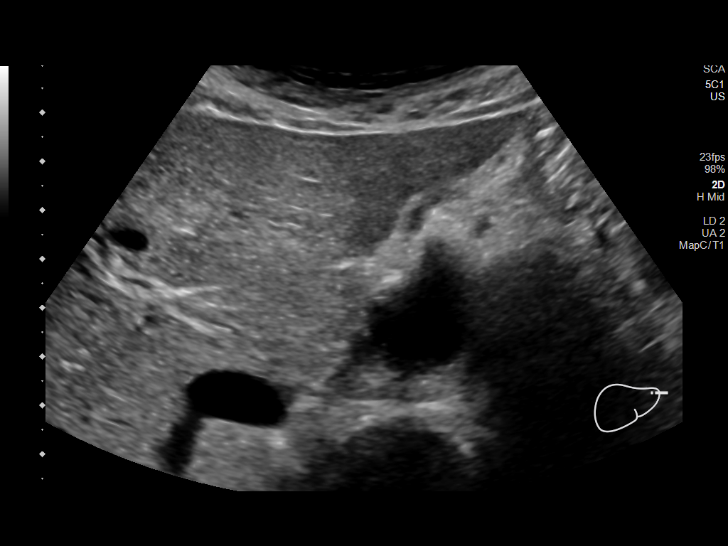
[im 37/81]
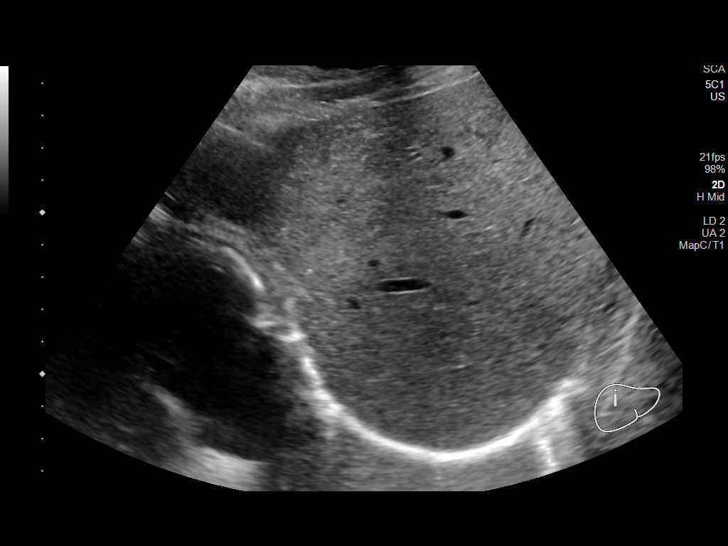
[im 44/81]
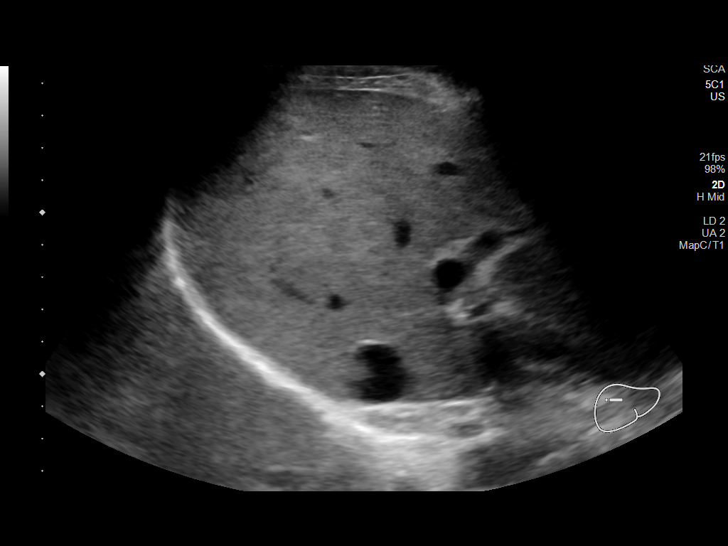
[im 51/81]
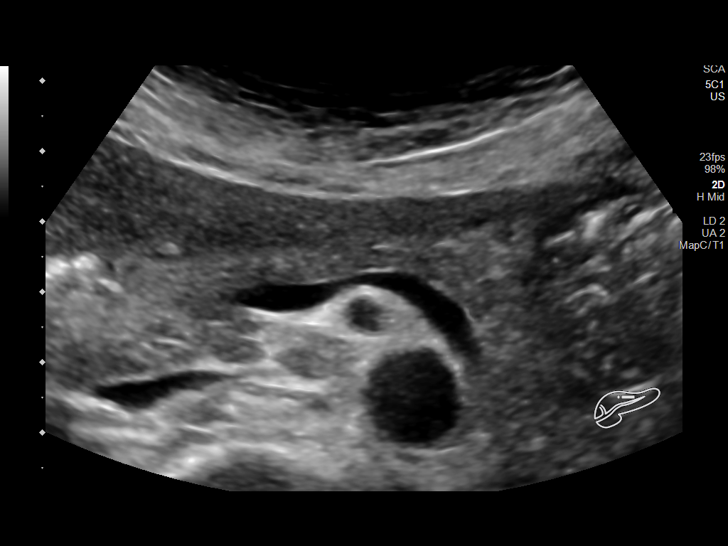
[im 54/81]
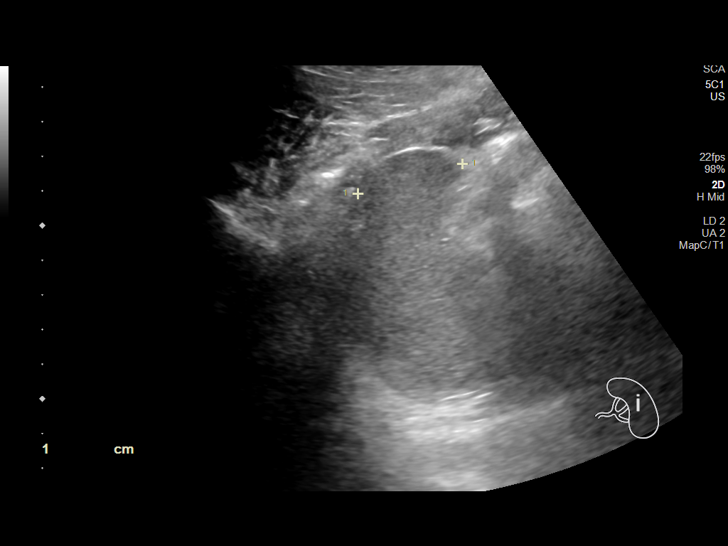
[im 61/81]
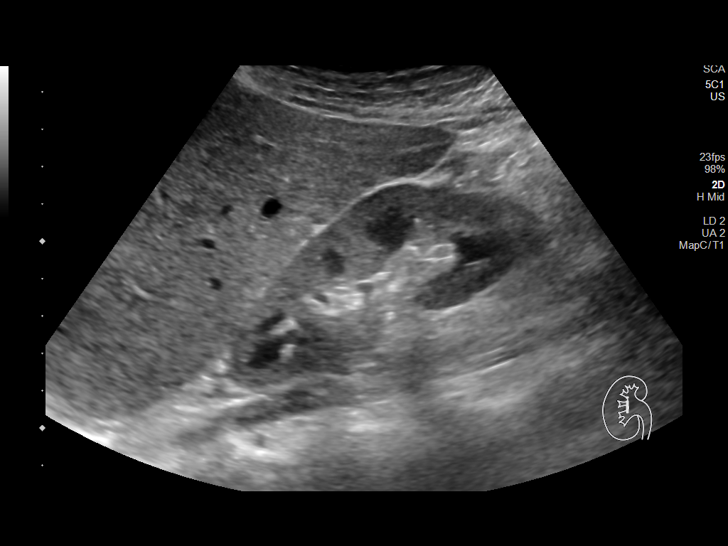
[im 67/81]
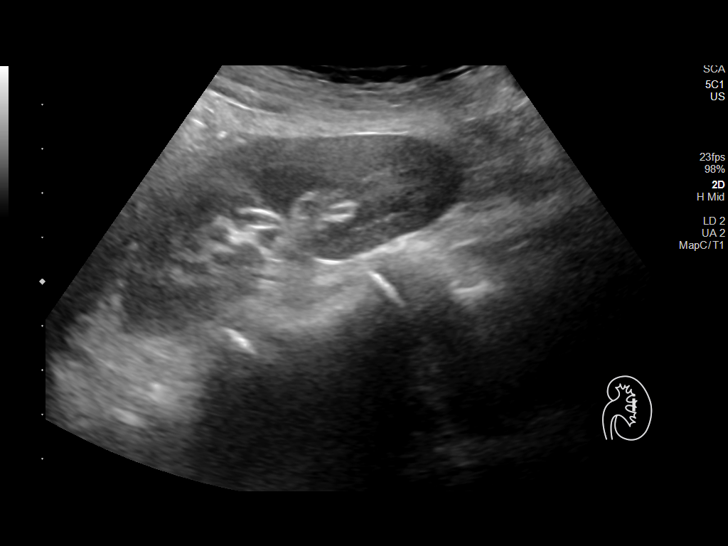
[im 74/81]
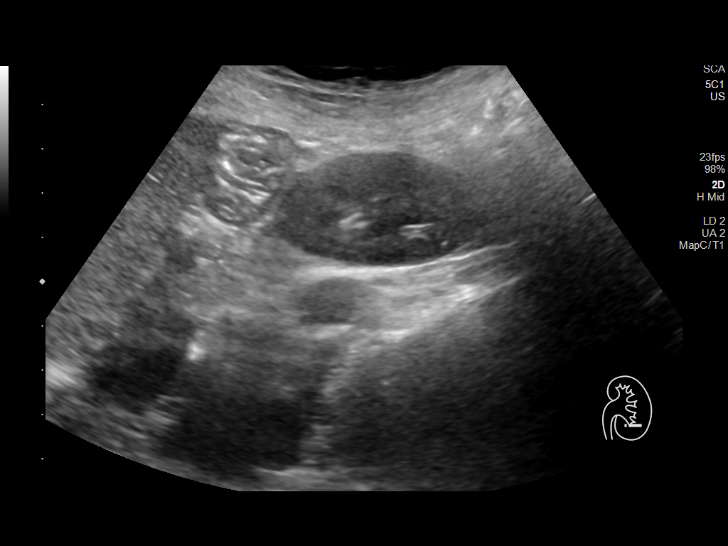
[im 81/81]
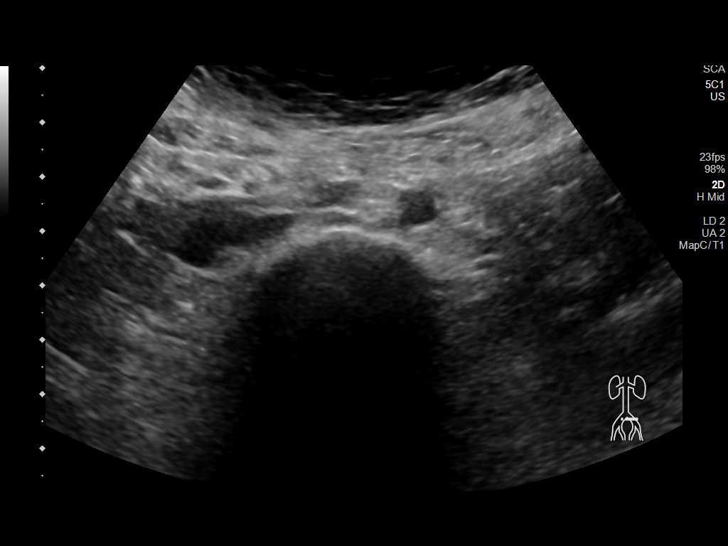

[14 of 25 positions shown; findings below may reference images not displayed]

FINDINGS: Gallbladder: No gallstones or wall thickening visualized. No
sonographic Murphy sign noted by sonographer.

Common bile duct: Diameter: 3.5 mm.

Liver: No focal lesion identified. Within normal limits in
parenchymal echogenicity. Portal vein is patent on color Doppler
imaging with normal direction of blood flow towards the liver.

IVC: No abnormality visualized.

Pancreas: Visualized portion unremarkable.

Spleen: Size and appearance within normal limits.

Right Kidney: Length: 9.6 cm. Echogenicity within normal limits. No
mass or hydronephrosis visualized.

Left Kidney: Length: 8.6 cm. Echogenicity within normal limits. No
mass or hydronephrosis visualized.

Abdominal aorta: No aneurysm visualized.

Other findings: None.
IMPRESSION: Unremarkable ultrasound of the abdomen.

## 2022-12-08 ENCOUNTER — Ambulatory Visit (HOSPITAL_BASED_OUTPATIENT_CLINIC_OR_DEPARTMENT_OTHER)
Admission: RE | Admit: 2022-12-08 | Discharge: 2022-12-08 | Disposition: A | Discharge status: Home / Self Care | Payer: Self-pay | Source: Ambulatory Visit | Attending: Family Medicine | Admitting: Family Medicine

## 2022-12-08 ENCOUNTER — Ambulatory Visit (INDEPENDENT_AMBULATORY_CARE_PROVIDER_SITE_OTHER): Payer: Self-pay | Admitting: Family Medicine

## 2022-12-08 VITALS — BP 131/82 | HR 76 | Ht 61.0 in | Wt 100.0 lb

## 2022-12-08 DIAGNOSIS — R61 Generalized hyperhidrosis: Secondary | ICD-10-CM | POA: Insufficient documentation

## 2022-12-08 DIAGNOSIS — R0789 Other chest pain: Secondary | ICD-10-CM

## 2022-12-08 DIAGNOSIS — R9431 Abnormal electrocardiogram [ECG] [EKG]: Secondary | ICD-10-CM

## 2022-12-08 LAB — CBC WITH DIFFERENTIAL/PLATELET
Basophils Absolute: 0 10*3/uL (ref 0.0–0.1)
Basophils Relative: 0.8 % (ref 0.0–3.0)
Eosinophils Absolute: 0.1 10*3/uL (ref 0.0–0.7)
Eosinophils Relative: 1.3 % (ref 0.0–5.0)
HCT: 41.7 % (ref 36.0–46.0)
Hemoglobin: 14.3 g/dL (ref 12.0–15.0)
Lymphocytes Relative: 28.7 % (ref 12.0–46.0)
Lymphs Abs: 1.7 10*3/uL (ref 0.7–4.0)
MCHC: 34.4 g/dL (ref 30.0–36.0)
MCV: 93.5 fl (ref 78.0–100.0)
Monocytes Absolute: 0.4 10*3/uL (ref 0.1–1.0)
Monocytes Relative: 7 % (ref 3.0–12.0)
Neutro Abs: 3.6 10*3/uL (ref 1.4–7.7)
Neutrophils Relative %: 62.2 % (ref 43.0–77.0)
Platelets: 321 10*3/uL (ref 150.0–400.0)
RBC: 4.46 Mil/uL (ref 3.87–5.11)
RDW: 12.9 % (ref 11.5–15.5)
WBC: 5.8 10*3/uL (ref 4.0–10.5)

## 2022-12-08 LAB — TSH: TSH: 2.34 u[IU]/mL (ref 0.35–5.50)

## 2022-12-08 LAB — COMPREHENSIVE METABOLIC PANEL
ALT: 15 U/L (ref 0–35)
AST: 18 U/L (ref 0–37)
Albumin: 4.5 g/dL (ref 3.5–5.2)
Alkaline Phosphatase: 61 U/L (ref 39–117)
BUN: 12 mg/dL (ref 6–23)
CO2: 32 mEq/L (ref 19–32)
Calcium: 10.3 mg/dL (ref 8.4–10.5)
Chloride: 102 mEq/L (ref 96–112)
Creatinine, Ser: 0.94 mg/dL (ref 0.40–1.20)
GFR: 65.8 mL/min (ref >60.00)
Glucose, Bld: 65 mg/dL — ABNORMAL LOW (ref 70–99)
Potassium: 4.1 mEq/L (ref 3.5–5.1)
Sodium: 141 mEq/L (ref 135–145)
Total Bilirubin: 0.5 mg/dL (ref 0.2–1.2)
Total Protein: 6.8 g/dL (ref 6.0–8.3)

## 2022-12-08 LAB — POC URINALSYSI DIPSTICK (AUTOMATED)
Bilirubin, UA: NEGATIVE
Blood, UA: NEGATIVE
Glucose, UA: NEGATIVE
Ketones, UA: NEGATIVE
Leukocytes, UA: NEGATIVE
Nitrite, UA: NEGATIVE
Protein, UA: NEGATIVE
Spec Grav, UA: <=1.005 — AB (ref 1.010–1.025)
Urobilinogen, UA: 0.2 E.U./dL
pH, UA: 6.5 (ref 5.0–8.0)

## 2022-12-08 LAB — C-REACTIVE PROTEIN: CRP: <1 mg/dL (ref 0.5–20.0)

## 2022-12-08 LAB — EKG 12-LEAD

## 2022-12-08 NOTE — Patient Instructions (Signed)
Starting workup for unexplained sweats. We will let you know as results come back.  I will go ahead and place a cardiac referral for the chest pain.   Please contact office for follow-up if symptoms do not improve or worsen. Seek emergency care if symptoms become severe.

## 2022-12-08 NOTE — Progress Notes (Signed)
Acute Office Visit  Subjective:     Patient ID: Autumn Myers, female    DOB: 1960/08/10, 62 y.o.   MRN: [id number]  Chief Complaint  Patient presents with   Excessive Sweating    HPI Patient is in today for night sweats.   Patient reports she has been struggling with night sweats for a while now.  States she has had a menopausal type workup which was unremarkable, she does not feel like this is menopause related.  She is denying any hot flashes during the day.  States these excessive sweats are only occurring when asleep, at night or during a nap.  States she will wake up drenched as if she just gotten out of the pool, feeling chilled.  States this is happening multiple times per week.  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  She has noticed some increasing anxiety and is reporting some frequent chest pain.  States the chest pain is left-sided, and intermittent.  Describes the pain as a pressure/aching that is not predictable and not associated with dyspnea numbness, lightheadedness, dyspnea, left arm/jaw pain.  States she does feel very fatigued at times.  She is worried that something is wrong with her heart as she does have a family history.  She would like [place] to work this up further. She denies any fevers, cough, dyspnea, rashes, purulent sputum, weight loss, edema, vision changes, headaches, palpitations.      ROS All review of systems negative except what is listed in the HPI      Objective:    BP 131/82   Pulse 76   Ht 5\' 1"  (1.549 m)   Wt 100 lb (45.4 kg)   LMP 11/06/2008   SpO2 98%   BMI 18.89 kg/m    Physical Exam Vitals reviewed.  Constitutional:      Appearance: Normal appearance.  Cardiovascular:     Rate and Rhythm: Normal rate and regular rhythm.     Pulses: Normal pulses.     Heart sounds: Normal heart sounds.  Pulmonary:     Effort: Pulmonary effort is normal.      Breath sounds: Normal breath sounds.  Musculoskeletal:     Cervical back: No tenderness.     Right lower leg: No edema.     Left lower leg: No edema.  Lymphadenopathy:     Cervical: No cervical adenopathy.  Skin:    General: Skin is warm and dry.  Neurological:     Mental Status: She is alert and oriented to person, place, and time.  Psychiatric:        Mood and Affect: Mood normal.        Behavior: Behavior normal.        Thought Content: Thought content normal.        Judgment: Judgment normal.     Results for orders placed or performed in visit on 12/08/22  POCT Urinalysis Dipstick (Automated)  Result Value Ref Range   Color, UA yellow    Clarity, UA clear    Glucose, UA Negative Negative   Bilirubin, UA negative    Ketones, UA negative    Spec Grav, UA <=1.005 (A) 1.010 - 1.025   Blood, UA negative    pH, UA 6.5 5.0 - 8.0   Protein, UA Negative Negative   Urobilinogen, UA 0.2 0.2 or 1.0 E.U./dL   Nitrite, UA  negative    Leukocytes, UA Negative Negative  EKG 12-Lead  Result Value Ref Range   EKG 12 lead          Assessment & Plan:   Problem List Items Addressed This Visit   None Visit Diagnoses     Unexplained night sweats    -  Primary Uncertain etiology, but seems more intense than menopausal symptoms and only occurring when she sleeps. Starting workup. UA negative. Labs and CXR ordered.     Relevant Orders   DG Chest 2 View   QuantiFERON-TB Gold Plus   CBC w/Diff   TSH   HIV Antibody (routine testing w rflx)   Comprehensive metabolic panel   C-reactive protein   POCT Urinalysis Dipstick (Automated) (Completed)   Chest discomfort     Abnormal EKG  There could be an anxiety component, but given frequent symptoms and abnormal EKG, will go ahead and get cardiology input. Patient aware of signs/symptoms requiring further/urgent evaluation.  EKG = SR 66 bpm, poor R wave progression, possible old anterior infarct, no ST changes today concerning for  ischemia    Relevant Orders   EKG 12-Lead (Completed)   DG Chest 2 View   Ambulatory referral to Cardiology                   No orders of the defined types were placed in this encounter.   Return if symptoms worsen or fail to improve.  Clayborne Dana, NP

## 2022-12-09 LAB — HIV ANTIBODY (ROUTINE TESTING W REFLEX): HIV 1&2 Ab, 4th Generation: NONREACTIVE

## 2022-12-10 LAB — QUANTIFERON-TB GOLD PLUS
Mitogen-NIL: >10 IU/mL
NIL: 0.02 IU/mL
QuantiFERON-TB Gold Plus: NEGATIVE
TB1-NIL: 0 IU/mL
TB2-NIL: 0 IU/mL

## 2022-12-14 ENCOUNTER — Ambulatory Visit: Payer: Self-pay | Attending: Internal Medicine | Admitting: Internal Medicine

## 2022-12-14 ENCOUNTER — Encounter: Payer: Self-pay | Admitting: Internal Medicine

## 2022-12-14 VITALS — BP 107/70 | HR 77 | Ht 61.0 in | Wt 100.6 lb

## 2022-12-14 DIAGNOSIS — R079 Chest pain, unspecified: Secondary | ICD-10-CM

## 2022-12-14 NOTE — Patient Instructions (Signed)
Medication Instructions:  Your physician recommends that you continue on your current medications as directed. Please refer to the Current Medication list given to you today.  *If you need a refill on your cardiac medications before your next appointment, please call your pharmacy*  Follow-Up: At [facility], you and your health needs are our priority.  As part of our continuing mission to provide you with exceptional heart care, we have created designated Provider Care Teams.  These Care Teams include your primary Cardiologist (physician) and Advanced Practice Providers (APPs -  Physician Assistants and Nurse Practitioners) who all work together to provide you with the care you need, when you need it.  We recommend signing up for the patient portal called "MyChart".  Sign up information is provided on this After Visit Summary.  MyChart is used to connect with patients for Virtual Visits (Telemedicine).  Patients are able to view lab/test results, encounter notes, upcoming appointments, etc.  Non-urgent messages can be sent to your provider as well.   To learn more about what you can do with MyChart, go to [email or web address].    Your next appointment:   AS NEEDED with Dr. Branch        

## 2022-12-14 NOTE — Progress Notes (Signed)
Cardiology Office Note:    Date:  12/14/2022   ID:  Autumn Myers, DOB 08/10/1960, MRN [id number]  PCP:  Bradd Canary, MD   [facility] Providers Cardiologist:  None     Referring MD: Clayborne Dana, NP   No chief complaint on file. Atypical cp  History of Present Illness:    Autumn Myers is a 62 y.o. female with a hx of smoker for a long time,  basal cell CA, anxiety, referral for to cardiology for atypical CP. She reported a menopausal symptoms to her PCP.  She noted anxiety as well with associated CP. Her EKG on 5/7 did not show st-t changes or Q waves. She has no hx of cardiac dx. Concern for anxiety but PCP wanted to ensure not cardiac related.  She is very active, but no physical exercise. No limiting DOE or CP. She has chest pain with anxiety; notes it feels weird and heavy. No LE edema. No c/o orthopnea/PND. No syncope  The 10-year ASCVD risk score (Arnett DK, et al., 2019) is: 4.5%   Values used to calculate the score:     Age: 46 years     Sex: Female     Is Non-Hispanic African American: No     Diabetic: No     Tobacco smoker: Yes     Systolic Blood Pressure: 107 mmHg     Is BP treated: No     HDL Cholesterol: 60.5 mg/dL     Total Cholesterol: 192 mg/dL   Past Medical History:  Diagnosis Date   Abnormal results of thyroid function studies 01/28/2013   Allergic state 11/20/2011   Allergy    seasonal   Anxiety    Cancer (HCC)    basal cell   Chicken pox as a child   Depression    Depression with anxiety    Headache(784.0) 11/20/2011   Hematuria 01/29/2012   Hip pain, right 01/29/2012   Myalgia 11/20/2011   Neck pain 05/13/2013   Other and unspecified hyperlipidemia 05/13/2013   Personal history of colonic adenoma 06/23/2013   06/23/2013 2 dminutive polyps     Preventative health care 10/13/2011   Puncture wound of thigh, right 02/03/2012   Raynaud phenomenon 04/15/2014   Renal lithiasis 01/29/2012   Sinusitis acute 01/29/2012   Sinusitis, acute  01/29/2012   Smoker    Vitamin D deficiency    Wound infection 06/20/2012    Past Surgical History:  Procedure Laterality Date   CARPAL TUNNEL RELEASE     on right and left   CESAREAN SECTION  1994   COLONOSCOPY  2014   COLONOSCOPY W/ POLYPECTOMY  2014   1 was precancerous   LITHOTRIPSY     POLYPECTOMY      Current Medications: Current Meds  Medication Sig   ALPRAZolam (XANAX) 0.5 MG tablet Take 1 tablet (0.5 mg total) by mouth daily as needed for anxiety.   aspirin 81 MG chewable tablet Chew by mouth daily.   Biotin 40981 MCG TABS Take 1 tablet by mouth daily.   citalopram (CELEXA) 40 MG tablet Take 1 tablet (40 mg total) by mouth daily.   cyclobenzaprine (FLEXERIL) 10 MG tablet TAKE 1 TABLET BY MOUTH ONCE DAILY AT BEDTIME AS NEEDED FOR MUSCLE SPASM   loratadine (CLARITIN) 10 MG tablet Take 10 mg by mouth daily.   meclizine (ANTIVERT) 25 MG tablet Take 1 tablet (25 mg total) by mouth 3 (three) times daily as needed for dizziness.  Multiple Vitamin (MULTIVITAMIN) tablet Take 1 tablet by mouth daily.   tiZANidine (ZANAFLEX) 2 MG tablet Take 0.5-2 tablets (1-4 mg total) by mouth 2 (two) times daily as needed for muscle spasms.   traZODone (DESYREL) 50 MG tablet Take 0.5-1 tablets (25-50 mg total) by mouth 2 (two) times daily.     Allergies:   Egg-derived products, Minocycline, Neosporin [neomycin-bacitracin zn-polymyx], Penicillins, and Codeine   Social History   Socioeconomic History   Marital status: Divorced    Spouse name: Not on file   Number of children: 2   Years of education: Not on file   Highest education level: 12th grade  Occupational History   Not on file  Tobacco Use   Smoking status: Every Day    Packs/day: 0.50    Years: 15.00    Additional pack years: 0.00    Total pack years: 7.50    Types: Cigarettes   Smokeless tobacco: Never   Tobacco comments:    less than a pack a day  Vaping Use   Vaping Use: Never used  Substance and Sexual Activity    Alcohol use: No   Drug use: No   Sexual activity: Not Currently    Partners: Male    Birth control/protection: None  Other Topics Concern   Not on file  Social History Narrative   Not on file   Social Determinants of Health   Financial Resource Strain: High Risk (12/08/2022)   Overall Financial Resource Strain (CARDIA)    Difficulty of Paying Living Expenses: Very hard  Food Insecurity: Food Insecurity Present (12/08/2022)   Hunger Vital Sign    Worried About Running Out of Food in the Last Year: Sometimes true    Ran Out of Food in the Last Year: Patient declined  Transportation Needs: Patient Declined (12/08/2022)   PRAPARE - Administrator, Civil Service (Medical): Patient declined    Lack of Transportation (Non-Medical): Patient declined  Physical Activity: Unknown (12/08/2022)   Exercise Vital Sign    Days of Exercise per Week: Patient declined    Minutes of Exercise per Session: Not on file  Stress: Patient Declined (12/08/2022)   [employer] of Occupational Health - Occupational Stress Questionnaire    Feeling of Stress : Patient declined  Social Connections: Unknown (12/08/2022)   Social Connection and Isolation Panel [NHANES]    Frequency of Communication with Friends and Family: Patient declined    Frequency of Social Gatherings with Friends and Family: Patient declined    Attends Religious Services: Patient declined    Database administrator or Organizations: Not on file    Attends Banker Meetings: Not on file    Marital Status: Patient declined     Family History: The patient's family history includes Anxiety disorder in her sister; Cancer in her father; Colon polyps in her brother, father, and sister; Depression in her sister; Heart disease in her maternal grandfather; Heart failure in her mother; Hypertension in her father and mother; Parkinsonism in her maternal grandmother and mother; Stroke in her father and mother. There is no history of  Colon cancer, Stomach cancer, Esophageal cancer, or Rectal cancer. Brother CAD Mother CHF  ROS:   Please see the history of present illness.     All other systems reviewed and are negative.  EKGs/Labs/Other Studies Reviewed:    The following studies were reviewed today:   EKG:  EKG is  ordered today.  The ekg ordered today demonstrates  Recent Labs: 12/08/2022: ALT 15; BUN 12; Creatinine, Ser 0.94; Hemoglobin 14.3; Platelets 321.0; Potassium 4.1; Sodium 141; TSH 2.34   Recent Lipid Panel    Component Value Date/Time   CHOL 192 12/05/2021 1112   TRIG 106.0 12/05/2021 1112   HDL 60.50 12/05/2021 1112   CHOLHDL 3 12/05/2021 1112   VLDL 21.2 12/05/2021 1112   LDLCALC 110 (H) 12/05/2021 1112     Risk Assessment/Calculations:     Physical Exam:    VS:  Vitals:   12/14/22 0843  BP: 107/70  Pulse: 77  SpO2: 99%      BP 107/70   Pulse 77   Ht 5\' 1"  (1.549 m)   Wt 100 lb 9.6 oz (45.6 kg)   LMP 11/06/2008   SpO2 99%   BMI 19.01 kg/m     Wt Readings from Last 3 Encounters:  12/14/22 100 lb 9.6 oz (45.6 kg)  12/08/22 100 lb (45.4 kg)  05/15/22 102 lb (46.3 kg)     GEN:  Well nourished, well developed in no acute distress HEENT: Normal NECK: No JVD; No carotid bruits CARDIAC: RRR, no murmurs, rubs, gallops RESPIRATORY:  Clear to auscultation without rales, wheezing or rhonchi  ABDOMEN: Soft, non-tender, non-distended MUSCULOSKELETAL:  No edema; No deformity  SKIN: Warm and dry NEUROLOGIC:  Alert and oriented x 3 PSYCHIATRIC:  Normal affect   ASSESSMENT:    Non cardiac CP: agree this is related to anxiety.  EKG R wave progression can be lead placement. We discussed this. Symptoms are atypical, ASCVD < 7.5%.  PLAN:    In order of problems listed above:  Agree CP related to anxiety. No ischemic w/u at this time. Discussed signs of CAD and if this were to arise, can let us know      Medication Adjustments/Labs and Tests Ordered: Current medicines are  reviewed at length with the patient today.  Concerns regarding medicines are outlined above.  No orders of the defined types were placed in this encounter.  No orders of the defined types were placed in this encounter.   Patient Instructions  Medication Instructions:  Your physician recommends that you continue on your current medications as directed. Please refer to the Current Medication list given to you today.  *If you need a refill on your cardiac medications before your next appointment, please call your pharmacy*  Follow-Up: At [facility], you and your health needs are our priority.  As part of our continuing mission to provide you with exceptional heart care, we have created designated Provider Care Teams.  These Care Teams include your primary Cardiologist (physician) and Advanced Practice Providers (APPs -  Physician Assistants and Nurse Practitioners) who all work together to provide you with the care you need, when you need it.  We recommend signing up for the patient portal called "MyChart".  Sign up information is provided on this After Visit Summary.  MyChart is used to connect with patients for Virtual Visits (Telemedicine).  Patients are able to view lab/test results, encounter notes, upcoming appointments, etc.  Non-urgent messages can be sent to your provider as well.   To learn more about what you can do with MyChart, go to [email or web address].    Your next appointment:   AS NEEDED with Dr. Wyline Mood    Signed, Maisie Fus, MD  12/14/2022 9:39 AM    [facility]

## 2022-12-15 ENCOUNTER — Telehealth: Payer: Self-pay | Admitting: Licensed Clinical Social Worker

## 2022-12-15 NOTE — Progress Notes (Signed)
Heart and Vascular Care Navigation  12/15/2022  Autumn Myers 04-May-1961 [id number]  Reason for Referral: uninsured Patient is participating in a Managed Medicaid Plan: No, referred for Medicaid and mailed CAFA  Engaged with patient by telephone for initial visit for Heart and Vascular Care Coordination.                                                                                                   Assessment:       LCSW called and spoke with pt today at [phone]. Introduced self, role, reason for call. Pt confirmed home address, PCP, no current insurance- lives with an [place]. Pt not currently formally employed as she is taking care of her mother and her brother both of who need care. She has been using other savings to assist with costs of living. Receiving no benefits, her son assists with food. Has access to housing, transportation and her medications. Overall doing okay, but worried about options for medical coverage. We discussed Medicaid expansion, pt previously wasn't eligible, unsure now. If not eligible for Medicaid I shared I would send her a copy of the [employer] and I can assist her with completing. She is okay with me sending this referral for Medicaid through [employer], I shared how it would be completed and that they will reach out to her, if they don't get her they will leave a voicemail with their contact information. No additional questions/concerns for this writer at this time. Encouraged her to call me as needed.                                 HRT/VAS Care Coordination     Patients Home Cardiology Office [facility]   Outpatient Care Team Social Worker   Social Worker Name: Octavio Graves, [place], [phone]   Living arrangements for the past 2 months Single Family Home   Lives with: Adult Children   Patient Current Insurance Coverage Self-Pay   Patient Has Concern With Paying Medical Bills Yes   Patient Concerns With Medical Bills  currently uninsured, previously was ineligible for Medicaid   Medical Bill Referrals: [employer] for Medicaid screening, will send CAFA if ineligible   Does Patient Have Prescription Coverage? No       Social History:                                                                             SDOH Screenings   Food Insecurity: No Food Insecurity (12/15/2022)  Recent Concern: Food Insecurity - Food Insecurity Present (12/08/2022)  Housing: Low Risk  (12/15/2022)  Transportation Needs: No Transportation Needs (12/15/2022)  Utilities: Not At Risk (12/15/2022)  Depression (PHQ2-9): Low Risk  (05/25/2022)  Recent Concern: Depression (PHQ2-9) -  High Risk (04/07/2022)  Financial Resource Strain: Medium Risk (12/15/2022)  Physical Activity: Unknown (12/08/2022)  Social Connections: Unknown (12/08/2022)  Stress: Patient Declined (12/08/2022)  Tobacco Use: High Risk (12/14/2022)    SDOH Interventions: Financial Resources:  Financial Strain Interventions: Other (Comment) (referred for SNAP/Medicaid with [place] [facility] through [facility]- also will mail CAFA if ineligible for Medicaid) DSS for financial assistance and Financial Counseling for [employer] Program  Food Insecurity:  Food Insecurity Interventions: Other (Comment) (pt states son usually buys food, will send FNS referral through [employer] along with Medicaid)  Housing Insecurity:  Housing Interventions: Intervention Not Indicated  Transportation:   Transportation Interventions: Intervention Not Indicated    Follow-up plan:   LCSW made referral to DSS in [place] for pt to be assessed for Medicaid and SNAP. If ineligible I have mailed her a CAFA application and I will f/u to ensure she has received it/f/u on Medicaid referral. Pt encouraged to call me as needed.

## 2022-12-23 ENCOUNTER — Telehealth: Payer: Self-pay | Admitting: Licensed Clinical Social Worker

## 2022-12-23 NOTE — Telephone Encounter (Signed)
H&V Care Navigation CSW Progress Note  Clinical Social Worker contacted patient by phone to f/u on referral to DSS for Medicaid. Was able to reach her this afternoon at [phone]. Pt shares that DSS called her but since we spoke her mother fell and broke her hip so she hasn't had time to call the caseworker back. I validated that she has a full plate, encouraged her to let me know if she speaks with them if she is eligible for not at this time. She was also sent a Haematologist. I remain available, pt agreeable to speaking with me next month.  Patient is participating in a Managed Medicaid Plan:  No, self pay only  SDOH Screenings   Food Insecurity: No Food Insecurity (12/15/2022)  Recent Concern: Food Insecurity - Food Insecurity Present (12/08/2022)  Housing: Low Risk  (12/15/2022)  Transportation Needs: No Transportation Needs (12/15/2022)  Utilities: Not At Risk (12/15/2022)  Depression (PHQ2-9): Low Risk  (05/25/2022)  Recent Concern: Depression (PHQ2-9) - High Risk (04/07/2022)  Financial Resource Strain: Medium Risk (12/15/2022)  Physical Activity: Unknown (12/08/2022)  Social Connections: Unknown (12/08/2022)  Stress: Patient Declined (12/08/2022)  Tobacco Use: High Risk (12/14/2022)   Octavio Graves, MSW, LCSW Clinical Social Worker II [facility] Heart/Vascular Care Navigation  [phone]- work cell phone (preferred) [phone]- desk phone    '

## 2023-01-21 ENCOUNTER — Ambulatory Visit: Payer: Self-pay | Admitting: Cardiology

## 2023-01-25 ENCOUNTER — Telehealth (HOSPITAL_BASED_OUTPATIENT_CLINIC_OR_DEPARTMENT_OTHER): Payer: Self-pay | Admitting: Licensed Clinical Social Worker

## 2023-01-25 NOTE — Telephone Encounter (Signed)
H&V Care Navigation CSW Progress Note  Clinical Social Worker contacted patient by phone to f/u on referral to DSS for Medicaid. Was not able to reach her this afternoon at [phone]. Left voicemail requesting call back with any updates. I remain available as needed.    Patient is participating in a Managed Medicaid Plan:  No, sel  SDOH Screenings   Food Insecurity: No Food Insecurity (12/15/2022)  Recent Concern: Food Insecurity - Food Insecurity Present (12/08/2022)  Housing: Low Risk  (12/15/2022)  Transportation Needs: No Transportation Needs (12/15/2022)  Utilities: Not At Risk (12/15/2022)  Depression (PHQ2-9): Low Risk  (05/25/2022)  Recent Concern: Depression (PHQ2-9) - High Risk (04/07/2022)  Financial Resource Strain: Medium Risk (12/15/2022)  Physical Activity: Unknown (12/08/2022)  Social Connections: Unknown (12/08/2022)  Stress: Patient Declined (12/08/2022)  Tobacco Use: High Risk (12/14/2022)     .Octavio Graves, MSW, LCSW Clinical Social Worker II [facility] Navigation  [phone]- work cell phone (preferred) [phone]- desk phone

## 2023-02-16 ENCOUNTER — Other Ambulatory Visit: Payer: Self-pay | Admitting: Family Medicine

## 2023-04-08 ENCOUNTER — Other Ambulatory Visit: Payer: Self-pay | Admitting: Family Medicine

## 2023-05-14 ENCOUNTER — Other Ambulatory Visit: Payer: Self-pay | Admitting: Family Medicine

## 2023-08-10 ENCOUNTER — Other Ambulatory Visit: Payer: Self-pay | Admitting: Family Medicine

## 2023-09-05 ENCOUNTER — Other Ambulatory Visit: Payer: Self-pay | Admitting: Family Medicine

## 2023-09-14 ENCOUNTER — Other Ambulatory Visit: Payer: Self-pay | Admitting: Family Medicine

## 2023-10-12 ENCOUNTER — Other Ambulatory Visit: Payer: Self-pay | Admitting: Family Medicine

## 2024-01-12 NOTE — Assessment & Plan Note (Signed)
 Supplement and monitor

## 2024-01-12 NOTE — Assessment & Plan Note (Signed)
 Hydrate and monitor

## 2024-01-12 NOTE — Assessment & Plan Note (Signed)
 Encourage heart healthy diet such as MIND or DASH diet, increase exercise, avoid trans fats, simple carbohydrates and processed foods, consider a krill or fish or flaxseed oil cap daily.

## 2024-01-13 ENCOUNTER — Ambulatory Visit (INDEPENDENT_AMBULATORY_CARE_PROVIDER_SITE_OTHER): Payer: Self-pay | Admitting: Family Medicine

## 2024-01-13 ENCOUNTER — Encounter: Payer: Self-pay | Admitting: Family Medicine

## 2024-01-13 VITALS — BP 106/70 | HR 58 | Resp 16 | Ht 61.0 in | Wt 100.8 lb

## 2024-01-13 DIAGNOSIS — F418 Other specified anxiety disorders: Secondary | ICD-10-CM

## 2024-01-13 DIAGNOSIS — E785 Hyperlipidemia, unspecified: Secondary | ICD-10-CM

## 2024-01-13 DIAGNOSIS — E559 Vitamin D deficiency, unspecified: Secondary | ICD-10-CM

## 2024-01-13 DIAGNOSIS — G4700 Insomnia, unspecified: Secondary | ICD-10-CM

## 2024-01-13 DIAGNOSIS — M791 Myalgia, unspecified site: Secondary | ICD-10-CM

## 2024-01-13 MED ORDER — CITALOPRAM HYDROBROMIDE 40 MG PO TABS: 40.0000 mg | ORAL_TABLET | Freq: Every day | ORAL | 1 refills | Status: DC

## 2024-01-13 MED ORDER — TRAZODONE HCL 50 MG PO TABS: 75.0000 mg | ORAL_TABLET | Freq: Every day | ORAL | 1 refills | Status: DC

## 2024-01-14 LAB — COMPREHENSIVE METABOLIC PANEL WITH GFR
ALT: 14 U/L (ref 0–35)
AST: 17 U/L (ref 0–37)
Albumin: 4.7 g/dL (ref 3.5–5.2)
Alkaline Phosphatase: 72 U/L (ref 39–117)
BUN: 12 mg/dL (ref 6–23)
CO2: 31 meq/L (ref 19–32)
Calcium: 9.9 mg/dL (ref 8.4–10.5)
Chloride: 101 meq/L (ref 96–112)
Creatinine, Ser: 1 mg/dL (ref 0.40–1.20)
GFR: 60.62 mL/min (ref >60.00)
Glucose, Bld: 84 mg/dL (ref 70–99)
Potassium: 4.1 meq/L (ref 3.5–5.1)
Sodium: 139 meq/L (ref 135–145)
Total Bilirubin: 0.3 mg/dL (ref 0.2–1.2)
Total Protein: 6.7 g/dL (ref 6.0–8.3)

## 2024-01-14 LAB — LIPID PANEL
Cholesterol: 182 mg/dL (ref 0–200)
HDL: 59.6 mg/dL (ref >39.00)
LDL Cholesterol: 99 mg/dL (ref 0–99)
NonHDL: 122.14
Total CHOL/HDL Ratio: 3
Triglycerides: 117 mg/dL (ref 0.0–149.0)
VLDL: 23.4 mg/dL (ref 0.0–40.0)

## 2024-01-14 LAB — CBC WITH DIFFERENTIAL/PLATELET
Basophils Absolute: 0.1 10*3/uL (ref 0.0–0.1)
Basophils Relative: 1.5 % (ref 0.0–3.0)
Eosinophils Absolute: 0.1 10*3/uL (ref 0.0–0.7)
Eosinophils Relative: 1.7 % (ref 0.0–5.0)
HCT: 41.5 % (ref 36.0–46.0)
Hemoglobin: 14 g/dL (ref 12.0–15.0)
Lymphocytes Relative: 32.6 % (ref 12.0–46.0)
Lymphs Abs: 2.4 10*3/uL (ref 0.7–4.0)
MCHC: 33.7 g/dL (ref 30.0–36.0)
MCV: 93 fl (ref 78.0–100.0)
Monocytes Absolute: 0.5 10*3/uL (ref 0.1–1.0)
Monocytes Relative: 7 % (ref 3.0–12.0)
Neutro Abs: 4.3 10*3/uL (ref 1.4–7.7)
Neutrophils Relative %: 57.2 % (ref 43.0–77.0)
Platelets: 305 10*3/uL (ref 150.0–400.0)
RBC: 4.47 Mil/uL (ref 3.87–5.11)
RDW: 12.7 % (ref 11.5–15.5)
WBC: 7.5 10*3/uL (ref 4.0–10.5)

## 2024-01-14 LAB — TSH: TSH: 4.03 u[IU]/mL (ref 0.35–5.50)

## 2024-01-14 LAB — VITAMIN D 25 HYDROXY (VIT D DEFICIENCY, FRACTURES): VITD: 39.93 ng/mL (ref 30.00–100.00)

## 2024-01-16 ENCOUNTER — Ambulatory Visit: Payer: Self-pay | Admitting: Family Medicine

## 2024-01-16 ENCOUNTER — Encounter: Payer: Self-pay | Admitting: Family Medicine

## 2024-01-16 NOTE — Assessment & Plan Note (Signed)
 Encouraged good sleep hygiene such as dark, quiet room. No blue/green glowing lights such as computer screens in bedroom. No alcohol or stimulants in evening. Cut down on caffeine as able. Regular exercise is helpful but not just prior to bed time.  Given refill on Trazodone 

## 2024-01-16 NOTE — Assessment & Plan Note (Signed)
 Refill given for Citalopram  which is helpful. Used only a couple of Alprazolam  right after her mom died but she is doing much better now and is still caring for her father full time.

## 2024-01-16 NOTE — Progress Notes (Signed)
 Subjective:    Patient ID: Autumn Myers, female    DOB: 1961-01-19, 63 y.o.   MRN: [id number]  Chief Complaint  Patient presents with  . Medical Management of Chronic Issues    Patient presents today for a year follow-up.  . Quality Metric Gaps    Mammogram, TDAP, zoster    HPI Patient is in today for follow up on chronic medical concerns. No recent febrile illness or hospitalizations. Denies CP/palp/SOB/HA/congestion/fevers/GI or GU c/o. Taking meds as prescribed. 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 She is feeling better now but struggled with the loss of her mother last year.   Past Medical History:  Diagnosis Date  . Abnormal results of thyroid  function studies 01/28/2013  . Allergic state 11/20/2011  . Allergy    seasonal  . Anxiety   . Cancer (HCC)    basal cell  . Chicken pox as a child  . Depression   . Depression with anxiety   . Headache(784.0) 11/20/2011  . Hematuria 01/29/2012  . Hip pain, right 01/29/2012  . Myalgia 11/20/2011  . Neck pain 05/13/2013  . Other and unspecified hyperlipidemia 05/13/2013  . Personal history of colonic adenoma 06/23/2013   06/23/2013 2 dminutive polyps    . Preventative health care 10/13/2011  . Puncture wound of thigh, right 02/03/2012  . Raynaud phenomenon 04/15/2014  . Renal lithiasis 01/29/2012  . Sinusitis acute 01/29/2012  . Sinusitis, acute 01/29/2012  . Smoker   . Vitamin D  deficiency   . Wound infection 06/20/2012    Past Surgical History:  Procedure Laterality Date  . CARPAL TUNNEL RELEASE     on right and left  . CESAREAN SECTION  1994  . COLONOSCOPY  2014  . COLONOSCOPY W/ POLYPECTOMY  2014   1 was precancerous  . LITHOTRIPSY    . POLYPECTOMY      Family History  Problem Relation Age of Onset  . Hypertension Mother   . Parkinsonism Mother   . Heart failure Mother   . Stroke Mother   . Stroke Father   . Hypertension Father   . Cancer Father        leukemia  . Colon polyps Father    . Depression Sister   . Anxiety disorder Sister   . Colon polyps Sister   . Colon polyps Brother   . Parkinsonism Maternal Grandmother   . Heart disease Maternal Grandfather   . Colon cancer Neg Hx   . Stomach cancer Neg Hx   . Esophageal cancer Neg Hx   . Rectal cancer Neg Hx     Social History   Socioeconomic History  . Marital status: Divorced    Spouse name: Not on file  . Number of children: 2  . Years of education: Not on file  . Highest education level: 12th grade  Occupational History  . Not on file  Tobacco Use  . Smoking status: Every Day    Current packs/day: 0.50    Average packs/day: 0.5 packs/day for 15.0 years (7.5 ttl pk-yrs)    Types: Cigarettes  . Smokeless tobacco: Never  . Tobacco comments:    less than a pack a day  Vaping Use  . Vaping status: Never Used  Substance and Sexual Activity  . Alcohol use: No  . Drug use: No  . Sexual activity: Not Currently    Partners: Male    Birth control/protection: None  Other Topics Concern  . Not  on file  Social History Narrative  . Not on file   Social Drivers of Health   Financial Resource Strain: Medium Risk (12/15/2022)   Overall Financial Resource Strain (CARDIA)   . Difficulty of Paying Living Expenses: Somewhat hard  Food Insecurity: No Food Insecurity (12/15/2022)   Hunger Vital Sign   . Worried About Programme researcher, broadcasting/film/video in the Last Year: Never true   . Ran Out of Food in the Last Year: Never true  Recent Concern: Food Insecurity - Food Insecurity Present (12/08/2022)   Hunger Vital Sign   . Worried About Programme researcher, broadcasting/film/video in the Last Year: Sometimes true   . Ran Out of Food in the Last Year: Patient declined  Transportation Needs: No Transportation Needs (12/15/2022)   PRAPARE - Transportation   . Lack of Transportation (Medical): No   . Lack of Transportation (Non-Medical): No  Physical Activity: Unknown (12/08/2022)   Exercise Vital Sign   . Days of Exercise per Week: Patient declined   .  Minutes of Exercise per Session: Not on file  Stress: Patient Declined (12/08/2022)   [employer] of Occupational Health - Occupational Stress Questionnaire   . Feeling of Stress : Patient declined  Social Connections: Unknown (12/08/2022)   Social Connection and Isolation Panel   . Frequency of Communication with Friends and Family: Patient declined   . Frequency of Social Gatherings with Friends and Family: Patient declined   . Attends Religious Services: Patient declined   . Active Member of Clubs or Organizations: Not on file   . Attends Banker Meetings: Not on file   . Marital Status: Patient declined  Intimate Partner Violence: Not on file    Outpatient Medications Prior to Visit  Medication Sig Dispense Refill  . aspirin 81 MG chewable tablet Chew by mouth daily.    . Biotin 10000 MCG TABS Take 1 tablet by mouth daily.    . loratadine  (CLARITIN ) 10 MG tablet Take 10 mg by mouth daily.    . meclizine  (ANTIVERT ) 25 MG tablet Take 1 tablet (25 mg total) by mouth 3 (three) times daily as needed for dizziness. 30 tablet 0  . Multiple Vitamin (MULTIVITAMIN) tablet Take 1 tablet by mouth daily.    . tiZANidine  (ZANAFLEX ) 2 MG tablet Take 0.5-2 tablets (1-4 mg total) by mouth 2 (two) times daily as needed for muscle spasms. 40 tablet 0  . ALPRAZolam  (XANAX ) 0.5 MG tablet Take 1 tablet (0.5 mg total) by mouth daily as needed for anxiety. 20 tablet 0  . citalopram  (CELEXA ) 40 MG tablet TAKE 1 TABLET BY MOUTH ONCE DAILY . APPOINTMENT REQUIRED FOR FUTURE REFILLS 90 tablet 0  . cyclobenzaprine  (FLEXERIL ) 10 MG tablet TAKE 1 TABLET BY MOUTH ONCE DAILY AT BEDTIME AS NEEDED FOR MUSCLE SPASM 30 tablet 0  . traZODone  (DESYREL ) 50 MG tablet Take 0.5-1 tablets (25-50 mg total) by mouth 2 (two) times daily. 60 tablet 0   No facility-administered medications prior to visit.    Allergies  Allergen Reactions  . Egg-Derived Products Nausea And Vomiting    cramping  . Minocycline  Nausea And Vomiting  . Neosporin [Neomycin-Bacitracin Zn-Polymyx] Itching    Inflamed, itchy   . Penicillins Hives  . Codeine     GI upset    Review of Systems  Constitutional:  Negative for fever and malaise/fatigue.  HENT:  Negative for congestion.   Eyes:  Negative for blurred vision.  Respiratory:  Negative for shortness of breath.  Cardiovascular:  Negative for chest pain, palpitations and leg swelling.  Gastrointestinal:  Negative for abdominal pain, blood in stool and nausea.  Genitourinary:  Negative for dysuria and frequency.  Musculoskeletal:  Negative for falls.  Skin:  Negative for rash.  Neurological:  Negative for dizziness, loss of consciousness and headaches.  Endo/Heme/Allergies:  Negative for environmental allergies.  Psychiatric/Behavioral:  Negative for depression. The patient is nervous/anxious and has insomnia.        Objective:    Physical Exam Constitutional:      General: She is not in acute distress.    Appearance: Normal appearance. She is well-developed. She is not toxic-appearing.  HENT:     Head: Normocephalic and atraumatic.     Right Ear: External ear normal.     Left Ear: External ear normal.     Nose: Nose normal.   Eyes:     General:        Right eye: No discharge.        Left eye: No discharge.     Conjunctiva/sclera: Conjunctivae normal.   Neck:     Thyroid : No thyromegaly.   Cardiovascular:     Rate and Rhythm: Normal rate and regular rhythm.     Heart sounds: Normal heart sounds. No murmur heard. Pulmonary:     Effort: Pulmonary effort is normal. No respiratory distress.     Breath sounds: Normal breath sounds.  Abdominal:     General: Bowel sounds are normal.     Palpations: Abdomen is soft.     Tenderness: There is no abdominal tenderness. There is no guarding.   Musculoskeletal:        General: Normal range of motion.     Cervical back: Neck supple.  Lymphadenopathy:     Cervical: No cervical adenopathy.    Skin:    General: Skin is warm and dry.   Neurological:     Mental Status: She is alert and oriented to person, place, and time.   Psychiatric:        Mood and Affect: Mood normal.        Behavior: Behavior normal.        Thought Content: Thought content normal.        Judgment: Judgment normal.    BP 106/70   Pulse (!) 58   Resp 16   Ht 5' 1 (1.549 m)   Wt 100 lb 12.8 oz (45.7 kg)   LMP 11/06/2008   SpO2 97%   BMI 19.05 kg/m  Wt Readings from Last 3 Encounters:  01/13/24 100 lb 12.8 oz (45.7 kg)  12/14/22 100 lb 9.6 oz (45.6 kg)  12/08/22 100 lb (45.4 kg)    Diabetic Foot Exam - Simple   No data filed    Lab Results  Component Value Date   WBC 7.5 01/13/2024   HGB 14.0 01/13/2024   HCT 41.5 01/13/2024   PLT 305.0 01/13/2024   GLUCOSE 84 01/13/2024   CHOL 182 01/13/2024   TRIG 117.0 01/13/2024   HDL 59.60 01/13/2024   LDLCALC 99 01/13/2024   ALT 14 01/13/2024   AST 17 01/13/2024   NA 139 01/13/2024   K 4.1 01/13/2024   CL 101 01/13/2024   CREATININE 1.00 01/13/2024   BUN 12 01/13/2024   CO2 31 01/13/2024   TSH 4.03 01/13/2024    Lab Results  Component Value Date   TSH 4.03 01/13/2024   Lab Results  Component Value Date   WBC 7.5 01/13/2024  HGB 14.0 01/13/2024   HCT 41.5 01/13/2024   MCV 93.0 01/13/2024   PLT 305.0 01/13/2024   Lab Results  Component Value Date   NA 139 01/13/2024   K 4.1 01/13/2024   CO2 31 01/13/2024   GLUCOSE 84 01/13/2024   BUN 12 01/13/2024   CREATININE 1.00 01/13/2024   BILITOT 0.3 01/13/2024   ALKPHOS 72 01/13/2024   AST 17 01/13/2024   ALT 14 01/13/2024   PROT 6.7 01/13/2024   ALBUMIN 4.7 01/13/2024   CALCIUM 9.9 01/13/2024   GFR 60.62 01/13/2024   Lab Results  Component Value Date   CHOL 182 01/13/2024   Lab Results  Component Value Date   HDL 59.60 01/13/2024   Lab Results  Component Value Date   LDLCALC 99 01/13/2024   Lab Results  Component Value Date   TRIG 117.0 01/13/2024   Lab  Results  Component Value Date   CHOLHDL 3 01/13/2024   No results found for: HGBA1C     Assessment & Plan:  Vitamin D  deficiency Assessment & Plan: Supplement and monitor  Orders: -     TSH -     VITAMIN D  25 Hydroxy (Vit-D Deficiency, Fractures)  Hyperlipidemia, unspecified hyperlipidemia type Assessment & Plan: Encourage heart healthy diet such as MIND or DASH diet, increase exercise, avoid trans fats, simple carbohydrates and processed foods, consider a krill or fish or flaxseed oil cap daily.   Orders: -     Comprehensive metabolic panel with GFR -     CBC with Differential/Platelet -     TSH -     Lipid panel  Myalgia Assessment & Plan: Hydrate and monitor   Insomnia, unspecified type Assessment & Plan: Encouraged good sleep hygiene such as dark, quiet room. No blue/green glowing lights such as computer screens in bedroom. No alcohol or stimulants in evening. Cut down on caffeine as able. Regular exercise is helpful but not just prior to bed time.  Given refill on Trazodone     Depression with anxiety Assessment & Plan: Refill given for Citalopram  which is helpful. Used only a couple of Alprazolam  right after her mom died but she is doing much better now and is still caring for her father full time.   Other orders -     traZODone  HCl; Take 1.5 tablets (75 mg total) by mouth at bedtime.  Dispense: 135 tablet; Refill: 1 -     Citalopram  Hydrobromide; Take 1 tablet (40 mg total) by mouth daily.  Dispense: 90 tablet; Refill: 1    Randie Bustle, MD

## 2024-05-19 ENCOUNTER — Telehealth: Payer: Self-pay | Admitting: Family Medicine

## 2024-05-19 NOTE — Telephone Encounter (Unsigned)
 Copied from CRM #[id number]. Topic: Clinical - Refused Triage >> May 19, 2024 12:10 PM [name] wrote: Patient/caller voiced complaints of anxiety, nerves and under a lot of stress since her mom recently passed away, patient was crying on phone with agent, agent offered to transfer patient to nurse for mental health issues and patient declined. Patient scheduled her annual physical with Dr. Domenica. Declined transfer to triage.

## 2024-06-01 ENCOUNTER — Ambulatory Visit: Payer: Self-pay | Admitting: Family Medicine

## 2024-06-01 NOTE — Progress Notes (Incomplete)
 Acute Office Visit  Subjective:  Patient ID: Autumn Myers, female    DOB: [birth year]  Age: 63 y.o. MRN: [id number]  CC: No chief complaint on file.     HPI JOCI DRESS is here for Cough.           Past Medical History:  Diagnosis Date   Abnormal results of thyroid  function studies 01/28/2013   Allergic state 11/20/2011   Allergy    seasonal   Anxiety    Cancer (HCC)    basal cell   Chicken pox as a child   Depression    Depression with anxiety    Headache(784.0) 11/20/2011   Hematuria 01/29/2012   Hip pain, right 01/29/2012   Myalgia 11/20/2011   Neck pain 05/13/2013   Other and unspecified hyperlipidemia 05/13/2013   Personal history of colonic adenoma 06/23/2013   06/23/2013 2 dminutive polyps     Preventative health care 10/13/2011   Puncture wound of thigh, right 02/03/2012   Raynaud phenomenon 04/15/2014   Renal lithiasis 01/29/2012   Sinusitis acute 01/29/2012   Sinusitis, acute 01/29/2012   Smoker    Vitamin D  deficiency    Wound infection 06/20/2012    Past Surgical History:  Procedure Laterality Date   CARPAL TUNNEL RELEASE     on right and left   CESAREAN SECTION  1994   COLONOSCOPY  2014   COLONOSCOPY W/ POLYPECTOMY  2014   1 was precancerous   LITHOTRIPSY     POLYPECTOMY      Family History  Problem Relation Age of Onset   Hypertension Mother    Parkinsonism Mother    Heart failure Mother    Stroke Mother    Stroke Father    Hypertension Father    Cancer Father        leukemia   Colon polyps Father    Depression Sister    Anxiety disorder Sister    Colon polyps Sister    Colon polyps Brother    Parkinsonism Maternal Grandmother    Heart disease Maternal Grandfather    Colon cancer Neg Hx    Stomach cancer Neg Hx    Esophageal cancer Neg Hx    Rectal cancer Neg Hx     Social History   Socioeconomic History   Marital status: Divorced    Spouse name: Not on file   Number of children: 2   Years of education: Not on file    Highest education level: 12th grade  Occupational History   Not on file  Tobacco Use   Smoking status: Every Day    Current packs/day: 0.50    Average packs/day: 0.5 packs/day for 15.0 years (7.5 ttl pk-yrs)    Types: Cigarettes   Smokeless tobacco: Never   Tobacco comments:    less than a pack a day  Vaping Use   Vaping status: Never Used  Substance and Sexual Activity   Alcohol use: No   Drug use: No   Sexual activity: Not Currently    Partners: Male    Birth control/protection: None  Other Topics Concern   Not on file  Social History Narrative   Not on file   Social Drivers of Health   Financial Resource Strain: Medium Risk (12/15/2022)   Overall Financial Resource Strain (CARDIA)    Difficulty of Paying Living Expenses: Somewhat hard  Food Insecurity: No Food Insecurity (12/15/2022)   Hunger Vital Sign    Worried About Programme Researcher, Broadcasting/film/video in  the Last Year: Never true    Ran Out of Food in the Last Year: Never true  Recent Concern: Food Insecurity - Food Insecurity Present (12/08/2022)   Hunger Vital Sign    Worried About Running Out of Food in the Last Year: Sometimes true    Ran Out of Food in the Last Year: Patient declined  Transportation Needs: No Transportation Needs (12/15/2022)   PRAPARE - Administrator, Civil Service (Medical): No    Lack of Transportation (Non-Medical): No  Physical Activity: Unknown (12/08/2022)   Exercise Vital Sign    Days of Exercise per Week: Patient declined    Minutes of Exercise per Session: Not on file  Stress: Patient Declined (12/08/2022)   [employer] of Occupational Health - Occupational Stress Questionnaire    Feeling of Stress : Patient declined  Social Connections: Unknown (12/08/2022)   Social Connection and Isolation Panel    Frequency of Communication with Friends and Family: Patient declined    Frequency of Social Gatherings with Friends and Family: Patient declined    Attends Religious Services: Patient  declined    Database Administrator or Organizations: Not on file    Attends Banker Meetings: Not on file    Marital Status: Patient declined  Intimate Partner Violence: Not on file    ROS All ROS negative except what is listed in the HPI.   Objective:   Today's Vitals: LMP 11/06/2008   Physical Exam  Assessment & Plan:   Problem List Items Addressed This Visit   None     Follow-up: No follow-ups on file.   Waddell FURY Almarie, DNP, FNP-C  I,Emily Lagle,acting as a neurosurgeon for Waddell KATHEE Almarie, NP.,have documented all relevant documentation on the behalf of Waddell KATHEE Almarie, NP,as directed by  Waddell KATHEE Almarie, NP while in the presence of Waddell KATHEE Almarie, NP.   I, Waddell KATHEE Almarie, NP, have reviewed all documentation for this visit. The documentation on 06/01/2024 for the exam, diagnosis, procedures, and orders are all accurate and complete.

## 2024-06-02 ENCOUNTER — Ambulatory Visit (INDEPENDENT_AMBULATORY_CARE_PROVIDER_SITE_OTHER): Payer: Self-pay | Admitting: Student

## 2024-06-02 ENCOUNTER — Ambulatory Visit (HOSPITAL_BASED_OUTPATIENT_CLINIC_OR_DEPARTMENT_OTHER)
Admission: RE | Admit: 2024-06-02 | Discharge: 2024-06-02 | Disposition: A | Discharge status: Home / Self Care | Payer: Self-pay | Source: Ambulatory Visit | Attending: Student | Admitting: Student

## 2024-06-02 ENCOUNTER — Other Ambulatory Visit: Payer: Self-pay | Admitting: *Deleted

## 2024-06-02 ENCOUNTER — Telehealth: Payer: Self-pay | Admitting: Family Medicine

## 2024-06-02 ENCOUNTER — Ambulatory Visit: Payer: Self-pay | Admitting: Student

## 2024-06-02 VITALS — BP 97/59 | HR 64 | Temp 97.7°F | Ht 61.0 in | Wt 106.4 lb

## 2024-06-02 DIAGNOSIS — B9689 Other specified bacterial agents as the cause of diseases classified elsewhere: Secondary | ICD-10-CM

## 2024-06-02 DIAGNOSIS — J019 Acute sinusitis, unspecified: Secondary | ICD-10-CM

## 2024-06-02 DIAGNOSIS — R051 Acute cough: Secondary | ICD-10-CM

## 2024-06-02 DIAGNOSIS — H938X2 Other specified disorders of left ear: Secondary | ICD-10-CM

## 2024-06-02 DIAGNOSIS — R0989 Other specified symptoms and signs involving the circulatory and respiratory systems: Secondary | ICD-10-CM

## 2024-06-02 DIAGNOSIS — R0602 Shortness of breath: Secondary | ICD-10-CM | POA: Insufficient documentation

## 2024-06-02 DIAGNOSIS — R0981 Nasal congestion: Secondary | ICD-10-CM

## 2024-06-02 MED ORDER — DOXYCYCLINE HYCLATE 100 MG PO TABS: 100.0000 mg | ORAL_TABLET | Freq: Two times a day (BID) | ORAL | 0 refills | Status: AC

## 2024-06-02 MED ORDER — GUAIFENESIN ER 600 MG PO TB12: 600.0000 mg | ORAL_TABLET | Freq: Two times a day (BID) | ORAL | 0 refills | Status: AC

## 2024-06-02 MED ORDER — BENZONATATE 100 MG PO CAPS: 100.0000 mg | ORAL_CAPSULE | Freq: Three times a day (TID) | ORAL | 0 refills | Status: AC | PRN

## 2024-06-02 MED ORDER — FLUTICASONE PROPIONATE 50 MCG/ACT NA SUSP: 2.0000 | Freq: Every day | NASAL | 6 refills | Status: AC

## 2024-06-02 MED ORDER — AZITHROMYCIN 250 MG PO TABS: ORAL_TABLET | ORAL | 0 refills | Status: AC

## 2024-06-02 NOTE — Telephone Encounter (Signed)
 Copied from CRM [id number]. Topic: Clinical - Prescription Issue >> Jun 02, 2024  4:02 PM Rea BROCKS wrote: Reason for CRM: doxycycline  (VIBRA -TABS) 100 MG tablet  [facility] [place], [place] - [address] [place] [address] Phone: [phone] Fax: [phone] Hours: Not open 24 hours   Patient is currently at the pharmacy right now. Patient was told by the pharmacist that if she doesn't eat when taking meds that she can vomit. Patient doesn't want to experience that with her body and she is asking if Dr. Domenica can call in a different prescription so she doesn't have to risk getting sick from this medicine.   Patient just left from appointment today.   [phone] (M)- Patient's contact

## 2024-06-02 NOTE — Progress Notes (Signed)
 No chief complaint on file.   Autumn Myers here for URI complaints.    History of Present Illness Autumn Myers is a 63 year old female who presents with persistent upper respiratory symptoms.  She has experienced upper respiratory symptoms since May 16, 2024, persisting for over two weeks. Initially improving, she then developed sneezing, a productive cough with mucus, and mild shortness of breath. Puffiness around her eyes and facial pain, especially when leaning forward or during coughing and sneezing, are present.  She smokes half a pack of cigarettes daily and takes Claritin  for allergies.  Increased fatigue is noted, with more frequent naps over the past two weeks and a feeling of being 'wiped out'.  Nasal congestion is present, affecting both her chest and nasal passages, along with a mild sore throat. No significant fevers recently, though she had a fever initially. Her cough is mostly loose, worsening at night and affecting her sleep.  She has a sore ear with a small bump at the site of an old piercing, possibly irritated by anxiety-related touching. She is not using any inhalers or other respiratory medications.  Patient denies fever, chills, SOB, CP, palpitations, dyspnea, edema, HA, vision changes, N/V/D, abdominal pain, urinary symptoms, rash, weight changes, and recent illness or hospitalizations.   Past Medical History:  Diagnosis Date   Abnormal results of thyroid  function studies 01/28/2013   Allergic state 11/20/2011   Allergy    seasonal   Anxiety    Cancer (HCC)    basal cell   Chicken pox as a child   Depression    Depression with anxiety    Headache(784.0) 11/20/2011   Hematuria 01/29/2012   Hip pain, right 01/29/2012   Myalgia 11/20/2011   Neck pain 05/13/2013   Other and unspecified hyperlipidemia 05/13/2013   Personal history of colonic adenoma 06/23/2013   06/23/2013 2 dminutive polyps     Preventative health care 10/13/2011   Puncture wound of  thigh, right 02/03/2012   Raynaud phenomenon 04/15/2014   Renal lithiasis 01/29/2012   Sinusitis acute 01/29/2012   Sinusitis, acute 01/29/2012   Smoker    Vitamin D  deficiency    Wound infection 06/20/2012    Objective BP (!) 97/59   Pulse 64   Temp 97.7 F (36.5 C)   Ht 5' 1 (1.549 m)   Wt 106 lb 6.4 oz (48.3 kg)   LMP 11/06/2008   SpO2 97%   BMI 20.10 kg/m  General: Awake, alert, appears stated age HEENT: AT, [place], ears patent b/l and TM's neg, nares patent w/o discharge, pharynx pink and without exudates, MMM Left ear- mild erythema, TWP, small nodule. No drainage.  Neck: No masses or asymmetry Heart: RRR Lungs: Diminshed, no wheezing, no accessory muscle use Psych: Age appropriate judgment and insight, normal mood and affect  SOB (shortness of breath) - Plan: DG Chest 2 View  Chest congestion - Plan: guaiFENesin  (MUCINEX ) 600 MG 12 hr tablet  Nasal congestion - Plan: guaiFENesin  (MUCINEX ) 600 MG 12 hr tablet, fluticasone  (FLONASE ) 50 MCG/ACT nasal spray  Acute cough  Irritation of left ear  Acute bacterial sinusitis - Plan: DG Chest 2 View   Acute bacterial sinusitis with acute cough and shortness of breath Symptoms persisted over two weeks with initial improvement followed by worsening. - Order chest x-ray to rule out pneumonia. - Prescribe doxycycline  100 mg twice daily. - Prescribe Mucinex  for symptomatic relief. - Ensure adequate hydration - Prescribe Flonase  for nasal congestion, Recommend saline nasal rinses. -  Advise Afrin for no more than three days if nasal congestion is severe. -RTC if no improvement, seek care if symptoms worsen or do not improve.   Irritation, left ear Left ear irritation likely due to old piercing, exacerbated by manipulation - Advise warm, moist compresses, keep area clean - Recommend over-the-counter triple antibiotic ointment Bid x 1 week; Rx Doxy  - Advise to avoid manipulating the area.  Continue to push fluids, practice good  hand hygiene, cover mouth when coughing. F/u prn. If starting to experience fevers, shaking, or worsening shortness of breath, seek immediate care. Pt voiced understanding and agreement to the plan.  Harlene LITTIE Jolly, DNP, AGNP-C 06/02/24 2:56 PM

## 2024-06-02 NOTE — Telephone Encounter (Signed)
 Per Harlene Jolly, NP change Doxycycline  to a Z-pack.  Sent to pharmacy.  Pt notified.

## 2024-06-13 ENCOUNTER — Other Ambulatory Visit: Payer: Self-pay | Admitting: Family Medicine

## 2024-07-24 ENCOUNTER — Other Ambulatory Visit: Payer: Self-pay | Admitting: Family Medicine

## 2024-08-21 ENCOUNTER — Encounter: Payer: Self-pay | Admitting: Family Medicine

## 2024-12-11 ENCOUNTER — Encounter: Payer: Self-pay | Admitting: Family Medicine

## 2024-12-13 ENCOUNTER — Encounter: Payer: Self-pay | Admitting: Student
